# Patient Record
Sex: Female | Born: 1940 | Race: Black or African American | Hispanic: No | Smoking: Current every day smoker
Health system: Southern US, Community
[De-identification: ages and names within clinical notes are randomized; demographics above are authoritative.]

## PROBLEM LIST (undated history)

## (undated) ENCOUNTER — Emergency Department (HOSPITAL_COMMUNITY): Admission: EM | Disposition: A | Discharge status: Home Health | Payer: Medicare Other

## (undated) DIAGNOSIS — G4700 Insomnia, unspecified: Secondary | ICD-10-CM

## (undated) DIAGNOSIS — K219 Gastro-esophageal reflux disease without esophagitis: Secondary | ICD-10-CM

## (undated) DIAGNOSIS — R7302 Impaired glucose tolerance (oral): Secondary | ICD-10-CM

## (undated) DIAGNOSIS — E041 Nontoxic single thyroid nodule: Secondary | ICD-10-CM

## (undated) DIAGNOSIS — M19049 Primary osteoarthritis, unspecified hand: Secondary | ICD-10-CM

## (undated) DIAGNOSIS — F411 Generalized anxiety disorder: Secondary | ICD-10-CM

## (undated) DIAGNOSIS — R079 Chest pain, unspecified: Secondary | ICD-10-CM

## (undated) DIAGNOSIS — J4489 Other specified chronic obstructive pulmonary disease: Secondary | ICD-10-CM

## (undated) DIAGNOSIS — I2510 Atherosclerotic heart disease of native coronary artery without angina pectoris: Secondary | ICD-10-CM

## (undated) DIAGNOSIS — M549 Dorsalgia, unspecified: Secondary | ICD-10-CM

## (undated) DIAGNOSIS — J209 Acute bronchitis, unspecified: Secondary | ICD-10-CM

## (undated) DIAGNOSIS — E739 Lactose intolerance, unspecified: Secondary | ICD-10-CM

## (undated) DIAGNOSIS — M79604 Pain in right leg: Secondary | ICD-10-CM

## (undated) DIAGNOSIS — R55 Syncope and collapse: Secondary | ICD-10-CM

## (undated) DIAGNOSIS — M545 Low back pain, unspecified: Secondary | ICD-10-CM

## (undated) DIAGNOSIS — I739 Peripheral vascular disease, unspecified: Secondary | ICD-10-CM

## (undated) DIAGNOSIS — R5381 Other malaise: Secondary | ICD-10-CM

## (undated) DIAGNOSIS — R42 Dizziness and giddiness: Secondary | ICD-10-CM

## (undated) DIAGNOSIS — E876 Hypokalemia: Secondary | ICD-10-CM

## (undated) DIAGNOSIS — K5900 Constipation, unspecified: Secondary | ICD-10-CM

## (undated) DIAGNOSIS — I6529 Occlusion and stenosis of unspecified carotid artery: Secondary | ICD-10-CM

## (undated) DIAGNOSIS — J449 Chronic obstructive pulmonary disease, unspecified: Secondary | ICD-10-CM

## (undated) DIAGNOSIS — M810 Age-related osteoporosis without current pathological fracture: Secondary | ICD-10-CM

## (undated) DIAGNOSIS — IMO0001 Reserved for inherently not codable concepts without codable children: Secondary | ICD-10-CM

## (undated) DIAGNOSIS — M79605 Pain in left leg: Principal | ICD-10-CM

## (undated) DIAGNOSIS — R5383 Other fatigue: Secondary | ICD-10-CM

## (undated) DIAGNOSIS — E785 Hyperlipidemia, unspecified: Secondary | ICD-10-CM

## (undated) DIAGNOSIS — J309 Allergic rhinitis, unspecified: Secondary | ICD-10-CM

## (undated) DIAGNOSIS — M79609 Pain in unspecified limb: Secondary | ICD-10-CM

## (undated) DIAGNOSIS — I10 Essential (primary) hypertension: Secondary | ICD-10-CM

## (undated) HISTORY — DX: Low back pain: M54.5

## (undated) HISTORY — DX: Hypokalemia: E87.6

## (undated) HISTORY — DX: Generalized anxiety disorder: F41.1

## (undated) HISTORY — DX: Chest pain, unspecified: R07.9

## (undated) HISTORY — DX: Allergic rhinitis, unspecified: J30.9

## (undated) HISTORY — DX: Nontoxic single thyroid nodule: E04.1

## (undated) HISTORY — DX: Essential (primary) hypertension: I10

## (undated) HISTORY — DX: Gastro-esophageal reflux disease without esophagitis: K21.9

## (undated) HISTORY — DX: Low back pain, unspecified: M54.50

## (undated) HISTORY — DX: Impaired glucose tolerance (oral): R73.02

## (undated) HISTORY — DX: Peripheral vascular disease, unspecified: I73.9

## (undated) HISTORY — DX: Age-related osteoporosis without current pathological fracture: M81.0

## (undated) HISTORY — DX: Dorsalgia, unspecified: M54.9

## (undated) HISTORY — DX: Other malaise: R53.81

## (undated) HISTORY — DX: Syncope and collapse: R55

## (undated) HISTORY — DX: Other fatigue: R53.83

## (undated) HISTORY — DX: Constipation, unspecified: K59.00

## (undated) HISTORY — PX: TONSILLECTOMY: SUR1361

## (undated) HISTORY — DX: Lactose intolerance, unspecified: E73.9

## (undated) HISTORY — PX: OTHER SURGICAL HISTORY: SHX169

## (undated) HISTORY — DX: Pain in left leg: M79.605

## (undated) HISTORY — DX: Primary osteoarthritis, unspecified hand: M19.049

## (undated) HISTORY — DX: Dizziness and giddiness: R42

## (undated) HISTORY — DX: Pain in unspecified limb: M79.609

## (undated) HISTORY — DX: Atherosclerotic heart disease of native coronary artery without angina pectoris: I25.10

## (undated) HISTORY — DX: Other specified chronic obstructive pulmonary disease: J44.89

## (undated) HISTORY — DX: Chronic obstructive pulmonary disease, unspecified: J44.9

## (undated) HISTORY — PX: CAROTID ENDARTERECTOMY: SUR193

## (undated) HISTORY — DX: Hyperlipidemia, unspecified: E78.5

## (undated) HISTORY — DX: Occlusion and stenosis of unspecified carotid artery: I65.29

## (undated) HISTORY — PX: OOPHORECTOMY: SHX86

## (undated) HISTORY — DX: Pain in right leg: M79.604

## (undated) HISTORY — DX: Acute bronchitis, unspecified: J20.9

## (undated) HISTORY — PX: APPENDECTOMY: SHX54

## (undated) HISTORY — PX: ABDOMINAL HYSTERECTOMY: SHX81

## (undated) HISTORY — PX: CARDIAC CATHETERIZATION: SHX172

## (undated) HISTORY — DX: Insomnia, unspecified: G47.00

---

## 1999-07-16 ENCOUNTER — Encounter: Payer: Self-pay | Admitting: Emergency Medicine

## 1999-07-16 ENCOUNTER — Inpatient Hospital Stay (HOSPITAL_COMMUNITY): Admission: EM | Admit: 1999-07-16 | Discharge: 1999-07-26 | Payer: Self-pay | Admitting: Emergency Medicine

## 1999-07-18 ENCOUNTER — Encounter: Payer: Self-pay | Admitting: Internal Medicine

## 2003-02-04 ENCOUNTER — Inpatient Hospital Stay (HOSPITAL_COMMUNITY): Admission: EM | Admit: 2003-02-04 | Discharge: 2003-02-06 | Payer: Self-pay | Admitting: Emergency Medicine

## 2003-06-23 ENCOUNTER — Emergency Department (HOSPITAL_COMMUNITY): Admission: EM | Admit: 2003-06-23 | Discharge: 2003-06-23 | Payer: Self-pay | Admitting: Emergency Medicine

## 2004-03-14 ENCOUNTER — Ambulatory Visit: Payer: Self-pay | Admitting: Internal Medicine

## 2005-10-05 ENCOUNTER — Ambulatory Visit: Payer: Self-pay | Admitting: Internal Medicine

## 2005-10-10 ENCOUNTER — Ambulatory Visit: Payer: Self-pay | Admitting: Internal Medicine

## 2005-11-06 ENCOUNTER — Ambulatory Visit: Payer: Self-pay | Admitting: Internal Medicine

## 2006-11-07 ENCOUNTER — Encounter: Payer: Self-pay | Admitting: *Deleted

## 2006-11-14 ENCOUNTER — Telehealth: Payer: Self-pay | Admitting: Internal Medicine

## 2007-02-12 ENCOUNTER — Ambulatory Visit: Payer: Self-pay | Admitting: Internal Medicine

## 2007-02-13 LAB — CONVERTED CEMR LAB
ALT: 14 units/L (ref 0–35)
AST: 21 units/L (ref 0–37)
Albumin: 4.1 g/dL (ref 3.5–5.2)
Alkaline Phosphatase: 58 units/L (ref 39–117)
BUN: 13 mg/dL (ref 6–23)
Calcium: 9.5 mg/dL (ref 8.4–10.5)
Chloride: 103 meq/L (ref 96–112)
Cholesterol: 253 mg/dL (ref 0–200)
Eosinophils Absolute: 0.2 10*3/uL (ref 0.0–0.6)
GFR calc Af Amer: 81 mL/min
GFR calc non Af Amer: 67 mL/min
HDL: 104.4 mg/dL (ref >39.0)
Lymphocytes Relative: 22.8 % (ref 12.0–46.0)
MCV: 91 fL (ref 78.0–100.0)
Monocytes Relative: 7.4 % (ref 3.0–11.0)
Neutro Abs: 4.8 10*3/uL (ref 1.4–7.7)
Platelets: 180 10*3/uL (ref 150–400)
RBC: 4.48 M/uL (ref 3.87–5.11)
Triglycerides: 97 mg/dL (ref 0–149)
VLDL: 19 mg/dL (ref 0–40)
WBC: 7.1 10*3/uL (ref 4.5–10.5)

## 2007-02-14 LAB — CONVERTED CEMR LAB: Vit D, 1,25-Dihydroxy: 21 — ABNORMAL LOW (ref 30–89)

## 2007-02-20 ENCOUNTER — Ambulatory Visit: Payer: Self-pay

## 2007-02-20 ENCOUNTER — Encounter: Payer: Self-pay | Admitting: Cardiology

## 2007-02-20 ENCOUNTER — Encounter: Payer: Self-pay | Admitting: Internal Medicine

## 2007-02-20 LAB — CONVERTED CEMR LAB
BUN: 14 mg/dL (ref 6–23)
Calcium: 10 mg/dL (ref 8.4–10.5)
Chloride: 94 meq/L — ABNORMAL LOW (ref 96–112)
Creatinine, Ser: 0.9 mg/dL (ref 0.4–1.2)
Potassium: 3.6 meq/L (ref 3.5–5.1)

## 2007-02-24 ENCOUNTER — Telehealth: Payer: Self-pay | Admitting: Internal Medicine

## 2007-02-26 ENCOUNTER — Encounter: Payer: Self-pay | Admitting: Internal Medicine

## 2007-02-26 ENCOUNTER — Ambulatory Visit: Payer: Self-pay | Admitting: Vascular Surgery

## 2007-03-02 HISTORY — PX: OTHER SURGICAL HISTORY: SHX169

## 2007-03-04 ENCOUNTER — Encounter: Payer: Self-pay | Admitting: Vascular Surgery

## 2007-03-04 ENCOUNTER — Ambulatory Visit: Payer: Self-pay | Admitting: Vascular Surgery

## 2007-03-04 ENCOUNTER — Inpatient Hospital Stay (HOSPITAL_COMMUNITY): Admission: RE | Admit: 2007-03-04 | Discharge: 2007-03-05 | Payer: Self-pay | Admitting: Vascular Surgery

## 2007-04-04 ENCOUNTER — Encounter: Payer: Self-pay | Admitting: Internal Medicine

## 2007-04-04 ENCOUNTER — Ambulatory Visit: Payer: Self-pay | Admitting: Vascular Surgery

## 2007-04-10 ENCOUNTER — Encounter: Payer: Self-pay | Admitting: Internal Medicine

## 2007-04-10 ENCOUNTER — Ambulatory Visit: Payer: Self-pay | Admitting: Internal Medicine

## 2007-05-08 ENCOUNTER — Ambulatory Visit: Payer: Self-pay | Admitting: Internal Medicine

## 2007-06-09 ENCOUNTER — Telehealth (INDEPENDENT_AMBULATORY_CARE_PROVIDER_SITE_OTHER): Payer: Self-pay | Admitting: *Deleted

## 2007-08-04 ENCOUNTER — Ambulatory Visit: Payer: Self-pay | Admitting: Internal Medicine

## 2007-08-04 LAB — CONVERTED CEMR LAB
AST: 22 units/L (ref 0–37)
Albumin: 4 g/dL (ref 3.5–5.2)
Cholesterol: 202 mg/dL (ref 0–200)
Direct LDL: 66.2 mg/dL
HDL: 111.4 mg/dL (ref >39.0)
Total CHOL/HDL Ratio: 1.8
Triglycerides: 56 mg/dL (ref 0–149)

## 2007-09-24 ENCOUNTER — Telehealth (INDEPENDENT_AMBULATORY_CARE_PROVIDER_SITE_OTHER): Payer: Self-pay | Admitting: *Deleted

## 2007-09-26 ENCOUNTER — Inpatient Hospital Stay (HOSPITAL_COMMUNITY): Admission: EM | Admit: 2007-09-26 | Discharge: 2007-09-27 | Payer: Self-pay | Admitting: Emergency Medicine

## 2007-09-26 ENCOUNTER — Ambulatory Visit: Payer: Self-pay | Admitting: Internal Medicine

## 2007-10-10 ENCOUNTER — Ambulatory Visit: Payer: Self-pay | Admitting: Internal Medicine

## 2007-10-10 LAB — CONVERTED CEMR LAB
ALT: 17 units/L (ref 0–35)
AST: 23 units/L (ref 0–37)
Bilirubin, Direct: 0.1 mg/dL (ref 0.0–0.3)
Cholesterol: 198 mg/dL (ref 0–200)
HDL: 75.7 mg/dL (ref >39.0)
Total Bilirubin: 0.6 mg/dL (ref 0.3–1.2)

## 2007-10-17 ENCOUNTER — Ambulatory Visit: Payer: Self-pay | Admitting: Vascular Surgery

## 2007-11-04 ENCOUNTER — Ambulatory Visit: Payer: Self-pay | Admitting: Internal Medicine

## 2007-11-27 ENCOUNTER — Telehealth (INDEPENDENT_AMBULATORY_CARE_PROVIDER_SITE_OTHER): Payer: Self-pay | Admitting: *Deleted

## 2007-12-05 ENCOUNTER — Ambulatory Visit: Payer: Self-pay | Admitting: Internal Medicine

## 2007-12-05 LAB — CONVERTED CEMR LAB
AST: 25 units/L (ref 0–37)
Alkaline Phosphatase: 46 units/L (ref 39–117)
Chloride: 105 meq/L (ref 96–112)
Cholesterol: 170 mg/dL (ref 0–200)
GFR calc non Af Amer: 48 mL/min
LDL Cholesterol: 78 mg/dL (ref 0–99)
Potassium: 3.9 meq/L (ref 3.5–5.1)
Total Bilirubin: 0.4 mg/dL (ref 0.3–1.2)
Total CHOL/HDL Ratio: 2.1

## 2008-02-17 ENCOUNTER — Telehealth: Payer: Self-pay | Admitting: Internal Medicine

## 2008-03-04 ENCOUNTER — Telehealth (INDEPENDENT_AMBULATORY_CARE_PROVIDER_SITE_OTHER): Payer: Self-pay | Admitting: *Deleted

## 2008-03-04 ENCOUNTER — Ambulatory Visit: Payer: Self-pay | Admitting: Internal Medicine

## 2008-04-22 ENCOUNTER — Ambulatory Visit: Payer: Self-pay | Admitting: Internal Medicine

## 2008-12-10 ENCOUNTER — Ambulatory Visit: Payer: Self-pay | Admitting: Vascular Surgery

## 2009-02-01 ENCOUNTER — Ambulatory Visit: Payer: Self-pay | Admitting: Internal Medicine

## 2009-02-01 LAB — CONVERTED CEMR LAB: HDL goal, serum: 40 mg/dL

## 2009-02-03 LAB — CONVERTED CEMR LAB
Albumin: 3.8 g/dL (ref 3.5–5.2)
Alkaline Phosphatase: 62 units/L (ref 39–117)
Basophils Relative: 0.8 % (ref 0.0–3.0)
Bilirubin Urine: NEGATIVE
CO2: 30 meq/L (ref 19–32)
Chloride: 108 meq/L (ref 96–112)
Direct LDL: 77.3 mg/dL
Eosinophils Absolute: 0.2 10*3/uL (ref 0.0–0.7)
HDL: 118.6 mg/dL (ref >39.00)
Hemoglobin, Urine: NEGATIVE
Hemoglobin: 14 g/dL (ref 12.0–15.0)
Iron: 96 ug/dL (ref 42–145)
Lymphocytes Relative: 23.9 % (ref 12.0–46.0)
MCHC: 33.1 g/dL (ref 30.0–36.0)
MCV: 94.5 fL (ref 78.0–100.0)
Monocytes Absolute: 0.5 10*3/uL (ref 0.1–1.0)
Neutro Abs: 3.6 10*3/uL (ref 1.4–7.7)
RBC: 4.46 M/uL (ref 3.87–5.11)
Saturation Ratios: 29.1 % (ref 20.0–50.0)
Sodium: 143 meq/L (ref 135–145)
Total CHOL/HDL Ratio: 2
Total Protein, Urine: NEGATIVE mg/dL
Total Protein: 7 g/dL (ref 6.0–8.3)
Urine Glucose: NEGATIVE mg/dL
Urobilinogen, UA: 0.2 (ref 0.0–1.0)
VLDL: 24 mg/dL (ref 0.0–40.0)
Vitamin B-12: 251 pg/mL (ref 211–911)

## 2009-02-18 ENCOUNTER — Ambulatory Visit: Payer: Self-pay

## 2009-02-18 ENCOUNTER — Encounter: Payer: Self-pay | Admitting: Internal Medicine

## 2009-03-02 ENCOUNTER — Encounter: Admission: RE | Admit: 2009-03-02 | Discharge: 2009-03-02 | Payer: Self-pay | Admitting: Internal Medicine

## 2009-03-02 ENCOUNTER — Encounter: Payer: Self-pay | Admitting: Internal Medicine

## 2009-03-03 ENCOUNTER — Telehealth (INDEPENDENT_AMBULATORY_CARE_PROVIDER_SITE_OTHER): Payer: Self-pay | Admitting: *Deleted

## 2009-03-03 ENCOUNTER — Ambulatory Visit: Payer: Self-pay | Admitting: Internal Medicine

## 2009-03-17 ENCOUNTER — Telehealth (INDEPENDENT_AMBULATORY_CARE_PROVIDER_SITE_OTHER): Payer: Self-pay | Admitting: *Deleted

## 2009-03-17 ENCOUNTER — Encounter: Payer: Self-pay | Admitting: Internal Medicine

## 2009-03-26 ENCOUNTER — Emergency Department (HOSPITAL_COMMUNITY): Admission: EM | Admit: 2009-03-26 | Discharge: 2009-03-26 | Payer: Self-pay | Admitting: Emergency Medicine

## 2009-03-28 ENCOUNTER — Telehealth: Payer: Self-pay | Admitting: Internal Medicine

## 2009-03-29 HISTORY — PX: OTHER SURGICAL HISTORY: SHX169

## 2009-05-10 ENCOUNTER — Encounter: Payer: Self-pay | Admitting: Internal Medicine

## 2009-12-05 ENCOUNTER — Ambulatory Visit: Payer: Self-pay | Admitting: Internal Medicine

## 2010-01-12 ENCOUNTER — Telehealth (INDEPENDENT_AMBULATORY_CARE_PROVIDER_SITE_OTHER): Payer: Self-pay | Admitting: *Deleted

## 2010-02-06 ENCOUNTER — Ambulatory Visit: Admit: 2010-02-06 | Payer: Self-pay | Admitting: Internal Medicine

## 2010-02-19 ENCOUNTER — Encounter: Payer: Self-pay | Admitting: Internal Medicine

## 2010-03-01 ENCOUNTER — Encounter: Payer: Self-pay | Admitting: Internal Medicine

## 2010-03-01 ENCOUNTER — Other Ambulatory Visit: Payer: Self-pay | Admitting: Internal Medicine

## 2010-03-01 ENCOUNTER — Telehealth (INDEPENDENT_AMBULATORY_CARE_PROVIDER_SITE_OTHER): Payer: Self-pay | Admitting: *Deleted

## 2010-03-01 DIAGNOSIS — E041 Nontoxic single thyroid nodule: Secondary | ICD-10-CM

## 2010-03-02 NOTE — Progress Notes (Signed)
Summary: CALL  Phone Note Call from Patient   Summary of Call: Pt left vm, she went to the ER and would like a call back.  Initial call taken by: Lamar Sprinkles, CMA,  March 28, 2009 9:56 AM  Follow-up for Phone Call        pt wanted to let MD know that she was seen in [facility] ER over the weekend for viral infection that caused N&V with diarrhea. Follow-up by: Margaret Pyle, CMA,  March 28, 2009 10:23 AM  Additional Follow-up for Phone Call Additional follow up Details #1::        noted; please call or make OV for any worsening related  symptoms Additional Follow-up by: Corwin Levins MD,  March 28, 2009 12:05 PM

## 2010-03-02 NOTE — Miscellaneous (Signed)
Summary: Orders Update  Clinical Lists Changes  Orders: Added new Referral order of Misc. Referral (Misc. Ref) - Signed 

## 2010-03-02 NOTE — Assessment & Plan Note (Signed)
Summary: FU / REFILLS / LEG PROBLEMS/NWS #   Vital Signs:  Patient profile:   70 year old female Height:      66 inches Weight:      124 pounds BMI:     20.09 O2 Sat:      99 % on Room air Temp:     98 degrees F oral Pulse rate:   73 / minute BP sitting:   198 / 90  (left arm) Cuff size:   regular  Vitals Entered ByZella Ball Ewing (February 01, 2009 8:41 AM)  O2 Flow:  Room air  Preventive Care Screening  Bone Density:    Date:  03/31/2007    Next Due:  03/2009    Results:  abnormal std dev  Last Tetanus Booster:    Date:  02/01/2009    Results:  Td  Last Flu Shot:    Date:  02/01/2009    Results:  Fluvax 3+  CC: followup on medication/RE, Lipid Management   CC:  followup on medication/RE and Lipid Management.  History of Present Illness: saw [facility] orthopedic with severe BP;  gained just a few lbs, has ongoing pain to the first finger right hand only and no other pain but pain overall still quite significant;  had cortisone shot to joint (dr Maylon Peppers);  has not yet set up her dxa as previsously discussed;  wants to do now as she has had more lower back pain, "across the back" , worse for 1 -2 wks;  about 5/10, worse to stand up , bend and twise;  no bowel or bladder changes, fever, wt loss, night sweats and no GI or GU ymptoms, and no LE pain, weakness , or numbness. After discussion today, we find she is taking the clonidine only, on her own, and not taking the hctz and toprol due to "so many pills" and cost.  she' not sure if she is taking the simvatstatin as well, and does not appear to have signifcant coginitive difficulty today or memory issues.   Does need pain med (darvocet no longer on the [place] market) for the chronic recurrent leg pains at rest.  also having leg pain with walking, and not clearly not the knees , but points to the legs below the knees, with some stiffness that also make it hard to get up in the AM.  Pt denies CP, sob, doe, wheezing, orthopnea, pnd,  worsening LE edema, palps, dizziness or syncope   Pt denies new neuro symptoms such as headache, facial or extremity weakness   Lipid Management History:      Positive NCEP/ATP III risk factors include female age 15 years old or older, current tobacco user, hypertension, ASHD (either angina/prior MI/prior CABG), and peripheral vascular disease.  Negative NCEP/ATP III risk factors include HDL cholesterol greater than 60.    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) 3)  Glucose Intolerance  (ICD-271.3) 4)  Hypokalemia  (ICD-276.8) 5)  Peripheral Vascular Disease  (ICD-443.9) 6)  Coronary Artery Disease  (ICD-414.00) 7)  COPD  (ICD-496) 8)  Insomnia-sleep Disorder-unspec  (ICD-780.52) 9)  Hepatotoxicity, Drug-induced, Risk of  (ICD-V58.69) 10)  Carotid Artery Stenosis, Bilateral  (ICD-433.10) 11)  Allergic Rhinitis  (ICD-477.9) 12)  Hyperlipidemia  (ICD-272.4) 13)  Fatigue  (ICD-780.79) 14)  Depression  (ICD-311) 15)  Anxiety  (ICD-300.00) 16)  Osteoporosis  (ICD-733.00) 17)  Gerd  (ICD-530.81) 18)  Constipation  (ICD-564.00)  19)  Claudication  (ICD-443.9) 20)  Dizziness  (ICD-780.4) 21)  Chest Pain  (ICD-786.50) 22)  Hypertension  (ICD-401.9)  Medications Prior to Update: 1)  Toprol Xl 50 Mg Xr24h-Tab (Metoprolol Succinate) .Marland Kitchen.. 1 By Mouth Once Daily 2)  Hydrochlorothiazide 25 Mg Tabs (Hydrochlorothiazide) .... Take 1 Tablet By Mouth Once A Day 3)  Meloxicam 15 Mg Tabs (Meloxicam) .Marland Kitchen.. 1 By Mouth Once Daily As Needed 4)  Ecotrin Low Strength 81 Mg  Tbec (Aspirin) .Marland Kitchen.. 1po Qd 5)  Omeprazole 20 Mg  Cpdr (Omeprazole) .... 2po Qd 6)  Simvastatin 80 Mg Tabs (Simvastatin) .Marland Kitchen.. 1 By Mouth Once Daily 7)  Vitamin D 1000 Unit  Tabs (Cholecalciferol) .Marland Kitchen.. 1po Qd 8)  Alprazolam 0.25 Mg  Tabs (Alprazolam) .Marland Kitchen.. 1 By Mouth Two Times A Day Prn 9)  Alendronate Sodium 70 Mg  Tabs (Alendronate Sodium) .Marland Kitchen.. 1 By Mouth  Q Wk 10)  Zolpidem Tartrate 10 Mg  Tabs (Zolpidem Tartrate) .Marland Kitchen.. 1 By Mouth At Bedtime As Needed 11)  Citalopram Hydrobromide 10 Mg  Tabs (Citalopram Hydrobromide) .Marland Kitchen.. 1 By Mouth Once Daily 12)  Clonidine Hcl 0.2 Mg Tabs (Clonidine Hcl) .Marland Kitchen.. 1 By Mouth Two Times A Day 13)  Cephalexin 500 Mg Caps (Cephalexin) .Marland Kitchen.. 1 By Mouth Three Times A Day 14)  Tessalon Perles 100 Mg Caps (Benzonatate) .Marland Kitchen.. 1 - 2 By Mouth Three Times A Day As Needed 15)  Prednisone 10 Mg Tabs (Prednisone) .... 3po Qd For 3days, Then 2po Qd For 3days, Then 1po Qd For 3days, Then Stop  Current Medications (verified): 1)  Atenolol-Chlorthalidone 50-25 Mg Tabs (Atenolol-Chlorthalidone) .Marland Kitchen.. 1po Once Daily 2)  Tramadol Hcl 50 Mg Tabs (Tramadol Hcl) .Marland Kitchen.. 1po Q 6 Hrs As Needed Pain 3)  Ecotrin Low Strength 81 Mg  Tbec (Aspirin) .Marland Kitchen.. 1po Qd 4)  Omeprazole 20 Mg  Cpdr (Omeprazole) .... 2 By Mouth Once Daily 5)  Simvastatin 40 Mg Tabs (Simvastatin) .Marland Kitchen.. 1po Once Daily 6)  Vitamin D 1000 Unit  Tabs (Cholecalciferol) .Marland Kitchen.. 1po Qd 7)  Alprazolam 0.25 Mg  Tabs (Alprazolam) .Marland Kitchen.. 1 By Mouth Two Times A Day Prn 8)  Alendronate Sodium 70 Mg  Tabs (Alendronate Sodium) .Marland Kitchen.. 1 By Mouth Q Wk 9)  Zolpidem Tartrate 10 Mg  Tabs (Zolpidem Tartrate) .Marland Kitchen.. 1 By Mouth At Bedtime As Needed 10)  Clonidine Hcl 0.2 Mg Tabs (Clonidine Hcl) .Marland Kitchen.. 1 By Mouth Two Times A Day  Allergies (verified): No Known Drug Allergies  Past History:  Past Medical History: Last updated: 11/04/2007 HYPERTENSION (ICD-401.9) GERD Osteoporosis Anxiety Depression Hyperlipidemia left knee djd non-compliance Allergic rhinitis COPD Coronary artery disease - nonobstructive by cath 9/09 glucose intolerance Peripheral vascular disease  - left carotid 60-80% 9/09  Past Surgical History: Last updated: 11/04/2007 s/p small bowell obstruction Hysterectomy Appendectomy Oophorectomy Tonsillectomy s/p right CEA - 2/09  Family History: Last updated:  03/04/2008 stroke MI alcoholism HTN DM son with colon cancer  Social History: Last updated: 11/04/2007 Current Smoker Alcohol use-no widow 2 children 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   Head:  normocephalic and atraumatic.   Eyes:  vision grossly intact, pupils equal, and pupils round.   Ears:  R ear normal and L ear normal.  Nose:  no external deformity and no nasal discharge.   Mouth:  no gingival abnormalities and pharynx pink and moist.   Neck:  supple and no masses.   Lungs:  normal respiratory effort, R decreased breath sounds, and L decreased breath sounds.   Heart:  normal rate and regular rhythm.   Msk:  no joint tenderness and no joint swelling.   Pulses:  trace to 1+ bilat LE dorsalis pedis Extremities:  no edema, no erythema  Neurologic:  cranial nerves II-XII intact and strength normal in all extremities.     Impression & Recommendations:  Problem # 1:  HYPERTENSION (ICD-401.9)  The following medications were removed from the medication list:    Toprol Xl 50 Mg Xr24h-tab (Metoprolol succinate) .Marland Kitchen... 1 by mouth once daily Her updated medication list for this problem includes:    Atenolol-chlorthalidone 50-25 Mg Tabs (Atenolol-chlorthalidone) .Marland Kitchen... 1po once daily    Clonidine Hcl 0.2 Mg Tabs (Clonidine hcl) .Marland Kitchen... 1 by mouth two times a day persistent elev due in part to noncompliance with meds; meds adjusted to hopefully meds she will continue;  urged compliacne, to chekc renal artery u/s, and f/u 4 wks  Orders: Radiology Referral (Radiology)  Problem # 2:  FATIGUE (ICD-780.79)  /exam benign, to check labs below; follow with expectant management    Orders: TLB-BMP (Basic Metabolic Panel-BMET) (80048-METABOL) TLB-CBC Platelet - w/Differential (85025-CBCD) TLB-Hepatic/Liver Function Pnl (80076-HEPATIC) TLB-TSH (Thyroid Stimulating Hormone) (84443-TSH) TLB-Sedimentation Rate (ESR) (85652-ESR) TLB-IBC Pnl (Iron/FE;Transferrin) (83550-IBC) TLB-B12 + Folate Pnl (82746_82607-B12/FOL) TLB-Udip ONLY (81003-UDIP)  Problem # 3:  LEG PAIN, BILATERAL (ICD-729.5)  not primarily joint related it seems; will check LE dopplers, but also consider EMG/NCS  Orders: Radiology Referral (Radiology)  Problem # 4:  LOW BACK PAIN, CHRONIC (ICD-724.2)  Her updated medication list for this problem includes:    Tramadol Hcl 50 Mg Tabs (Tramadol hcl) .Marland Kitchen... 1po q 6 hrs as needed pain    Ecotrin Low Strength 81 Mg Tbec (Aspirin) .Marland Kitchen... 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 1po once daily 2)  Tramadol Hcl 50 Mg Tabs (Tramadol hcl) .Marland Kitchen.. 1po q 6 hrs as needed pain 3)  Ecotrin Low Strength 81 Mg Tbec (Aspirin) .Marland Kitchen.. 1po qd 4)  Omeprazole 20 Mg Cpdr (Omeprazole) .... 2 by mouth once daily 5)  Simvastatin 40 Mg Tabs (Simvastatin) .Marland Kitchen.. 1po once daily 6)  Vitamin D 1000 Unit Tabs (Cholecalciferol) .Marland Kitchen.. 1po qd 7)  Alprazolam 0.25 Mg Tabs (Alprazolam) .Marland Kitchen.. 1 by mouth two times a day prn 8)  Alendronate Sodium 70 Mg Tabs (Alendronate sodium) .Marland Kitchen.. 1 by mouth q wk 9)  Zolpidem Tartrate 10 Mg Tabs (Zolpidem tartrate) .Marland Kitchen.. 1 by mouth at  bedtime as needed 10)  Clonidine Hcl 0.2 Mg Tabs (Clonidine hcl) .Marland Kitchen.. 1 by mouth two times a day  Other Orders: Flu Vaccine 70yrs + (16109) Administration Flu vaccine - MCR (G0008) TD Toxoids IM 7 YR + (60454) Admin 1st Vaccine (09811) Admin of Any Addtl Vaccine (  16606)  Lipid Assessment/Plan:      Based on NCEP/ATP III, the patient's risk factor category is "history of coronary disease, peripheral vascular disease, cerebrovascular disease, or aortic aneurysm along with either diabetes, current smoker, or LDL > 130 plus HDL < 40 plus triglycerides > 200".  The patient's lipid goals are as follows: Total cholesterol goal is 200; LDL cholesterol goal is 70; HDL cholesterol goal is 40; Triglyceride goal is 150.     Patient Instructions: 1)  you had the flu shot and the tetanus shot 2)  Please go to the Lab in the basement for your blood and/or urine tests today 3)  You will be contacted about the referral(s) to: Thyroid u/s, renal artery u/s, and the leg arteries u/s 4)  Please take all new medications as prescribed  5)  Continue all previous medications as before this visit  (see the list below and make sure you understand and take all mediciations; call if you have questions) 6)  most of your medications were refilled today with one month refills; call if you need the other medications refilled as well 7)  Please schedule a follow-up appointment in 1 month. Prescriptions: CLONIDINE HCL 0.2 MG TABS (CLONIDINE HCL) 1 by mouth two times a day  #60 x 11   Entered and Authorized by:   Corwin Levins MD   Signed by:   Corwin Levins MD on 02/01/2009   Method used:   Print then Give to Patient   RxID:   [id number] ALENDRONATE SODIUM 70 MG  TABS (ALENDRONATE SODIUM) 1 by mouth q wk  #5 x 11   Entered and Authorized by:   Corwin Levins MD   Signed by:   Corwin Levins MD on 02/01/2009   Method used:   Print then Give to Patient   RxID:   [id number] SIMVASTATIN 40 MG TABS (SIMVASTATIN) 1po  once daily  #30 x 11   Entered and Authorized by:   Corwin Levins MD   Signed by:   Corwin Levins MD on 02/01/2009   Method used:   Print then Give to Patient   RxID:   [id number] OMEPRAZOLE 20 MG  CPDR (OMEPRAZOLE) 2 by mouth once daily  #60 x 11   Entered and Authorized by:   Corwin Levins MD   Signed by:   Corwin Levins MD on 02/01/2009   Method used:   Print then Give to Patient   RxID:   [id number] TRAMADOL HCL 50 MG TABS (TRAMADOL HCL) 1po q 6 hrs as needed pain  #120 x 2   Entered and Authorized by:   Corwin Levins MD   Signed by:   Corwin Levins MD on 02/01/2009   Method used:   Print then Give to Patient   RxID:   [id number] ATENOLOL-CHLORTHALIDONE 50-25 MG TABS (ATENOLOL-CHLORTHALIDONE) 1po once daily  #30 x 11   Entered and Authorized by:   Corwin Levins MD   Signed by:   Corwin Levins MD on 02/01/2009   Method used:   Print then Give to Patient   RxID:   [id number]    Flu Vaccine Consent Questions     Do you have a history of severe allergic reactions to this vaccine? no    Any prior history of allergic reactions to egg and/or gelatin? no    Do you have a sensitivity to the preservative Thimersol? no    Do you have a  past history of Guillan-Barre Syndrome? no    Do you currently have an acute febrile illness? no    Have you ever had a severe reaction to latex? no    Vaccine information given and explained to patient? yes    Are you currently pregnant? no    Lot Number:[id number]   Exp Date:07/28/2009   Site Given  Left Deltoid IMdflu  Immunizations Administered:  Tetanus Vaccine:    Vaccine Type: Td    Site: right deltoid    Mfr: Sanofi Pasteur    Dose: 0.5 ml    Route: IM    Given by: Zella Ball Ewing    Exp. Date: 11/23/2010    Lot #: [id number]    VIS given: 12/17/06 version given February 01, 2009.

## 2010-03-02 NOTE — Assessment & Plan Note (Signed)
Summary: FU [name]   Vital Signs:  Patient profile:   70 year old female Height:      64 inches Weight:      117.13 pounds BMI:     20.18 O2 Sat:      98 % on Room air Temp:     98.6 degrees F oral Pulse rate:   74 / minute BP sitting:   130 / 62  (left arm) Cuff size:   regular  Vitals Entered By: Zella Ball Ewing CMA Duncan Dull) (December 05, 2009 9:17 AM)  O2 Flow:  Room air CC: followup/RE   CC:  followup/RE.  History of Present Illness: here with worsening pain to the right knee and calf area , graudually worse over past yr, now to the point in the past few months she has several instanaces of giving away, but no falls, alos with right lower back pain, and left shoulder pain on different days and not clear the back is related to the lower right leg pain;  no bowel or bladder changes, no falls, No fever, wt loss, night sweats, loss of appetite or other constitutional symptoms  Tramadol not working for pain, alhtough does make her sleepy.  needs jury duty letter due to back and leg pain ongoing  BP has been elevated at home  150's;  worse with stress, more stress lately  and panic attacks several lately where she had to stop to catch her breath , only in a stressful situaations related to sons behavior and ETOH use.  Pt denies other CP, worsening sob, doe, wheezing, orthopnea, pnd, worsening LE edema, palps, dizziness or syncope Pt denies new neuro symptoms such as headache, facial or extremity weakness  Pt denies polydipsia, polyuria, or depressive symtpoms or suicidal ideation.   Preventive Screening-Counseling & Management      Drug Use:  no.    Problems Prior to Update: 1)  Back Pain, Right  (ICD-724.5) 2)  Preventive Health Care  (ICD-V70.0) 3)  Preventive Health Care  (ICD-V70.0) 4)  Thyroid Nodule, Right  (ICD-241.0) 5)  Low Back Pain, Chronic  (ICD-724.2) 6)  Leg Pain, Bilateral  (ICD-729.5) 7)  Fatigue  (ICD-780.79) 8)  Asthmatic Bronchitis, Acute  (ICD-466.0) 9)   Degenerative Joint Disease, Fingers  (ICD-715.94) 10)  Glucose Intolerance  (ICD-271.3) 11)  Hypokalemia  (ICD-276.8) 12)  Peripheral Vascular Disease  (ICD-443.9) 13)  Coronary Artery Disease  (ICD-414.00) 14)  COPD  (ICD-496) 15)  Insomnia-sleep Disorder-unspec  (ICD-780.52) 16)  Hepatotoxicity, Drug-induced, Risk of  (ICD-V58.69) 17)  Carotid Artery Stenosis, Bilateral  (ICD-433.10) 18)  Allergic Rhinitis  (ICD-477.9) 19)  Hyperlipidemia  (ICD-272.4) 20)  Fatigue  (ICD-780.79) 21)  Depression  (ICD-311) 22)  Anxiety  (ICD-300.00) 23)  Osteoporosis  (ICD-733.00) 24)  Gerd  (ICD-530.81) 25)  Constipation  (ICD-564.00) 26)  Claudication  (ICD-443.9) 27)  Dizziness  (ICD-780.4) 28)  Chest Pain  (ICD-786.50) 29)  Hypertension  (ICD-401.9)  Medications Prior to Update: 1)  Atenolol-Chlorthalidone 50-25 Mg Tabs (Atenolol-Chlorthalidone) .Marland Kitchen.. 1po Once Daily 2)  Tramadol Hcl 50 Mg Tabs (Tramadol Hcl) .Marland Kitchen.. 1po Q 6 Hrs As Needed Pain 3)  Ecotrin Low Strength 81 Mg  Tbec (Aspirin) .Marland Kitchen.. 1po Qd 4)  Omeprazole 20 Mg  Cpdr (Omeprazole) .... 2 By Mouth Once Daily 5)  Simvastatin 40 Mg Tabs (Simvastatin) .Marland Kitchen.. 1po Once Daily 6)  Vitamin D 1000 Unit  Tabs (Cholecalciferol) .Marland Kitchen.. 1po Qd 7)  Alprazolam 0.25 Mg  Tabs (Alprazolam) .Marland Kitchen.. 1 By Mouth Two  Times A Day Prn 8)  Alendronate Sodium 70 Mg  Tabs (Alendronate Sodium) .Marland Kitchen.. 1 By Mouth Q Wk 9)  Zolpidem Tartrate 10 Mg  Tabs (Zolpidem Tartrate) .Marland Kitchen.. 1 By Mouth At Bedtime As Needed 10)  Clonidine Hcl 0.2 Mg Tabs (Clonidine Hcl) .Marland Kitchen.. 1 By Mouth Two Times A Day  Current Medications (verified): 1)  Atenolol-Chlorthalidone 50-25 Mg Tabs (Atenolol-Chlorthalidone) .Marland Kitchen.. 1po Once Daily 2)  Hydrocodone-Acetaminophen 5-325 Mg Tabs (Hydrocodone-Acetaminophen) .Marland Kitchen.. 1po Q 6 Hrs As Needed Pain 3)  Ecotrin Low Strength 81 Mg  Tbec (Aspirin) .Marland Kitchen.. 1po Qd 4)  Simvastatin 40 Mg Tabs (Simvastatin) .Marland Kitchen.. 1po Once Daily 5)  Vitamin D 1000 Unit  Tabs (Cholecalciferol) .Marland Kitchen..  1po Qd 6)  Alprazolam 0.25 Mg  Tabs (Alprazolam) .Marland Kitchen.. 1 By Mouth Two Times A Day As Needed 7)  Alendronate Sodium 70 Mg  Tabs (Alendronate Sodium) .Marland Kitchen.. 1 By Mouth Q Wk 8)  Zolpidem Tartrate 10 Mg  Tabs (Zolpidem Tartrate) .Marland Kitchen.. 1 By Mouth At Bedtime As Needed 9)  Clonidine Hcl 0.2 Mg Tabs (Clonidine Hcl) .Marland Kitchen.. 1 By Mouth Two Times A Day 10)  Citalopram Hydrobromide 10 Mg Tabs (Citalopram Hydrobromide) .Marland Kitchen.. 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   Head:  normocephalic and atraumatic.   Eyes:  vision grossly intact, pupils equal, and pupils round.   Ears:  R ear normal and L ear normal.   Nose:  no external deformity and no nasal discharge.   Mouth:  no gingival abnormalities and pharynx pink and moist.   Neck:  supple and no masses.   Lungs:  normal respiratory effort, R decreased breath sounds, and L decreased breath sounds.   Heart:  normal rate and  regular rhythm.   Msk:  no joint tenderness and no joint swelling.  but bony chronic right knee changes noted;  spine nontender today and no paravertebral tender Extremities:  no edema, no erythema  Neurologic:  strength normal in all extremities and DTRs symmetrical and normal.   Skin:  no rashes.   Psych:  not depressed appearing and moderately anxious.     Impression & Recommendations:  Problem # 1:  BACK PAIN, RIGHT (ICD-724.5)  Her updated medication list for this problem includes:    Hydrocodone-acetaminophen 5-325 Mg Tabs (Hydrocodone-acetaminophen) .Marland Kitchen... 1po q 6 hrs as needed pain    Ecotrin Low Strength 81 Mg Tbec (Aspirin) .Marland Kitchen... 1po qd and right leg giving away - for ortho referral  Orders: Orthopedic Surgeon Referral (Ortho Surgeon)  Problem # 2:  ANXIETY (ICD-300.00)  Her updated medication list for this problem includes:  Alprazolam 0.25 Mg Tabs (Alprazolam) .Marland Kitchen... 1 by mouth two times a day as needed    Citalopram Hydrobromide 10 Mg Tabs (Citalopram hydrobromide) .Marland Kitchen... 1po once daily ok to start the citalopram 10 mg ; f/u next visit  Problem # 3:  HYPERTENSION (ICD-401.9)  Her updated medication list for this problem includes:    Atenolol-chlorthalidone 50-25 Mg Tabs (Atenolol-chlorthalidone) .Marland Kitchen... 1po once daily    Clonidine Hcl 0.2 Mg Tabs (Clonidine hcl) .Marland Kitchen... 1 by mouth two times a day  BP today: 130/62 Prior BP: 112/52 (03/03/2009)  Prior 10 Yr Risk Heart Disease: N/A (02/01/2009)  Labs Reviewed: K+: 4.3 (02/03/2009) Creat: : 1.1 (02/03/2009)   Chol: 214 (02/03/2009)   HDL: 118.60 (02/03/2009)   LDL: 78 (12/05/2007)   TG: 120.0 (02/03/2009) stable overall by hx and exam, ok to continue meds/tx as is   Complete Medication List: 1)  Atenolol-chlorthalidone 50-25 Mg Tabs (Atenolol-chlorthalidone) .Marland Kitchen.. 1po once daily 2)  Hydrocodone-acetaminophen 5-325 Mg Tabs (Hydrocodone-acetaminophen) .Marland Kitchen.. 1po q 6 hrs as needed pain 3)  Ecotrin Low Strength 81 Mg Tbec  (Aspirin) .Marland Kitchen.. 1po qd 4)  Simvastatin 40 Mg Tabs (Simvastatin) .Marland Kitchen.. 1po once daily 5)  Vitamin D 1000 Unit Tabs (Cholecalciferol) .Marland Kitchen.. 1po qd 6)  Alprazolam 0.25 Mg Tabs (Alprazolam) .Marland Kitchen.. 1 by mouth two times a day as needed 7)  Alendronate Sodium 70 Mg Tabs (Alendronate sodium) .Marland Kitchen.. 1 by mouth q wk 8)  Zolpidem Tartrate 10 Mg Tabs (Zolpidem tartrate) .Marland Kitchen.. 1 by mouth at bedtime as needed 9)  Clonidine Hcl 0.2 Mg Tabs (Clonidine hcl) .Marland Kitchen.. 1 by mouth two times a day 10)  Citalopram Hydrobromide 10 Mg Tabs (Citalopram hydrobromide) .Marland Kitchen.. 1po once daily  Other Orders: Flu Vaccine 38yrs + MEDICARE PATIENTS (J4782) Administration Flu vaccine - MCR (N5621)  Patient Instructions: 1)  you had the flu shot today 2)  You will be contacted about the referral(s) to: orthopedic 3)  start the citalopram 10 mg per day - 1 per day 4)  stop the tramadol 5)  start the hydrocodone as prescribed for pain 6)  Continue all previous medications as before this visit 7)  Please schedule a follow-up appointment in 2 months. 8)  You are given the jury excuse letter today Prescriptions: ALENDRONATE SODIUM 70 MG  TABS (ALENDRONATE SODIUM) 1 by mouth q wk  #12 x 3   Entered and Authorized by:   Corwin Levins MD   Signed by:   Corwin Levins MD on 12/05/2009   Method used:   Electronically to        [facility]  [address] [phone]* (retail)       [address]       [place], [place]  [address]       Ph: [phone] or [phone]       Fax: [phone]   RxID:   [id number] SIMVASTATIN 40 MG TABS (SIMVASTATIN) 1po once daily  #30 x 11   Entered and Authorized by:   Corwin Levins MD   Signed by:   Corwin Levins MD on 12/05/2009   Method used:   Electronically to        [facility]  [address] [phone]* (retail)       [address]       [place], [place]  [address]       Ph: [phone] or [phone]  Fax: [phone]   RxID:    [id number] HYDROCODONE-ACETAMINOPHEN 5-325 MG TABS (HYDROCODONE-ACETAMINOPHEN) 1po q 6 hrs as needed pain  #60 x 2   Entered and Authorized by:   Corwin Levins MD   Signed by:   Corwin Levins MD on 12/05/2009   Method used:   Print then Give to Patient   RxID:   [id number] ZOLPIDEM TARTRATE 10 MG  TABS (ZOLPIDEM TARTRATE) 1 by mouth at bedtime as needed  #30 x 5   Entered and Authorized by:   Corwin Levins MD   Signed by:   Corwin Levins MD on 12/05/2009   Method used:   Print then Give to Patient   RxID:   [id number] ALPRAZOLAM 0.25 MG  TABS (ALPRAZOLAM) 1 by mouth two times a day as needed  #60 x 2   Entered and Authorized by:   Corwin Levins MD   Signed by:   Corwin Levins MD on 12/05/2009   Method used:   Print then Give to Patient   RxID:   [id number] CLONIDINE HCL 0.2 MG TABS (CLONIDINE HCL) 1 by mouth two times a day  #180 x 3   Entered and Authorized by:   Corwin Levins MD   Signed by:   Corwin Levins MD on 12/05/2009   Method used:   [facility]        [facility]  [address] [phone]* (retail)       [address]       [place], [place]  [address]       Ph: [phone] or [phone]       Fax: [phone]   RxID:   [id number] ATENOLOL-CHLORTHALIDONE 50-25 MG TABS (ATENOLOL-CHLORTHALIDONE) 1po once daily  #90 x 3   Entered and Authorized by:   Corwin Levins MD   Signed by:   Corwin Levins MD on 12/05/2009   Method used:   [facility]        [facility]  [address] [phone]* (retail)       [address]       [place], [place]  [address]       Ph: [phone] or [phone]       Fax: [phone]   RxID:   [id number] CITALOPRAM HYDROBROMIDE 10 MG TABS (CITALOPRAM HYDROBROMIDE) 1po once daily  #90 x 3   Entered and Authorized by:   Corwin Levins MD   Signed by:   Corwin Levins MD on 12/05/2009   Method used:   [facility]        [facility]  [address] [phone]* (retail)       [address]       [place], [place]  [address]       Ph: [phone] or [phone]       Fax: [phone]   RxID:   [id number]    Orders Added: 1)  Flu Vaccine 62yrs + MEDICARE PATIENTS [Q2039] 2)  Administration Flu vaccine - MCR [G0008] 3)  Orthopedic Surgeon Referral [Ortho Surgeon] 4)  Est. Patient Level IV [22979]   Flu Vaccine Consent Questions     Do you have a history of severe allergic reactions to this vaccine? no    Any prior history of allergic reactions to egg and/or gelatin? no  Do you have a sensitivity to the preservative Thimersol? no    Do you have a past history of Guillan-Barre Syndrome? no    Do you currently have an acute febrile illness? no    Have you ever had a severe reaction to latex? no    Vaccine information given and explained to patient? yes    Are you currently pregnant? no    Lot Number:[id number]   Exp Date:07/29/2010   Site Given Right Deltoid [id number]

## 2010-03-02 NOTE — Progress Notes (Signed)
Summary: CAROTID   Phone Note From Other Clinic   Summary of Call: Vascular lab called. Carotid u/s apt that was scheduled needs to be changed to an apt aft 05/10/2009. Pt last had u/s 12/10/2009 (in E-chart), insurance will only cover every 6 mths.  Initial call taken by: Lamar Sprinkles, CMA,  March 17, 2009 1:59 PM  Follow-up for Phone Call        ok - to [facility] Follow-up by: Corwin Levins MD,  March 17, 2009 2:26 PM  Additional Follow-up for Phone Call Additional follow up Details #1::        Pt appt has been reschedule to 05/11/09 @ 9am ([facility]), pt is aware. Additional Follow-up by: Dagoberto Reef,  March 18, 2009 11:48 AM

## 2010-03-02 NOTE — Miscellaneous (Signed)
Summary: Orders Update  Clinical Lists Changes  Orders: Added new Test order of Carotid Duplex (Carotid Duplex) - Signed 

## 2010-03-02 NOTE — Progress Notes (Signed)
----   Converted from flag ---- ---- 03/03/2009 10:04 AM, Edman Circle wrote: appt 2/9 @ 3:00  ---- 03/03/2009 9:38 AM, Dagoberto Reef wrote: Thanks  ---- 03/03/2009 9:17 AM, Dagoberto Reef wrote:   ---- 03/03/2009 9:08 AM, Corwin Levins MD wrote: The following orders have been entered for this patient and placed on Admin Hold:  Type:     Referral       Code:   Radiology Description:   Radiology Referral Order Date:   03/03/2009   Authorized By:   Corwin Levins MD Order #:   [id number] Clinical Notes:   Name of Test or Procedure: carotid dopplers  Of What:  Special Instructions ------------------------------

## 2010-03-02 NOTE — Letter (Signed)
Summary: Generic Letter  [facility]  [address] [place], [place] [address]   Phone: [phone]  Fax: [phone]    12/05/2009  [facility] [address] [place], [place]  [address]  Dear Ms. Covello,       This letter is forwarded to you to excuse you  from jury duty due to your severe, ongoing medical  conditions, including ongoing back and leg pain   and tendency to fall.         Sincerely,   Oliver Barre MD

## 2010-03-02 NOTE — Progress Notes (Signed)
  Phone Note Call from Patient Call back at [facility] Phone [phone]   Caller: Patient Summary of Call: Pt called requesting re-schedule of Ortho appt. Pt says she tried to re-schedule and was told that PCP's office would need to schedule, please advise pt. Initial call taken by: Margaret Pyle, CMA,  January 12, 2010 10:51 AM  Follow-up for Phone Call        called pt to inform she missed her appt and [facility] states she can call and make her own appt since pt wwants to be seen sometime in feb  Follow-up by: Shelbie Proctor,  January 13, 2010 10:15 AM

## 2010-03-02 NOTE — Miscellaneous (Signed)
Summary: Orders Update  Clinical Lists Changes  Orders: Added new Test order of Arterial Duplex Lower Extremity (Arterial Duplex Low) - Signed 

## 2010-03-02 NOTE — Assessment & Plan Note (Signed)
Summary: 1 MO ROV /NWS   Vital Signs:  Patient profile:   70 year old female Height:      65 inches Weight:      127 pounds BMI:     21.21 O2 Sat:      99 % on Room air Temp:     98.1 degrees F oral Pulse rate:   59 / minute BP sitting:   112 / 52  (left arm) Cuff size:   regular  Vitals Entered ByZella Ball Ewing (March 03, 2009 8:20 AM)  O2 Flow:  Room air  CC: 1 Mo ROV/RE   CC:  1 Mo ROV/RE.  History of Present Illness: here with family, overall doing ok, Pt denies CP, sob, doe, wheezing, orthopnea, pnd, worsening LE edema, palps, dizziness or syncope   Pt denies new neuro symptoms such as headache, facial or extremity weakness   Still with LE pain with fairly recent neg vascular w/u.  Overall good compliacne with meds, tolerating well.   Here for wellness Diet: Heart Healthy or DM if diabetic Physical Activities: Sedentary Depression/mood screen: Negative Hearing: Intact bilateral Visual Acuity: Grossly normal ADL's: Capable  Fall Risk: None Home Safety: Good End-of-Life Planning: Advance directive - Full code/I agree   Problems Prior to Update: 1)  Preventive Health Care  (ICD-V70.0) 2)  Thyroid Nodule, Right  (ICD-241.0) 3)  Low Back Pain, Chronic  (ICD-724.2) 4)  Leg Pain, Bilateral  (ICD-729.5) 5)  Fatigue  (ICD-780.79) 6)  Asthmatic Bronchitis, Acute  (ICD-466.0) 7)  Degenerative Joint Disease, Fingers  (ICD-715.94) 8)  Glucose Intolerance  (ICD-271.3) 9)  Hypokalemia  (ICD-276.8) 10)  Peripheral Vascular Disease  (ICD-443.9) 11)  Coronary Artery Disease  (ICD-414.00) 12)  COPD  (ICD-496) 13)  Insomnia-sleep Disorder-unspec  (ICD-780.52) 14)  Hepatotoxicity, Drug-induced, Risk of  (ICD-V58.69) 15)  Carotid Artery Stenosis, Bilateral  (ICD-433.10) 16)  Allergic Rhinitis  (ICD-477.9) 17)  Hyperlipidemia  (ICD-272.4) 18)  Fatigue  (ICD-780.79) 19)  Depression  (ICD-311) 20)  Anxiety  (ICD-300.00) 21)  Osteoporosis  (ICD-733.00) 22)  Gerd   (ICD-530.81) 23)  Constipation  (ICD-564.00) 24)  Claudication  (ICD-443.9) 25)  Dizziness  (ICD-780.4) 26)  Chest Pain  (ICD-786.50) 27)  Hypertension  (ICD-401.9)  Medications Prior to Update: 1)  Atenolol-Chlorthalidone 50-25 Mg Tabs (Atenolol-Chlorthalidone) .Marland Kitchen.. 1po Once Daily 2)  Tramadol Hcl 50 Mg Tabs (Tramadol Hcl) .Marland Kitchen.. 1po Q 6 Hrs As Needed Pain 3)  Ecotrin Low Strength 81 Mg  Tbec (Aspirin) .Marland Kitchen.. 1po Qd 4)  Omeprazole 20 Mg  Cpdr (Omeprazole) .... 2 By Mouth Once Daily 5)  Simvastatin 40 Mg Tabs (Simvastatin) .Marland Kitchen.. 1po Once Daily 6)  Vitamin D 1000 Unit  Tabs (Cholecalciferol) .Marland Kitchen.. 1po Qd 7)  Alprazolam 0.25 Mg  Tabs (Alprazolam) .Marland Kitchen.. 1 By Mouth Two Times A Day Prn 8)  Alendronate Sodium 70 Mg  Tabs (Alendronate Sodium) .Marland Kitchen.. 1 By Mouth Q Wk 9)  Zolpidem Tartrate 10 Mg  Tabs (Zolpidem Tartrate) .Marland Kitchen.. 1 By Mouth At Bedtime As Needed 10)  Clonidine Hcl 0.2 Mg Tabs (Clonidine Hcl) .Marland Kitchen.. 1 By Mouth Two Times A Day  Current Medications (verified): 1)  Atenolol-Chlorthalidone 50-25 Mg Tabs (Atenolol-Chlorthalidone) .Marland Kitchen.. 1po Once Daily 2)  Tramadol Hcl 50 Mg Tabs (Tramadol Hcl) .Marland Kitchen.. 1po Q 6 Hrs As Needed Pain 3)  Ecotrin Low Strength 81 Mg  Tbec (Aspirin) .Marland Kitchen.. 1po Qd 4)  Omeprazole 20 Mg  Cpdr (Omeprazole) .... 2 By Mouth Once Daily 5)  Simvastatin 40 Mg  Tabs (Simvastatin) .Marland Kitchen.. 1po Once Daily 6)  Vitamin D 1000 Unit  Tabs (Cholecalciferol) .Marland Kitchen.. 1po Qd 7)  Alprazolam 0.25 Mg  Tabs (Alprazolam) .Marland Kitchen.. 1 By Mouth Two Times A Day Prn 8)  Alendronate Sodium 70 Mg  Tabs (Alendronate Sodium) .Marland Kitchen.. 1 By Mouth Q Wk 9)  Zolpidem Tartrate 10 Mg  Tabs (Zolpidem Tartrate) .Marland Kitchen.. 1 By Mouth At Bedtime As Needed 10)  Clonidine Hcl 0.2 Mg Tabs (Clonidine Hcl) .Marland Kitchen.. 1 By Mouth Two Times A Day  Allergies (verified): No Known Drug Allergies  Past History:  Past Medical History: Last updated: 11/04/2007 HYPERTENSION (ICD-401.9) GERD Osteoporosis Anxiety Depression Hyperlipidemia left knee  djd non-compliance Allergic rhinitis COPD Coronary artery disease - nonobstructive by cath 9/09 glucose intolerance Peripheral vascular disease  - left carotid 60-80% 9/09  Past Surgical History: Last updated: 11/04/2007 s/p small bowell obstruction Hysterectomy Appendectomy Oophorectomy Tonsillectomy s/p right CEA - 2/09  Family History: Last updated: 03/04/2008 stroke MI alcoholism HTN DM son with colon cancer  Social History: Last updated: 11/04/2007 Current Smoker Alcohol use-no widow 2 children 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         all otherwise negative per pt -  Physical Exam  General:  alert and underweight appearing.   Head:  normocephalic and atraumatic.   Eyes:  vision grossly intact, pupils equal, and pupils round.   Ears:  R ear normal and L ear normal.   Nose:  no external deformity and no nasal discharge.   Mouth:  no gingival abnormalities and pharynx pink and moist.   Neck:  supple and no masses.   Lungs:  normal respiratory effort, R decreased breath sounds, and L decreased breath sounds.   Heart:  normal rate and regular rhythm.   Abdomen:  soft, non-tender, and normal bowel sounds.   Msk:  no joint tenderness and no joint swelling.   Extremities:  no edema, no erythema  Neurologic:  alert & oriented X3 and cranial nerves II-XII intact.   Psych:  not anxious appearing and not  depressed appearing.     Impression & Recommendations:  Problem # 1:  PREVENTIVE HEALTH CARE (ICD-V70.0)  Overall doing well, age appropriate education and counseling updated and referral for appropriate preventive services done unless declined, immunizations up to date or declined, diet counseling done if overweight, urged to quit smoking if smokes , most recent labs reviewed and current ordered if appropriate, ecg reviewed or declined (interpretation per ECG scanned in the EMR if done); information regarding Medicare Prevention requirements given if appropriate   Orders: First annual wellness visit with prevention plan  (E4540)  Problem # 2:  HYPERTENSION (ICD-401.9)  Her updated medication list for this problem includes:    Atenolol-chlorthalidone 50-25 Mg Tabs (Atenolol-chlorthalidone) .Marland Kitchen... 1po once daily    Clonidine Hcl 0.2 Mg Tabs (Clonidine hcl) .Marland Kitchen... 1 by mouth two times a day  Orders: Prescription Created Electronically [phone]) stable overall by hx and exam, ok to continue meds/tx as is   Problem # 3:  PERIPHERAL VASCULAR DISEASE (ICD-443.9)  to check f/u carotid study, asympt, Continue all previous medications  as before this visit   Orders: Radiology Referral (Radiology)  Problem # 4:  LEG PAIN, BILATERAL (ICD-729.5) persistent discomfort, ? neuritic - for emg/ncs Orders: Misc. Referral (Misc. Ref)  Problem # 5:  THYROID NODULE, RIGHT (ICD-241.0) d/w pt - after recent d/w Dr Fredia Sorrow , it is felt risk of thyroid cancer extremely low,  will cont to follow, consider f/u thyroid u/s one yr  Complete Medication List: 1)  Atenolol-chlorthalidone 50-25 Mg Tabs (Atenolol-chlorthalidone) .Marland Kitchen.. 1po once daily 2)  Tramadol Hcl 50 Mg Tabs (Tramadol hcl) .Marland Kitchen.. 1po q 6 hrs as needed pain 3)  Ecotrin Low Strength 81 Mg Tbec (Aspirin) .Marland Kitchen.. 1po qd 4)  Omeprazole 20 Mg Cpdr (Omeprazole) .... 2 by mouth once daily 5)  Simvastatin 40 Mg Tabs (Simvastatin) .Marland Kitchen.. 1po once daily 6)   Vitamin D 1000 Unit Tabs (Cholecalciferol) .Marland Kitchen.. 1po qd 7)  Alprazolam 0.25 Mg Tabs (Alprazolam) .Marland Kitchen.. 1 by mouth two times a day prn 8)  Alendronate Sodium 70 Mg Tabs (Alendronate sodium) .Marland Kitchen.. 1 by mouth q wk 9)  Zolpidem Tartrate 10 Mg Tabs (Zolpidem tartrate) .Marland Kitchen.. 1 by mouth at bedtime as needed 10)  Clonidine Hcl 0.2 Mg Tabs (Clonidine hcl) .Marland Kitchen.. 1 by mouth two times a day  Other Orders: Pneumococcal Vaccine (16109) Admin 1st Vaccine (60454)  Patient Instructions: 1)  Continue all previous medications as before this visit  2)  You will be contacted about the referral(s) to: Carotid test, and the nerve test for the legs 3)  you had the pneumonia shot today 4)  Please schedule a follow-up appointment in 6 months.   Immunizations Administered:  Pneumonia Vaccine:    Vaccine Type: Pneumovax    Site: right deltoid    Mfr: Merck    Dose: 0.5 ml    Route: IM    Given by: Robin Ewing    Exp. Date: 03/01/2010    Lot #: [id number]    VIS given: 08/27/95 version given March 03, 2009.

## 2010-03-02 NOTE — Miscellaneous (Signed)
Summary: Orders Update  Clinical Lists Changes  Orders: Added new Test order of Renal Artery Duplex (Renal Artery Duplex) - Signed 

## 2010-03-08 NOTE — Progress Notes (Signed)
----   Converted from flag ---- ---- 03/01/2010 9:14 AM, Corwin Levins MD wrote: Zella Ball to please call pt - to inform her that I went ahead and ordered the followup u/s that is due to re-eval the right thyroid nodule to make sure no change  then convert to phone note  ---- 03/03/2009 8:57 AM, Corwin Levins MD wrote: consider repeat thyroid u/s for right nodule ------------------------------  Called pt. informed of repeat thyroid u/s for right nodule to be scheduled. Patient agreed.

## 2010-03-08 NOTE — Miscellaneous (Signed)
Summary: Orders Update   Clinical Lists Changes  Orders: Added new Referral order of Radiology Referral (Radiology) - Signed 

## 2010-03-17 ENCOUNTER — Other Ambulatory Visit: Payer: Self-pay

## 2010-03-24 ENCOUNTER — Ambulatory Visit
Admission: RE | Admit: 2010-03-24 | Discharge: 2010-03-24 | Disposition: A | Discharge status: Home / Self Care | Payer: No Typology Code available for payment source | Source: Ambulatory Visit | Attending: Internal Medicine | Admitting: Internal Medicine

## 2010-03-24 ENCOUNTER — Encounter: Payer: Self-pay | Admitting: Internal Medicine

## 2010-03-24 DIAGNOSIS — E041 Nontoxic single thyroid nodule: Secondary | ICD-10-CM

## 2010-03-29 ENCOUNTER — Encounter: Payer: Self-pay | Admitting: Internal Medicine

## 2010-04-06 NOTE — Letter (Signed)
Summary: [facility]   Imported By: Sherian Rein 03/30/2010 11:07:34  _____________________________________________________________________  External Attachment:    Type:   Image     Comment:   External Document

## 2010-04-11 NOTE — Letter (Signed)
Summary: [facility]  [facility]   Imported By: Lennie Odor 04/07/2010 11:24:53  _____________________________________________________________________  External Attachment:    Type:   Image     Comment:   External Document

## 2010-04-19 LAB — URINE MICROSCOPIC-ADD ON

## 2010-04-19 LAB — COMPREHENSIVE METABOLIC PANEL
ALT: 20 U/L (ref 0–35)
BUN: 16 mg/dL (ref 6–23)
CO2: 25 mEq/L (ref 19–32)
Calcium: 9.3 mg/dL (ref 8.4–10.5)
GFR calc non Af Amer: >60 mL/min (ref >60)
Glucose, Bld: 166 mg/dL — ABNORMAL HIGH (ref 70–99)
Sodium: 142 mEq/L (ref 135–145)

## 2010-04-19 LAB — POCT I-STAT, CHEM 8
Calcium, Ion: 0.89 mmol/L — ABNORMAL LOW (ref 1.12–1.32)
Chloride: 109 mEq/L (ref 96–112)
Creatinine, Ser: 0.8 mg/dL (ref 0.4–1.2)
Glucose, Bld: 118 mg/dL — ABNORMAL HIGH (ref 70–99)
Potassium: 3.4 mEq/L — ABNORMAL LOW (ref 3.5–5.1)

## 2010-04-19 LAB — DIFFERENTIAL
Basophils Absolute: 0 10*3/uL (ref 0.0–0.1)
Basophils Relative: 0 % (ref 0–1)
Eosinophils Relative: 0 % (ref 0–5)
Lymphocytes Relative: 3 % — ABNORMAL LOW (ref 12–46)
Monocytes Absolute: 0.6 10*3/uL (ref 0.1–1.0)
Neutro Abs: 12.3 10*3/uL — ABNORMAL HIGH (ref 1.7–7.7)

## 2010-04-19 LAB — CBC
HCT: 46.5 % — ABNORMAL HIGH (ref 36.0–46.0)
Hemoglobin: 16 g/dL — ABNORMAL HIGH (ref 12.0–15.0)
MCHC: 34.4 g/dL (ref 30.0–36.0)
MCV: 90.3 fL (ref 78.0–100.0)
RBC: 5.15 MIL/uL — ABNORMAL HIGH (ref 3.87–5.11)

## 2010-04-19 LAB — POCT CARDIAC MARKERS: CKMB, poc: <1 ng/mL — ABNORMAL LOW (ref 1.0–8.0)

## 2010-04-19 LAB — URINALYSIS, ROUTINE W REFLEX MICROSCOPIC
Ketones, ur: >80 mg/dL — AB
Leukocytes, UA: NEGATIVE
Nitrite: NEGATIVE
Protein, ur: 30 mg/dL — AB
Urobilinogen, UA: 0.2 mg/dL (ref 0.0–1.0)

## 2010-04-19 LAB — LIPASE, BLOOD: Lipase: 15 U/L (ref 11–59)

## 2010-04-28 ENCOUNTER — Ambulatory Visit: Payer: No Typology Code available for payment source | Admitting: Internal Medicine

## 2010-05-11 ENCOUNTER — Encounter: Payer: Self-pay | Admitting: Internal Medicine

## 2010-05-12 ENCOUNTER — Encounter: Payer: Self-pay | Admitting: Internal Medicine

## 2010-05-12 ENCOUNTER — Ambulatory Visit (INDEPENDENT_AMBULATORY_CARE_PROVIDER_SITE_OTHER): Payer: Medicare Other | Admitting: Internal Medicine

## 2010-05-12 ENCOUNTER — Other Ambulatory Visit (INDEPENDENT_AMBULATORY_CARE_PROVIDER_SITE_OTHER): Payer: No Typology Code available for payment source

## 2010-05-12 DIAGNOSIS — I10 Essential (primary) hypertension: Secondary | ICD-10-CM

## 2010-05-12 DIAGNOSIS — R5383 Other fatigue: Secondary | ICD-10-CM

## 2010-05-12 DIAGNOSIS — E785 Hyperlipidemia, unspecified: Secondary | ICD-10-CM

## 2010-05-12 DIAGNOSIS — R5381 Other malaise: Secondary | ICD-10-CM

## 2010-05-12 DIAGNOSIS — M79609 Pain in unspecified limb: Secondary | ICD-10-CM

## 2010-05-12 DIAGNOSIS — M79605 Pain in left leg: Secondary | ICD-10-CM

## 2010-05-12 DIAGNOSIS — I2510 Atherosclerotic heart disease of native coronary artery without angina pectoris: Secondary | ICD-10-CM

## 2010-05-12 LAB — BASIC METABOLIC PANEL
BUN: 22 mg/dL (ref 6–23)
CO2: 30 mEq/L (ref 19–32)
Calcium: 9.5 mg/dL (ref 8.4–10.5)
Chloride: 105 mEq/L (ref 96–112)
Creatinine, Ser: 0.9 mg/dL (ref 0.4–1.2)
Glucose, Bld: 128 mg/dL — ABNORMAL HIGH (ref 70–99)

## 2010-05-12 LAB — LIPID PANEL
Cholesterol: 200 mg/dL (ref 0–200)
HDL: 124.8 mg/dL (ref >39.00)
LDL Cholesterol: 64 mg/dL (ref 0–99)
Triglycerides: 54 mg/dL (ref 0.0–149.0)

## 2010-05-12 LAB — CBC WITH DIFFERENTIAL/PLATELET
Basophils Relative: 0.5 % (ref 0.0–3.0)
Eosinophils Absolute: 0.1 10*3/uL (ref 0.0–0.7)
Hemoglobin: 14.1 g/dL (ref 12.0–15.0)
Lymphocytes Relative: 18.9 % (ref 12.0–46.0)
MCHC: 34.4 g/dL (ref 30.0–36.0)
Monocytes Relative: 7.6 % (ref 3.0–12.0)
Neutrophils Relative %: 71.5 % (ref 43.0–77.0)
RBC: 4.4 Mil/uL (ref 3.87–5.11)
WBC: 7.1 10*3/uL (ref 4.5–10.5)

## 2010-05-12 LAB — HEPATIC FUNCTION PANEL
ALT: 14 U/L (ref 0–35)
Albumin: 4 g/dL (ref 3.5–5.2)
Bilirubin, Direct: 0.1 mg/dL (ref 0.0–0.3)
Total Protein: 7.2 g/dL (ref 6.0–8.3)

## 2010-05-12 MED ORDER — CITALOPRAM HYDROBROMIDE 10 MG PO TABS: 10.0000 mg | ORAL_TABLET | Freq: Every day | ORAL | Status: DC

## 2010-05-12 MED ORDER — TRAMADOL HCL 50 MG PO TABS: 50.0000 mg | ORAL_TABLET | Freq: Four times a day (QID) | ORAL | Status: DC | PRN

## 2010-05-12 MED ORDER — ALENDRONATE SODIUM 70 MG PO TABS: 70.0000 mg | ORAL_TABLET | ORAL | Status: DC

## 2010-05-12 NOTE — Patient Instructions (Signed)
Continue all other medications as before Please go to LAB in the Basement for the blood and/or urine tests to be done today Please call the number on the Blue Card (the [employer] System) for results of testing in 2-3 days You will be contacted regarding the referral for: Leg circulation test (dopplers)

## 2010-05-12 NOTE — Assessment & Plan Note (Signed)
Etiology unclear, Exam otherwise benign, to check labs as documented, follow with expectant management  Lab Results  Component Value Date   WBC 13.4* 03/26/2009   HGB 16.7* 03/26/2009   HCT 49.0* 03/26/2009   PLT 174 03/26/2009   CHOL 214* 02/03/2009   TRIG 120.0 02/03/2009   HDL 118.60 02/03/2009   LDLDIRECT 77.3 02/03/2009   ALT 20 03/26/2009   AST 31 03/26/2009   NA 138 03/26/2009   K 3.4* 03/26/2009   CL 109 03/26/2009   CREATININE 0.8 03/26/2009   BUN 11 03/26/2009   CO2 25 03/26/2009   TSH 0.42 02/03/2009   HGBA1C 5.4 02/03/2009

## 2010-05-12 NOTE — Assessment & Plan Note (Addendum)
stable overall by hx and exam, most recent lab reviewed with pt, and pt to continue medical treatment as before;  Pt has not taken meds today yet, state o/w good compliance  BP Readings from Last 3 Encounters:  12/05/09 130/62  03/03/09 112/52  02/01/09 198/90

## 2010-05-12 NOTE — Assessment & Plan Note (Signed)
stable overall by hx and exam, most recent lab reviewed with pt, and pt to continue medical treatment as before  Lab Results  Component Value Date   LDLCALC 78 12/05/2007

## 2010-05-12 NOTE — Assessment & Plan Note (Addendum)
?   Claudication - for LE dopplers arterial, o/w exam benign for acute,  to f/u any worsening symptoms or concerns, Continue all other medications as before, pt urged to quit smoking

## 2010-05-12 NOTE — Assessment & Plan Note (Signed)
stable overall by hx and exam, most recent lab reviewed with pt, and pt to continue medical treatment as before, to check ecg today

## 2010-05-14 ENCOUNTER — Encounter: Payer: Self-pay | Admitting: Internal Medicine

## 2010-05-14 NOTE — Progress Notes (Signed)
Subjective:    Patient ID: [name], female    DOB: 12-23-1940, 70 y.o.   MRN: [id number]  HPI  Here to f/u - overall doing ok, but has been having recurrent bilat LE pain, mild to mod, worse usually to ambulation, without recurring LBP, bowel or bladder change, fever, wt loss,  worsening LE pain/numbness/weakness, gait change or falls.   Pt denies chest pain, increased sob or doe, wheezing, orthopnea, PND, increased LE swelling, palpitations, dizziness or syncope.  Pt denies new neurological symptoms such as new headache, or facial or extremity weakness or numbness   Pt denies polydipsia, polyuria   Pt states overall good compliance with meds, but states did not take BP meds this am. Is trying to follow lower cholesterol, diabetic diet, wt overall stable but little exercise however.   Overall good compliance with treatment, and good medicine tolerability.   Pt denies fever, wt loss, night sweats, loss of appetite, or other constitutional symptoms  Denies worsening depressive symptoms, suicidal ideation, or panic, though has ongoing anxiety, not increased recently.  Still smoking and just not interested in trying to quit.   Does have sense of ongoing fatigue, but denies signficant hypersomnolence.  Past Medical History  Diagnosis Date  . THYROID NODULE, RIGHT 02/01/2009  . GLUCOSE INTOLERANCE 11/04/2007  . HYPERLIPIDEMIA 02/12/2007  . HYPOKALEMIA 11/04/2007  . ANXIETY 02/12/2007  . DEPRESSION 02/12/2007  . HYPERTENSION 11/07/2006  . CORONARY ARTERY DISEASE 11/04/2007  . CAROTID ARTERY STENOSIS, BILATERAL 02/24/2007  . Unspecified Peripheral Vascular Disease 02/12/2007  . ASTHMATIC BRONCHITIS, ACUTE 04/22/2008  . ALLERGIC RHINITIS 02/12/2007  . COPD 11/04/2007  . GERD 02/12/2007  . CONSTIPATION 02/12/2007  . DEGENERATIVE JOINT DISEASE, FINGERS 03/04/2008  . LOW BACK PAIN, CHRONIC 02/01/2009  . BACK PAIN, RIGHT 12/05/2009  . LEG PAIN, BILATERAL 02/01/2009  . OSTEOPOROSIS 02/12/2007  . DIZZINESS 02/12/2007  .  INSOMNIA-SLEEP DISORDER-UNSPEC 08/04/2007  . FATIGUE 02/12/2007  . CHEST PAIN 02/12/2007  . Coronary artery disease 9/09    nonobstructive by cath  . Peripheral vascular disease 9/09    left carotid 60-80%   . Impaired glucose tolerance 05/11/2010  . Leg pain, bilateral 05/12/2010   Past Surgical History  Procedure Date  . S/p bowel obstruction   . Abdominal hysterectomy   . Appendectomy   . Oophorectomy   . Tonsillectomy   . S/p right cea 2/09  . S/p right thyroid nodule biopsy March 2011    negative    reports that she has been smoking.  She does not have any smokeless tobacco history on file. She reports that she does not drink alcohol or use illicit drugs. family history includes Alcohol abuse in her other; Cancer in her son; Diabetes in her other; Heart attack in her other; Hypertension in her other; and Stroke in her other. No Known Allergies Current Outpatient Prescriptions on File Prior to Visit  Medication Sig Dispense Refill  . aspirin 81 MG EC tablet Take 81 mg by mouth daily.        Marland Kitchen atenolol-chlorthalidone (TENORETIC) 50-25 MG per tablet Take 1 tablet by mouth daily.        . cholecalciferol (VITAMIN D) 1000 UNITS tablet Take 1,000 Units by mouth daily.        . cloNIDine (CATAPRES) 0.2 MG tablet Take 0.2 mg by mouth 2 (two) times daily.        . simvastatin (ZOCOR) 40 MG tablet Take 40 mg by mouth daily.  Review of Systems Review of Systems  Constitutional: Negative for diaphoresis and unexpected weight change.  HENT: Negative for drooling and tinnitus.   Eyes: Negative for photophobia and visual disturbance.  Respiratory: Negative for choking and stridor.   Gastrointestinal: Negative for vomiting and blood in stool.  Genitourinary: Negative for hematuria and decreased urine volume.  Musculoskeletal: Negative for gait problem.  Skin: Negative for color change and wound.  Neurological: Negative for tremors and numbness.  Psychiatric/Behavioral: Negative for  decreased concentration. The patient is not hyperactive.       Objective:   Physical Exam There were no vitals taken for this visit. Physical Exam  VS noted Constitutional: Pt appears well-developed and well-nourished.  HENT: Head: Normocephalic.  Right Ear: External ear normal.  Left Ear: External ear normal.  Eyes: Conjunctivae and EOM are normal. Pupils are equal, round, and reactive to light.  Neck: Normal range of motion. Neck supple.  Cardiovascular: Normal rate and regular rhythm.   Pulmonary/Chest: Effort normal and breath sounds decresed bilat, no wheezing or rales  Abd:  Soft, NT, non-distended, + BS Neurological: Pt is alert. No cranial nerve deficit.  Skin: Skin is warm. No erythema. trace to 1+ bilat dorsalis pedis pulse bialt Psychiatric: Pt behavior is normal. Thought content normal. 1+ nervous        Assessment & Plan:

## 2010-06-13 NOTE — Cardiovascular Report (Signed)
[name], [name] NO.:  [id number]   MEDICAL RECORD NO.:  [id number]          PATIENT TYPE:  INP   LOCATION:  3736                         FACILITY:  [facility]   PHYSICIAN:  Verne Carrow, MDDATE OF BIRTH:  11-29-1940   DATE OF PROCEDURE:  09/26/2007  DATE OF DISCHARGE:                            CARDIAC CATHETERIZATION   PROCEDURE PERFORMED:  1. Left heart catheterization.  2. Selective coronary angiography.  3. Left ventricular angiogram.   OPERATOR:  Verne Carrow, MD   INDICATIONS:  Shortness of breath and left arm pain in a patient with  known peripheral vascular disease, hypertension, and hyperlipidemia.   DETAILS OF PROCEDURE:  The patient was brought to the Heart  Catheterization Laboratory after signing informed consent.  Her right  groin was prepped and draped in a sterile fashion.  A 5-French sheath  was inserted into the right femoral artery.  Standard Judkins 5-French  diagnostic catheters were used to inject the left system and the right  coronary artery.  A 5-French pigtail catheter was used to cross the  aortic valve into the left ventricle.  A left ventricular angiogram was  performed.  A pull back was performed across the aortic valve. The  patient tolerated the procedure well and was taken to the recovery area  in stable condition.   FINDINGS OF THE PROCEDURE:  1. Left main coronary artery bifurcated into the circumflex and the      LAD.  There was no disease noted in the left main system.  2. The left anterior descending coronary artery had a long tubular 40%      stenosis in the proximal portion.  The mid LAD had a 30% stenosis.      There was a large diagonal vessel noted, which had no disease.      This artery was severely tortuous.  3. The circumflex had a 20% proximal stenosis and a 30% mid stenosis.      There was a large obtuse marginal that had plaque disease only.      The circumflex system was severely  tortuous.  4. The right coronary artery had a proximal 40% stenosis and a mid 30%      stenosis.  This vessel was also very tortuous.  5. The left ventricle had no wall motion abnormalities and normal      systolic function.  Ejection fraction was estimated at 65-70%.  6. Hemodynamic data: left ventricular pressure 180/6, end-diastolic      pressure 18, and aortic pressure 189/78.   IMPRESSION:  1. Nonobstructive coronary artery disease noted in severely tortuous      arteries.  2. Normal left ventricular function.   RECOMMENDATIONS:  I recommend medical management for this patient's  nonobstructive coronary artery disease.      Verne Carrow, MD  Electronically Signed     CM/MEDQ  D:  09/26/2007  T:  09/27/2007  Job:  [id number]   cc:   Bevelyn Buckles. Bensimhon, MD

## 2010-06-13 NOTE — Discharge Summary (Signed)
[name], [name] NO.:  [id number]   MEDICAL RECORD NO.:  [id number]          PATIENT TYPE:  INP   LOCATION:  3736                         FACILITY:  [facility]   PHYSICIAN:  Barbette Hair. Artist Pais, DO      DATE OF BIRTH:  03/19/1940   DATE OF ADMISSION:  09/26/2007  DATE OF DISCHARGE:  09/27/2007                               DISCHARGE SUMMARY   DISCHARGE DIAGNOSES:  1. Left shoulder pain question anginal equivalent.  2. Status post cardiac catheterization - nonobstructive coronary      artery disease, normal left ventricular function.  3. Hypertension.  4. Hyperlipidemia.  5. History of peripheral vascular disease.  6. History of carotid disease.  7. Tobacco abuse.  8. Hypokalemia.  9. Hyperglycemia.  10.Osteoporosis.  11.Gastroesophageal reflux disease.   DISCHARGE MEDICATIONS:  1. Catapres 0.2 mg p.o. b.i.d.  2. Hydrochlorothiazide 25 mg one-half tab daily.  3. Ecotrin 81 mg once daily.  4. Omeprazole 20 mg 2 capsules daily.  5. Pravastatin 40 mg once daily.  6. Vitamin D 1000 units daily.  7. Fosamax 20 mg once daily.  8. Citalopram 10 mg once daily.  9. Metoprolol/Toprol-XL 25 mg once daily.  10.Alprazolam 0.25 mg twice daily as needed.   FOLLOWUP INSTRUCTIONS:  She will follow up with Dr. Oliver Barre within 1  week.  She is to call his office for an appointment.  The patient is  advised to obtain BMET on September 30, 2007, regarding hypokalemia.   HOSPITAL COURSE:  The patient is a 70 year old African American female  with known peripheral vascular disease status post CEA, who presented  with left arm pain associated with shortness of breath and nausea.  There was concern her symptoms were anginal equivalent.  The patient was  admitted for further evaluation.  She was seen by Cardiology who  recommended cardiac cath.  Cardiac catheterization showed nonobstructive  coronary artery disease, and the patient noted to have 40%-50% stenosis  with LAD and 20%  of circumflex, and 40% in the right coronary artery.   The patient's left shoulder symptoms improved.  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   The patient's blood pressure was slightly elevated on admission.  Metoprolol was added.  The patient's blood pressure was 109 systolic on  day of discharge.  Due to hypokalemia, her HCTZ was decreased to 12.5  mg.  She was given 40 mEq x2 before discharge.  She was advised to  follow up with Dr. Jonny Ruiz for followup BMET on September 30, 2007.   The patient has ongoing tobacco abuse.  We counseled the patient on the  importance of smoking cessation.  The patient can discuss use of Chantix  with her PCP.   The patient has family history of type 2 diabetes.  She had mild  hyperglycemia on admission.  I suggested followup A1c testing as an  outpatient.   CONDITION ON DISCHARGE:  The patient's left shoulder pain had resolved.  She was felt medically stable for discharge.      Barbette Hair. Artist Pais, DO  Electronically Signed  RDY/MEDQ  D:  09/27/2007  T:  09/28/2007  Job:  [id number]   cc:   Corwin Levins, MD

## 2010-06-13 NOTE — Assessment & Plan Note (Signed)
OFFICE VISIT   [name], [name]  DOB:  August 18, 1940                                       04/04/2007  ZOXWR#:[id number]   The patient presents today for followup of her right carotid  endarterectomy and Dacron patch angioplasty for severe asymptomatic  stenosis on 03/04/2007.  She did well in the hospital and discharged on  postoperative day 1.  She has had no wound-healing difficulty and has  had no neurologic deficits.   PHYSICAL EXAM:  Today, her blood pressure is 201/101.  Her pulse is 84,  respirations 18.  Her neck incision is well-healed.  She does not have  any right bruit and does have a soft left carotid bruit.  I discussed  this with the patient.  We will see her again in 6 months for repeat  carotid duplex for evaluation of her left moderate stenosis and her  right endarterectomy.  She will continue her followup with Dr. Jonny Ruiz  regarding her hypertension control.  She will notify us should she have  any neurologic deficits.   Larina Earthly, M.D.  Electronically Signed   TFE/MEDQ  D:  04/04/2007  T:  04/07/2007  Job:  [id number]   cc:   Corwin Levins, MD

## 2010-06-13 NOTE — Procedures (Signed)
CAROTID DUPLEX EXAM   INDICATION:  Followup carotid artery disease.   HISTORY:  Diabetes:  No.  Cardiac:  No.  Hypertension:  Yes.  Smoking:  Yes.  Previous Surgery:  Right CEA with DPA 03/04/2007 by Dr. Arbie Cookey.  CV History:  Asymptomatic.  Amaurosis Fugax No, Paresthesias No, Hemiparesis No                                       RIGHT             LEFT  Brachial systolic pressure:         200               202  Brachial Doppler waveforms:         WNL               WNL  Vertebral direction of flow:        Antegrade         Antegrade  DUPLEX VELOCITIES (cm/sec)  CCA peak systolic                   107               M=115, D=202  ECA peak systolic                   97                181  ICA peak systolic                   128               215  ICA end diastolic                   36                62  PLAQUE MORPHOLOGY:                                    Calcific  PLAQUE AMOUNT:                      None              Moderate  PLAQUE LOCATION:                                      Bifurcation / ICA  / ECA   IMPRESSION:  1. Right internal carotid artery velocities are suggestive of 40%-59%      stenosis (low end of range) at distal end of patch, however, no      plaque visualized.  2. Left internal carotid artery shows evidence of 60%-79% stenosis      (low end of range).  3. Left distal common carotid artery stenosis.  4. Left external carotid artery stenosis.  5. Left stenosis appears to be fairly focalized at bifurcation /      distal common carotid artery extending slightly into the internal      carotid artery and external carotid artery.  6. No significant changes from previous study.   ___________________________________________  Larina Earthly, M.D.   AS/MEDQ  D:  12/10/2008  T:  12/11/2008  Job:  [id number]

## 2010-06-13 NOTE — Consult Note (Signed)
NAMEAIVA, MISKELL NO.:  [id number]   MEDICAL RECORD NO.:  [id number]          PATIENT TYPE:  INP   LOCATION:  3736                         FACILITY:  [facility]   PHYSICIAN:  Bevelyn Buckles. Bensimhon, MDDATE OF BIRTH:  11-06-1940   DATE OF CONSULTATION:  DATE OF DISCHARGE:                                 CONSULTATION   SUMMERY OF HISTORY:  Ms. Helming is a 70 year old African American female  who was transferred to [facility] via EMS and admitted by primary care  physician secondary to shortness of breath and left arm discomfort.  We  were asked to consult.  Apparently, Ms. Tesfaye last night around 9 p.m.  at rest had developed some shortness of breath.  She attributed this to  a panic attack.  Her son then called EMS around 10 p.m.; however, the  patient refused transport.  It is unclear duration of her symptoms and  the patient cannot describe any associated symptoms alleviating or  aggravating factors.  She stated she went to bed and slept well;  however, this morning, she noticed more shortness of breath and  developed left arm discomfort, again the patient is very nondescript.  In her description, she describes some nausea, but denied vomiting and  feeling like a hot flash.  She denies prior occurrences of these  symptoms; however, she frequently gets panic attacks, but cannot really  describable.   ALLERGIES:  No known drug allergies.   MEDICATIONS:  Prior to admission include,  1. Catapres 0.2 b.i.d.  2. Hydrochlorothiazide 25 daily.  3. Darvocet-N 100 q.i.d. p.r.n.  4. Ecotrin 81 daily.  5. Omeprazole 20 mg 2 tablets daily.  6. Vitamin D.  7. Alprazolam 0.25 mg b.i.d. and p.r.n.  8. Fluconazole 150 mg 3 times a day.  9. Ambien 10 mg at bedtime.  10.Citalopram 10 mg daily.  11.Alendronate 70 mg weekly.   Her past medical history is notable for hyperlipidemia.  Last lipid  check in September 2007, showed a total cholesterol of 223,  triglycerides 93, HDL  80, and LDL 118.  She has a history of  cerebrovascular disease and underwent right carotid endarterectomy in  March 04, 2007 by Dr. Arbie Cookey (asymptomatic at that time).  A  preoperative echo on February 20, 2007 showed a EF of 70%, LVOT  obstruction, dynamic with Valsalva, peak velocity was 4.9 mm/sec, mild  RVH, and mild-to-moderate TR.  She had an adenosine Myoview on February 20, 2007 showed a dynamic LV and was felt to be a overall low risk for  ischemia.  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   Surgical history is notable for appendectomy, hysterectomy, T and A, and  bilateral oophorectomy.   SOCIAL HISTORY:  She resides in [place] with her son in their house.  She has two children, two grandchildren, no great grandchildren.  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  She continues to smoke a half a pack  per day at least for 30 years.  She  admits to gym 2-3 times a month,  denies drugs, specific exercise, diet.   FAMILY HISTORY:  Mother died in her 72s with a myocardial infarction and  history of hypertension, her father in his 88s with a CVA.  She has one  sister living with a history of a pacemaker, hypertension, and diabetes.  She has four sisters deceased, four brothers deceased.  All were older  than she was, specifics unknown.   Review of systems is notable for glasses, chronic shortness of breath,  occasional dizziness, postmenopausal depression, anxiety, insomnia,  arthralgias particularly in her legs with constipation.  Other systems  are unremarkable unless noted above.   PHYSICAL EXAMINATION:  GENERAL:  Well-nourished, well-developed,  pleasant, African American female in no apparent distress.  VITAL SIGNS:  Blood pressure 166/84, pulse 76, respirations 16,  temperature 98.6, 100% sat on 2 liters.  HEENT:  Grossly unremarkable.  NECK:  Supple without thyromegaly,  adenopathy, or JVD.  She does have  bilateral carotid bruits.  CHEST:  Symmetrical excursion.  Clear to auscultation.  Decreased breath  sounds.  HEART:  Regular rate and rhythm.  Normal S1 and S2, did not appreciate  any murmurs, rubs, clicks, or gallops.  All pulses were symmetrical and  intact.  SKIN:  Integument is intact.  ABDOMEN:  Soft.  Bowel sounds present without organomegaly, masses, or  tenderness.  EXTREMITIES:  No cyanosis, clubbing, or edema.  MUSCULOSKELETAL:  Grossly unremarkable.  NEUROLOGIC:  Grossly unremarkable.   Chest x-ray shows COPD and emphysema.  No acute findings.   EKG shows normal sinus rhythm, LVH, bilateral atrial enlargement, early  R and repolarization changes reflected in V2 and probably secondary to  lead placement as compared to EKGs in January 2009.  An i-STAT shows a H  and H of 14.3 and 42.0, sodium 140, potassium 3.4, BUN 16, creatinine  1.0, glucose 116, point of care marker negative x1.   IMPRESSION:  1. Left arm discomfort associated with new shortness of breath,      possible angina equivalent.  2. Hypertension.  3. Tobacco use.  4. Cerebrovascular disease with recent carotid endarterectomy.  5. 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  6. History as listed per past medical history.   DISPOSITION:  Dr. Gala Romney reviewed the patient's history, spoke with  and examined the patient and agrees with the above.  He felt her  symptoms were concerning for unstable angina and agrees with pursuing a  cardiac  catheterization today particularly since she has recently had a stress  test that was unremarkable and her risk factors.  The procedure, risks,  indications, and benefits were explained to the patient and the  granddaughter, she agrees to proceed as planned.      Joellyn Rued, PA-C      Bevelyn Buckles. Bensimhon, MD  Electronically Signed    EW/MEDQ  D:  09/26/2007  T:  09/27/2007  Job:   [id number]   cc:   Arleta Creek, M.D.

## 2010-06-13 NOTE — Op Note (Signed)
NAMESHAKENNA, [name] NO.:  [id number]   MEDICAL RECORD NO.:  [id number]          PATIENT TYPE:  INP   LOCATION:  3308                         FACILITY:  [facility]   PHYSICIAN:  Larina Earthly, M.D.    DATE OF BIRTH:  1940-09-30   DATE OF PROCEDURE:  03/04/2007  DATE OF DISCHARGE:                               OPERATIVE REPORT   PREOPERATIVE DIAGNOSIS:  Severe asymptomatic right internal carotid  artery stenosis.   POSTOPERATIVE DIAGNOSIS:  Severe asymptomatic right internal carotid  artery stenosis.   PROCEDURE:  Right carotid endarterectomy and Dacron patch angioplasty.   SURGEON:  Larina Earthly, M.D.   ASSISTANT:  Gae Bon, PA-C.   ANESTHESIA:  General endotracheal.   COMPLICATIONS:  None.   DISPOSITION:  To recovery room neurologically intact.   DESCRIPTION OF PROCEDURE:  The patient was taken to the operating room  and place in the supine position where the area of the right neck was  prepped and draped in usual sterile fashion. Incision was made at  anterior sternocleidomastoid and carried down through the platysma with  cautery.  Sternocleidomastoid reflected posteriorly and the carotid  sheath was opened.  Facial vein was ligated 2-0 silk ties and divided.  The common carotid artery was encircled with an umbilical tape and Rumel  tourniquet.  Dissection extended on to the bifurcation.  The superior  thyroid artery was circled with a 2-0 silk Potts tie.  The external  carotid circled with a blue Vesseloop.  The internal carotid encircled  with an umbilical tape an Rumel tourniquet.  The vagus and hypoglossal  nerves were identified and preserved.  The patient was given 6000  intravenous heparin.  After adequate circulation time, the internal,  external and common carotid arteries were occluded.  Common carotid  artery was encircled with an umbilical tape and Rumel tourniquet.  Superior thyroid artery was encircled 2-0 silk Potts tie.  The  external  carotid was encircled with a blue Vesseloop.  The internal carotid  circled with an umbilical tape and Rumel tourniquet.  The patient was  given 6000 units of intravenous heparin.  After adequate circulation  time, the internal, external and common carotid arteries were occluded.  The common carotid artery was opened with an 11 blade, extended  longitudinally with Potts scissors.  This was continued on to the  internal carotid artery.  A 10 shunt was passed up the internal carotid  artery, out the backbleed and then down the common carotid where it was  secured with Mayo tourniquets.  Endarterectomy was begun on the common  carotid artery and the plaque was divided proximally with Potts  scissors.  The endarterectomy was continued on to the bifurcation.  The  external carotid was endarterectomized with eversion technique and the  internal carotid was endarterectomized in open fashion.  Remaining  atheromatous debris was removed from endarterectomy plane.  A Finesse  Hemashield Dacron patch was brought onto the field and was sewn as a  patch angioplasty with a running 6-0 Prolene suture.  Prior to  completion of the anastomosis, shunt  was removed and the usual flushing  maneuvers were undertaken.  The anastomosis was completed and flow was  restored first to the external, then the internal carotid artery.  The  patient was given 50 mg of protamine to reverse the heparin.  The wounds  were irrigated with saline.  Hemostasis with cautery.  Wounds were  closed with several 3-0  Vicryl sutures to reapproximate sternocleidomastoid over the carotid  sheath.  Next, the platysma was closed a running 3-0 Vicryl sutures and  finally the skin was closed with 4-0 subcuticular Vicryl stitch.  Sterile dressing was applied.  The patient was taken to the recovery  room stable condition.      Larina Earthly, M.D.  Electronically Signed     TFE/MEDQ  D:  03/04/2007  T:  03/04/2007  Job:   [id number]

## 2010-06-13 NOTE — Discharge Summary (Signed)
[name], [name] NO.:  [id number]   MEDICAL RECORD NO.:  [id number]          PATIENT TYPE:  INP   LOCATION:  3308                         FACILITY:  [facility]   PHYSICIAN:  Larina Earthly, M.D.    DATE OF BIRTH:  04-Jun-1940   DATE OF ADMISSION:  03/04/2007  DATE OF DISCHARGE:  03/05/2007                               DISCHARGE SUMMARY   ADMISSION DIAGNOSIS:  Severe symptomatic right internal carotid artery  stenosis.   DISCHARGE DIAGNOSIS:  Severe asymptomatic right internal carotid artery  stenosis.   SECONDARY DIAGNOSES:  1. Hypertension.  2. Elevated cholesterol.   PROCEDURE:  Right carotid endarterectomy with Dacron patch angioplasty  done by Dr. Gretta Began on March 04, 2007.   CONSULTANTS:  None.   BRIEF HISTORY AND PHYSICAL:  This patient is a 70 year old black female  who was found to have carotid bruit on physical exam.  She denies any  prior amaurosis fugax, transient ischemia, or stroke.  She underwent  carotid Duplex for further evaluation and this revealed critical  stenosis on the right and moderate to severe stenosis on the left  carotid system.  Options were discussed with the patient.  It was  recommended that she undergo right carotid endarterectomy and she is now  admitted for surgery.  The patient agreed to this plan and will be  admitted to [facility] for a right carotid  endarterectomy and Dacron patch angioplasty.  The procedure was  discussed at length with the patient and her family present.  Complications of potential for stroke and cranial nerve injury,  bleeding, infection were discussed with the patient.  She understands  and wishes to proceed with surgery with the right carotid endarterectomy  on March 04, 2007.  She understands that within all likelihood, this  will be an overnight admission with discharge to home on March 05, 2007, as long as she has no complications.   HOSPITAL COURSE:  The  patient was admitted to [facility] on  March 04, 2007, to undergo any right carotid endarterectomy with  Dacron patch angioplasty by Dr. Gretta Began.  The patient had signed  consent and agreed to this procedure.  The patient was taken to the OR  and had no complications throughout the procedure and remained stable  and was transferred to PACU stable.  The patient stayed in the PACU for  several hours while she continued to remain stable and then was  transferred floor 3300.  The patient remained stable overnight on 3300.  On postop day #1, the patient continued to remain stable on March 05, 2007.  The patient's vitals shows blood pressure was 129/53, heart rate  67, respiration 24, temperature 98.6, oxygen 108 on room air.   LABORATORY DATA:  White blood count 7.1, hemoglobin 12.1, hematocrit  34.9, platelets of 146.  Sodium 140, potassium 3.1, chloride 104, bicarb  32, BUN 6, creatinine 0.76, glucose 96 and calcium 8.5.   The patient continues to have no complaints and has tolerated the  procedure well.  The patient was  in bed upon entering room, resting and  had already ambulated and tolerated this morning.  The patient had  already eaten with no difficulty swallowing.  The patient did say she  had a little bit of mild soreness on her right neck.  The patient had  voided post discontinuing the Foley.   PHYSICAL EXAMINATION:  GENERAL APPEARANCE:  The patient is alert and  oriented x3, laying in bed and had just finished eating and tolerating  breakfast.  HEART:  Normal rate, regular rhythm.  LUNGS: Clear bilaterally.  ABDOMEN:  Soft, nontender, nondistended with active bowel sounds.  SKIN:  The right neck incision was clean and dry, we removed the  dressings.  There is no hematoma, no signs of infection.  Wound is  healing and looks good.  NEUROLOGIC:  The patient was intact.  No deviation of tongue.  No  decrease in sensation or strength no visual or hearing  changes.   The patient states that she feels up to going home and we agree with  this.  The patient will discharge to home as long as she continues to  remain stable and afebrile on March 05, 2007.   DISCHARGE MEDICATIONS:  1. Hydrochlorothiazide 25 mg daily.  2. Tramadol 50 mg p.r.n.  3. Pravastatin 40 mg daily.  4. Tylox 1-2 tablets every 4-6 hours as needed for pain.   DISCHARGE INSTRUCTIONS:  Discussed with the patient discharge  instructions and she states she understood and agreed on March 05, 2007, indicated for discharge on March 05, 2007.  The patient is  instructed to clean with soap and water.  Increase activity and walk  with assistance.  The patient may shower or bathe on March 06, 2007.  The patient is to do no lifting or driving for 2 weeks.  The patient is  also instructed to contact the office if any redness, drainage, fever  greater than 101, any neurological changes like onset of severe headache  or visual changes.  Patient instructed to follow up with Dr. Arbie Cookey in 2  weeks.  Office will contact to schedule.  The patient is continue home  meds per reconciliation sheet.  In addition, the patient was given a  prescription for Tylox one to two tablets every 4-6 hours to take as  needed for pain.  Again, patient agreed with these instructions and  stated that she felt comfortable discharging to home on March 05, 2007.      Cyndy Freeze, PA      Larina Earthly, M.D.  Electronically Signed    ALW/MEDQ  D:  03/05/2007  T:  03/05/2007  Job:  [id number]

## 2010-06-13 NOTE — Procedures (Signed)
CAROTID DUPLEX EXAM   INDICATION:  Follow-up evaluation of known carotid artery disease.   HISTORY:  Diabetes:  No.  Cardiac:  No.  Hypertension:  Yes.  Smoking:  Yes.  Previous Surgery:  Right carotid endarterectomy with Dacron patch  angioplasty on 03/04/07 by Dr. Arbie Cookey.  CV History:  Patient complains of periodic episodes of left arm numbness  which lasted about 2-3 minutes twice a month.  Amaurosis Fugax No, Paresthesias Yes, Hemiparesis No.                                       RIGHT             LEFT  Brachial systolic pressure:         186               190  Brachial Doppler waveforms:         Triphasic         Triphasic  Vertebral direction of flow:        Antegrade         Antegrade  DUPLEX VELOCITIES (cm/sec)  CCA peak systolic                   138               113  ECA peak systolic                   115               138  ICA peak systolic                   133               225  ICA end diastolic                   33                61  PLAQUE MORPHOLOGY:                  None              Calcified  PLAQUE AMOUNT:                      None              Moderate-to-severe  PLAQUE LOCATION:                    None              Proximal ICA   IMPRESSION:  1. No right internal carotid artery stenosis, status post      endarterectomy.  2. 60-79% left internal carotid artery stenosis.   ___________________________________________  Larina Earthly, M.D.   MC/MEDQ  D:  10/17/2007  T:  10/18/2007  Job:  [id number]

## 2010-06-13 NOTE — Consult Note (Signed)
NEW PATIENT CONSULTATION   Conley, Leah A  DOB:  [birth year]                                       02/26/2007  NFAOZ#:[id number]   CHIEF COMPLAINT:  Severe asymptomatic right internal carotid artery  stenosis.   HISTORY OF PRESENT ILLNESS:  Patient is a 70 year old black female who  was found to have carotid bruit on physical exam.  She denies any prior  amaurosis fugax, transient ischemic attack, or stroke.  She underwent  carotid duplex for further evaluation, and this revealed critical  stenosis on the right and moderate-to-severe stenosis on the left  carotid system.  Options were discussed with patient.  It was  recommended that she undergo right carotid endarterectomy, and she is  now admitted for surgery.   Her past history is significant for hypertension, elevated cholesterol.  She does report chest pain and has had a workup of this to include 2D  echocardiogram, which showed no evidence of significant disease.   SOCIAL HISTORY:  She is widowed with two children.  She does continue to  smoke cigarettes, although she is going to stop.  She does not drink  alcohol on a regular basis.   REVIEW OF SYSTEMS:  Negative for weight loss or weight gain or loss of  appetite.  CARDIAC:  Chest tightness and chest pressure with exertion.  PULMONARY:  No asthma or wheezing.  GI:  No dark stools or history of peptic ulcer disease.  GU:  No urinary frequency or burning.  LOWER EXTREMITIES:  Does note pain in her extremities with walking and  without walking.  She does report dizziness in the past.   ALLERGIES:  No medication allergies.   MEDICATIONS:  1. Tramadol HCL 50 mg 1-2 tablets daily as needed.  2. Aspirin 81 mg daily.  3. Hydrochlorothiazide 25 mg daily.  4. Pravastatin 40 mg daily.   PHYSICAL EXAMINATION:  Patient is a well-developed, well-nourished, thin  black female appearing stated age of 35.  Blood pressure is 148/74.  Pulse 78.  Respirations  18.  In general, she is in no acute distress.  Heart:  Regular rate and rhythm without murmur.  Chest:  Clear to  auscultation bilaterally.  Pulse status:  She has 2+ radial, 2+ femoral,  and 2+ dorsalis pedis pulses bilaterally.  Her carotid arteries are with  bilateral bruits, more so on the right than on the left.  Neurologically, she is grossly intact.   LABORATORY DATA:  Her carotid duplex reveals severe stenosis with  markedly elevated end-diastolic velocities on the right and moderate-to-  severe left carotid stenosis.   IMPRESSION:  Severe asymptomatic right internal carotid artery stenosis.   PLAN:  Patient will be admitted for right carotid endarterectomy and  Dacron patch angioplasty.  The procedure was discussed at length with  the patient and her family present.  Complications with potential for  stroke and cranial nerve injury, bleeding and infection were discussed  with the patient.  She understands and wishes to proceed for surgery  with a right carotid endarterectomy on 03/04/07.  She understands that  within all likelihood, this will be an overnight admission with  discharge to home on 02/04, as long as she has no complications.   Larina Earthly, M.D.  Electronically Signed   TFE/MEDQ  D:  02/26/2007  T:  02/27/2007  Job:  [id number]   cc:   Corwin Levins, MD

## 2010-06-16 NOTE — Discharge Summary (Signed)
[facility]  Patient:    MAVERICK, PATMAN                       MRN: [id number] Adm. Date:  [id number] Disc. Date: [id number] Attending:  Bonnetta Barry CC:         Rosalyn Gess. Norins, M.D. [facility]                           Discharge Summary  FINAL DIAGNOSIS:  Small bowel obstruction secondary to adhesions.  REASON FOR ADMISSION:  Severe abdominal pain, nausea and vomiting.  BRIEF HISTORY:  The patient is a 70 year old black female who presents to the emergency department with onset of severe abdominal pain.  This was associated with nausea and vomiting.  The patient was seen and evaluated in the emergency room.  A CT scan of the abdomen and pelvis showed thickening of the right colon.  There was a small amount of ascitic fluid.  The patient had mild elevation of her white blood cell count.  A diagnosis of colitis was entertained and she was admitted and started on intravenous antibiotics.  HOSPITAL COURSE:  The patient was admitted to the hospital and started on intravenous antibiotics.  The patient however, remained distended and abdominal x-rays were obtained on the second hospital day.  These showed dilated loops of small bowel consistent with small bowel obstruction.  General Surgery was consulted.  On clinical assessment, the patient was noted to have a tender abdomen, mostly in the right lower quadrant with rebound tenderness. Diagnosis of suspected closed loop obstruction was made and the patient was prepared urgently for the operating room.  The patient was taken to the operating room on the evening of July 18, 1999. She underwent exploratory laparotomy with lysis of adhesions for small bowel obstruction.  The postoperative course was relatively straight forward with the exception of uncontrolled hypertension.  The patient had gradual resolution of her ileus.  Nasogastric tube was removed on the third postoperative day.  She was seen in  consultation by the Internal Medicine Service for management of hypertension.  The patient had gradual progress of her ileus and started on a clear liquid diet.  She was advanced to a full liquid diet by the sixth postoperative day.  She then progressed to a regular diet on the seventh postoperative day and was prepared for discharge home on the eighth postoperative day.  Blood pressure was better controlled on oral Vasotec.  DISCHARGE PLANNING:  The patient is discharged home today, July 26, 1999 in good condition, tolerating a regular diet and ambulating independently.  She will be seen back in my office at [facility] in two weeks.  MEDICATIONS ON DISCHARGE: 1.  Vicodin as needed for pain. 2.  Vasotec 10 mg p.o. q. day for blood pressure control.  CONDITION ON DISCHARGE:  Improved. DD:  07/26/99 TD:  07/27/99 Job: [id number]

## 2010-06-16 NOTE — Discharge Summary (Signed)
NAME:  [name], [name]                          ACCOUNT NO.:  [id number]   MEDICAL RECORD NO.:  [id number]                   PATIENT TYPE:  INP   LOCATION:  0357                                 FACILITY:  [facility]   PHYSICIAN:  Gordy Savers, M.D. [facility]      DATE OF BIRTH:  [birth year]   DATE OF ADMISSION:  02/04/2003  DATE OF DISCHARGE:                                 DISCHARGE SUMMARY   FINAL DIAGNOSIS:  Hypertensive urgency.   DISCHARGE MEDICATIONS:  1. Hydrochlorothiazide 25 mg in the morning.  2. Labetalol 400 mg b.i.d.  3. Catapres 0.1 mg at bedtime.   HISTORY OF PRESENT ILLNESS:  The patient is a 70 year old black female with  a history of hypertension who has been off medications for 2-3 months.  She  presented to the emergency department complaining of a headache and left-  sided neck pain, and was noted to be quite hypertensive in the emergency  department setting.  Initial blood pressure 226/103.  She also reported some  transient visual loss in the right eye 5 days prior to admission.  The  patient was subsequently admitted for further evaluation and treatment of  her hypertensive urgency.   LABORATORY DATA AND HOSPITAL COURSE:  Patient was admitted to the hospital  and she was placed on antihypertensive medications.  Laboratory studies  included a CBC that was normal with a white count of 6.3, normal H&H.  Chemistries were also normal.  Cardiac enzymes were obtained and were  negative for injury.  Electrocardiogram revealed normal sinus rhythm,  prominent voltage for LVH, and suggestion of right atrial enlargement.  Studies pending at time of discharge included a 2-D echocardiogram.  Patient's blood pressure responded nicely to medications.  This included  labetalol and clonidine.  At the time of discharge, the patient's blood  pressure was well controlled.   DISPOSITION:  Patient will be discharged on the medical regimen listed  above.  Compliance issues were  addressed as well as cessation of smoking.  She has been asked to follow up with her primary care Synthia Fairbank later on this  week.   CONDITION ON DISCHARGE:  Improved.                                               Gordy Savers, M.D. [facility]    PFK/MEDQ  D:  02/06/2003  T:  02/06/2003  Job:  [id number]

## 2010-06-16 NOTE — Op Note (Signed)
[facility]  Patient:    [name], [name]                       MRN: [id number] Proc. Date: 07/18/99 Adm. Date:  [id number] Attending:  Bonnetta Barry CC:         Rosalyn Gess. Norins, M.D. [facility]                           Operative Report  PREOPERATIVE DIAGNOSIS:       Acute abdomen, small bowel obstruction.  POSTOPERATIVE DIAGNOSIS:      Acute abdomen, small bowel obstruction.  OPERATION/PROCEDURE:          Exploratory laparotomy with lysis of adhesions.  ANESTHESIA:                   General.  SURGEON:                      Velora Heckler, M.D.  ASSISTANTMarcial Pacas E. Earlene Plater, M.D.  ESTIMATED BLOOD LOSS:         Minimal.  PREPARATION:                  Betadine.  COMPLICATIONS:                None.  INDICATIONS:  The patient is a 70 year old black female admitted 48 hours ago with abdominal pain. CT scan showed moderate ascites and thickened small bowel. The patient has remained obstipated. Abdominal films today demonstrate high grade small bowel obstruction with multiple air fluid levels and dilated loops of small bowel. Physical examination is consistent with acute abdomen. The patient is now brought to the operating room for exploration for small bowel obstruction and possible closed loop obstruction.  DESCRIPTION OF PROCEDURE:  The patient was brought to the operating room and placed in the supine position on the operating room table. Following administration of general anesthesia, the patient was prepped and draped in the usual and strict aseptic fashion. After an adequate level of anesthesia had been obtained, a midline abdominal incision was made with the #10 blade. Dissection was carried through subcutaneous tissues. The fascia was incised in the midline. The peritoneal cavity was entered cautiously. A large amount of ascites, approximately 750 cc, was evacuated. There are omental adhesions into the pelvis, probably to  the vaginal cuff. The incision is extended to expose the pelvis. There are multiple dilated loops of small bowel. Again, there is extensive ascites. Omentum is mobilized from its pelvic adhesions and hemostasis was obtained with the electrocautery. The small bowel was then run and there was a point of obstruction in the mid ilium, secondary to the band of omentum which was causing the high grade obstruction. With release of the omentum, the obstruction was released. The terminal ileum was somewhat adherent to the right pelvic side wall. Adhesions were lysed with sharp dissection. Hemostasis was obtained with the electrocautery. The appendix was surgically absent. The uterus is surgically absent. The small bowel was then run from the ileocecal valve up to the ligament of Treitz. There is no evidence of ischemia or infarction. Nasogastric tube was properly positioned in the stomach. The liver is grossly normal. The gallbladder is grossly normal. The colon is grossly normal. At this point, the abdomen is closed,  following irrigation with warm saline. The small bowel is returned to its normal position. The fascia was reapproximated in the midline with running #1 PDS suture. Subcutaneous tissue was irrigated and hemostasis was obtained with the electrocautery. Skin is closed with stainless steel staples. Sterile and occlusive dressings were applied. The patient is awakened from anesthesia and brought to the recovery room in stable condition. The patient tolerated the procedure well. DD:  07/18/99 TD:  07/21/99 Job: [id number]

## 2010-06-16 NOTE — H&P (Signed)
[facility]  Patient:    [name], [name]                       MRN: [id number] Adm. Date:  [id number] Attending:  Duke Salvia                         History and Physical  CHIEF COMPLAINT:  Severe abdominal pain.  HISTORY OF PRESENT ILLNESS:  [name] is a 70 year old widowed black female with no prior GI history and was awakened from sleep about 1 a.m. by severe abdominal pain which she graded at 10/10 in a general distribution.  She did have nausea and vomiting.  Patient reports she had mild chill and felt feverish at home with no documented temperature.  Patient reports that she has had two loose stools since that time, which she had not checked out for color, blood or mucus.  In the ER, she was evaluated and found to have an unremarkable acute abdominal series.  CT scan was suspicious for thickening of the colon on the right.  She had a rectal contrast; a CT scan again suggestive of possible bowel wall thickening.  There was no evidence of diverticulitis or abscess.  Patient did have a leukocytosis.  She was seen in consultation by Dr. Gita Kudo, according to the ER doctor, who felt the patient should have IV antibiotics and admitted for observation.  He did not feel she had a surgical abdomen.  At the time of my examination, the patient is sedated on Demerol and in no significant distress.  PAST SURGICAL HISTORY:  Appendectomy, TAH, BSO, tonsillectomy.  MEDICAL HISTORY:  Usual childhood diseases.  Hypertension.  Hyperlipidemia. Degenerative joint disease.  SOCIAL HISTORY:  Patient is a gravida 3, para 2, with one SAB.  Habits: Patient smoke one-half pack per day and has approximately a 30-pack-year smoking history.  She uses alcohol on a social basis.  MEDICATIONS: 1. Vasotec 10 mg q.d. 2. Aspirin two to three tablets daily. 3. BC Powders one or two daily.  ALLERGIES:  She says she has no known drug allergies.  HEALTH  MAINTENANCE:  Patient reports her last mammogram was about two years ago.  She does not recall when she had a Pap and pelvic exam, although she is status post hysterectomy.  She has had no colorectal cancer screening.  FAMILY HISTORY:  Positive for CAD and MI in her mother.  Positive for diabetes.  Positive for hypertension.  Negative for breast cancer or colon cancer.  SOCIAL HISTORY:  Patient is a widow x 6 years after 27 years of marriage.  She has two sons and two grandchildren.  She has several sisters.  Patient takes care of her grandchildren and does not have otherwise gainful employment.  She does live alone and is independent in her ADLs.  REVIEW OF SYSTEMS:  Negative for any cardiac or pulmonary problems.  She does have knee and shoulder pain.  She had no prior GI complaints.  PHYSICAL EXAMINATION:  VITAL SIGNS:  Temperature is 97.  Blood pressure 180/70, after giving clonidine.  Heart rate was 80.  Respirations were 16.  GENERAL APPEARANCE:  An obese black female who is somewhat somnolent after Demerol and Phenergan, who is in no acute distress.  HEENT:  Normocephalic, atraumatic.  EACs were clear.  Oropharynx with native dentition, missing several teeth, with one carious-appearing molar at the left mandible.  Posterior pharynx  was clear.  NECK:  Supple.  There was no thyromegaly.  NODES:  No adenopathy noted in the cervical, supraclavicular or inguinal regions.  CHEST:  No CVA tenderness.  LUNGS:  Clear to auscultation and percussion.  CARDIOVASCULAR:  Radial pulse 2+.  She had a regular rate and rhythm without murmurs, rubs, or gallops.  She had no JVD.  She had no carotid bruits.  ABDOMEN:  Patient had positive bowel sounds.  Her abdomen was soft.  She had tenderness to deep palpation in the right lower quadrant, with no mass or abnormality felt; patient has had Demerol.  RECTAL:  Exam deferred to EDP, which was evidently unremarkable.  EXTREMITIES:   Without clubbing, cyanosis, edema or deformity.  NEUROLOGIC:  Patient is somewhat somnolent after narcotic pain medication but otherwise all right.  She had no focal deficits.  DATA BASE:  Acute abdominal series was negative.  CT scan by verbal report from the ED physician was notable for thickened bowel wall on the right.  UA was cloudy with 0-5 wbcs and rare bacteria.  Hemoglobin 18.1, hematocrit 54.6.  WBC was 16,600 with 93% segs, 4% lymphs, 2% monos.  Sodium was 139, potassium 3.6, chloride 104, CO2 of 25, BUN of 13, creatinine 0.8.   Glucose was 201.  Liver functions were normal.  ASSESSMENT AND PLAN: 1. Gastrointestinal/infectious disease:  Patient with abdominal pain and    leukocytosis, ? if fever.  Radiology studies not conclusive but suggestive    of possible inflammatory bowel disease.  Informal surgical consult did not    reveal patient with a surgical abdomen.  Patient does not have obstruction.    Plan:  Patient is admitted to a regular bed.  She will be started on Unasyn    3 g IV q.6h. and first dose has been administered in the emergency room.    Will obtain a gastroenterology consult on Monday, July 17, 1999. 2. Hyperglycemia:  Patient with a markedly elevated glucose without having had    much to eat.  Question whether patient has underlying diabetes.   Plan:  We    will check a hemoglobin A1c. 3. Hypertension:  Patient reports that she is on Vasotec alone at home and her    blood pressure has been controlled.  In the emergency room, she was    markedly hypertensive, as high as 220/110.  Plan:  Will continue Vasotec,    will add a diuretic and monitor for a need of additional medications. DD:  07/16/99 TD:  07/17/99 Job: [id number]

## 2010-06-16 NOTE — Consult Note (Signed)
[facility]  Patient:    [name], [name]                       MRN: [id number] Proc. Date: 07/18/99 Adm. Date:  [id number] Attending:  Duke Salvia CC:         Rosalyn Gess. Norins, M.D. [facility]             Velora Heckler, M.D. (office)                          Consultation Report  REASON FOR CONSULTATION:  Abdominal pain, small-bowel obstruction.  REFERRING PHYSICIAN:  Dr. Illene Regulus.  BRIEF HISTORY:  The patient is a 70 year old black female admitted onto the medical service on July 16, 1999 from the emergency department.  The patient presented with abdominal pain which awakened her from sleep at 1 a.m. on July 16, 1999.  The patient felt mild chill.  She had profuse diarrhea following onset of pain.  The patient was seen in the emergency department, underwent CT scan of the abdomen which demonstrated a thickening of the distal small bowel.  There was a moderate amount of free flowing ascites.  The patient was admitted on the medical service and started on intravenous antibiotics.  Over the ensuing 48 hours, the patients white blood cell count fell from 16,600 with 93% segmented neutrophils to 11,000 with 82% segmented neutrophils.  The patient has had a low-grade temperature of 98.8 to 99.9 degrees.  However, the patient had increased abdominal distension, increased abdominal tenderness today, and abdominal x-ray demonstrates multiple air fluid levels with dilated loops of small bowel and contrast remaining in the right colon consistent with high-grade small bowel obstruction.  General surgery is consulted at this time for consideration for operative intervention.  PAST MEDICAL HISTORY:  Status post hysterectomy, status post appendectomy, status post tonsillectomy, history of hypertension, history of hyperlipidemia.  MEDICATIONS:  Vasotec, aspirin, BC powders.  ALLERGIES:  No known drug allergies.  SOCIAL HISTORY:  The patient is  accompanied by multiple family members.  She has a 30-pack-year tobacco history. She drinks approximately a six-pack of beer a week.  FAMILY HISTORY:  History is notable for coronary artery disease with myocardial infarction in the patients mother.  REVIEW OF SYSTEMS:  Systems reviewed documented in the medical record.  PHYSICAL EXAMINATION:  GENERAL:  A 70 year old black female in no acute distress.  VITAL SIGNS:  Temperature 98.8, pulse 95, respirations 18, blood pressure 114/62.  HEENT:  Exam shows her to be normocephalic.  Sclerae are clear.  There is a nasogastric tube placed.  Mucous membranes are moist.  NECK:  Supple without masses.  Thyroid was without nodularity.  LUNGS:  Clear to auscultation bilaterally.  There is no costovertebral angle tenderness.  CARDIAC:  Exam shows a regular rate and rhythm without murmur.  ABDOMEN:  Soft, moderately distended. There are no bowel sounds on auscultation with the nasogastric tube clamped.  There are well-healed surgical wounds in the Pfannenstiel and right lower quadrant.  There are striae on the abdominal wall.  There is tenderness to percussion and palpation essentially throughout the abdomen but maximum in the right lower quadrant. There are no discreet masses but there is a "doughy" feeling in the right lower quadrant.  There is rebound tenderness in the right lower quadrant.  EXTREMITIES:  Nontender without edema.  NEUROLOGICAL EXAM:  The patient is alert and oriented to  person, place and time without focal neurologic deficit.  LABORATORY STUDIES:  Studies dated July 18, 1999; hemoglobin 15.2, hematocrit 43.9%, white count 11.0, platelet count 182,000.  Differential shows 82% neutrophils.  Chemistry profile shows sodium 138, potassium 3.4, chloride 105, bicarb 27, BUN 23, creatinine 0.8, glucose 128.  Abdominal x-ray.  Plain films today in flat and upright positions show dilated loops of small bowel with multiple air  fluid levels.  Contrast remains in the right colon.  There is no gas in the colon.  IMPRESSION:  Acute abdomen, likely closed loop small-bowel obstruction.  PLAN: 1. Agree with empiric antibiotics. 2. Agree with nasogastric tube decompression and intravenous hydration. 3. Recommend exploratory laparotomy this evening for cause of small-bowel    obstruction and possible resection of small bowel of ischemia or infarction    secondary to closed loop obstruction.  I discussed all this with the patient and family.  Risks and benefits of the procedure are discussed.  Alternative diagnoses are reviewed.  The possibility of colostomy is discussed.  The patient and her family understand and agree to proceed to the exploratory laparotomy later this evening.  I will make arrangement with the operating room. DD:  07/18/99 TD:  07/19/99 Job: [id number]

## 2010-10-19 LAB — CBC
HCT: 44.5
Hemoglobin: 15.4 — ABNORMAL HIGH
MCHC: 34.5
Platelets: 216
RDW: 13

## 2010-10-19 LAB — PROTIME-INR
INR: 0.9
Prothrombin Time: 12.2

## 2010-10-19 LAB — URINALYSIS, ROUTINE W REFLEX MICROSCOPIC
Nitrite: NEGATIVE
Protein, ur: NEGATIVE
Urobilinogen, UA: 1

## 2010-10-19 LAB — COMPREHENSIVE METABOLIC PANEL
BUN: 18
Calcium: 10.3
Glucose, Bld: 103 — ABNORMAL HIGH
Sodium: 140
Total Protein: 7.6

## 2010-10-19 LAB — TYPE AND SCREEN: ABO/RH(D): A POS

## 2010-10-20 LAB — BASIC METABOLIC PANEL
BUN: 6
CO2: 32
Chloride: 104
Creatinine, Ser: 0.76
GFR calc Af Amer: >60

## 2010-10-20 LAB — CBC
HCT: 34.9 — ABNORMAL LOW
Platelets: 146 — ABNORMAL LOW
RDW: 13.3
WBC: 7.1

## 2010-11-10 ENCOUNTER — Encounter: Payer: Self-pay | Admitting: Internal Medicine

## 2010-11-10 ENCOUNTER — Ambulatory Visit (INDEPENDENT_AMBULATORY_CARE_PROVIDER_SITE_OTHER): Payer: Medicare Other | Admitting: Internal Medicine

## 2010-11-10 VITALS — BP 140/60 | HR 68 | Temp 98.0°F | Ht 64.0 in | Wt 119.2 lb

## 2010-11-10 DIAGNOSIS — Z23 Encounter for immunization: Secondary | ICD-10-CM

## 2010-11-10 DIAGNOSIS — M1710 Unilateral primary osteoarthritis, unspecified knee: Secondary | ICD-10-CM

## 2010-11-10 DIAGNOSIS — I10 Essential (primary) hypertension: Secondary | ICD-10-CM

## 2010-11-10 DIAGNOSIS — J449 Chronic obstructive pulmonary disease, unspecified: Secondary | ICD-10-CM

## 2010-11-10 DIAGNOSIS — F411 Generalized anxiety disorder: Secondary | ICD-10-CM

## 2010-11-10 MED ORDER — CELECOXIB 100 MG PO CAPS: 100.0000 mg | ORAL_CAPSULE | Freq: Two times a day (BID) | ORAL | Status: DC

## 2010-11-10 NOTE — Patient Instructions (Addendum)
You had the flu shot today Please remember to followup with your GYN for the mammogram Please stop smoking Take all new medications as prescribed - the celebrex for knee pain Continue all other medications as before Please return in 6 months, or sooner if needed

## 2010-11-12 ENCOUNTER — Encounter: Payer: Self-pay | Admitting: Internal Medicine

## 2010-11-12 NOTE — Progress Notes (Signed)
Subjective:    Patient ID: [name], female    DOB: 1940-12-15, 70 y.o.   MRN: [id number]  HPI  Here to f/u; overall doing ok;  Pt denies chest pain, increased sob or doe, wheezing, orthopnea, PND, increased LE swelling, palpitations, dizziness or syncope.  Pt denies new neurological symptoms such as new headache, or facial or extremity weakness or numbness   Pt denies polydipsia, polyuria.  Has been trying to cut back on smoking - now down to 1/3 ppd, but not ready to quit yet.   Pt denies fever, wt loss, night sweats, loss of appetite, or other constitutional symptoms  Overall good compliance with treatment, and good medicine tolerability.  Does have a small mass to the right post calf increased in size over 6 months, and wonders if it might be related to ongoing right knee pain without swelling, but with risk of giveaway and fall as she has had to catch herself from falling several times in the last 3 months, not seemingly assoc with stairs or other specific activity.  Pain approx 5/10 at times.    Denies worsening depressive symptoms, suicidal ideation, or panic, though has ongoing anxiety, not increased recently.  Past Medical History  Diagnosis Date  . THYROID NODULE, RIGHT 02/01/2009  . GLUCOSE INTOLERANCE 11/04/2007  . HYPERLIPIDEMIA 02/12/2007  . HYPOKALEMIA 11/04/2007  . ANXIETY 02/12/2007  . DEPRESSION 02/12/2007  . HYPERTENSION 11/07/2006  . CORONARY ARTERY DISEASE 11/04/2007  . CAROTID ARTERY STENOSIS, BILATERAL 02/24/2007  . Unspecified Peripheral Vascular Disease 02/12/2007  . ASTHMATIC BRONCHITIS, ACUTE 04/22/2008  . ALLERGIC RHINITIS 02/12/2007  . COPD 11/04/2007  . GERD 02/12/2007  . CONSTIPATION 02/12/2007  . DEGENERATIVE JOINT DISEASE, FINGERS 03/04/2008  . LOW BACK PAIN, CHRONIC 02/01/2009  . BACK PAIN, RIGHT 12/05/2009  . LEG PAIN, BILATERAL 02/01/2009  . OSTEOPOROSIS 02/12/2007  . DIZZINESS 02/12/2007  . INSOMNIA-SLEEP DISORDER-UNSPEC 08/04/2007  . FATIGUE 02/12/2007  . CHEST PAIN  02/12/2007  . Coronary artery disease 9/09    nonobstructive by cath  . Peripheral vascular disease 9/09    left carotid 60-80%   . Impaired glucose tolerance 05/11/2010  . Leg pain, bilateral 05/12/2010   Past Surgical History  Procedure Date  . S/p bowel obstruction   . Abdominal hysterectomy   . Appendectomy   . Oophorectomy   . Tonsillectomy   . S/p right cea 2/09  . S/p right thyroid nodule biopsy March 2011    negative    reports that she has been smoking.  She does not have any smokeless tobacco history on file. She reports that she does not drink alcohol or use illicit drugs. family history includes Alcohol abuse in her other; Cancer in her son; Diabetes in her other; Heart attack in her other; Hypertension in her other; and Stroke in her other. Allergies  Allergen Reactions  . Tramadol     constipation   Current Outpatient Prescriptions on File Prior to Visit  Medication Sig Dispense Refill  . alendronate (FOSAMAX) 70 MG tablet Take 1 tablet (70 mg total) by mouth every 7 (seven) days. Take with a full glass of water on an empty stomach.  12 tablet  3  . aspirin 81 MG EC tablet Take 81 mg by mouth daily.        Marland Kitchen atenolol-chlorthalidone (TENORETIC) 50-25 MG per tablet Take 1 tablet by mouth daily.        . cholecalciferol (VITAMIN D) 1000 UNITS tablet Take 1,000 Units by mouth daily.        Marland Kitchen  citalopram (CELEXA) 10 MG tablet Take 1 tablet (10 mg total) by mouth daily.  90 tablet  3  . cloNIDine (CATAPRES) 0.2 MG tablet Take 0.2 mg by mouth 2 (two) times daily.        . simvastatin (ZOCOR) 40 MG tablet Take 40 mg by mouth daily.         Review of Systems Review of Systems  Constitutional: Negative for diaphoresis and unexpected weight change.  HENT: Negative for drooling and tinnitus.   Eyes: Negative for photophobia and visual disturbance.  Respiratory: Negative for choking and stridor.   Gastrointestinal: Negative for vomiting and blood in stool.  Genitourinary:  Negative for hematuria and decreased urine volume.  Musculoskeletal: Negative for gait problem. except for the above Skin: Negative for color change and wound.  Neurological: Negative for tremors and numbness.  Psychiatric/Behavioral: Negative for decreased concentration. The patient is not hyperactive.      Objective:   Physical Exam BP 140/60  Pulse 68  Temp(Src) 98 F (36.7 C) (Oral)  Ht 5\' 4"  (1.626 m)  Wt 119 lb 4 oz (54.091 kg)  BMI 20.47 kg/m2  SpO2 98% Physical Exam  VS noted Constitutional: Pt appears well-developed and well-nourished.  HENT: Head: Normocephalic.  Right Ear: External ear normal.  Left Ear: External ear normal.  Eyes: Conjunctivae and EOM are normal. Pupils are equal, round, and reactive to light.  Neck: Normal range of motion. Neck supple.  Cardiovascular: Normal rate and regular rhythm.   Pulmonary/Chest: Effort normal and breath sounds decreased bilat, no wheeze or rales.  Abd:  Soft, NT, non-distended, + BS Neurological: Pt is alert. No cranial nerve deficit.  Skin: Skin is warm. No erythema.  Psychiatric: Pt behavior is normal. Thought content normal. 1+ nervous Right knee with bony DJD changes, no effusion but with crepitus on ROM Right post calf with 2 cm fatty mass c/w lipoma    Assessment & Plan:

## 2010-11-12 NOTE — Assessment & Plan Note (Signed)
stable overall by hx and exam, most recent data reviewed with pt, and pt to continue medical treatment as before  BP Readings from Last 3 Encounters:  11/10/10 140/60  12/05/09 130/62  03/03/09 112/52

## 2010-11-12 NOTE — Assessment & Plan Note (Signed)
stable overall by hx and exam, most recent data reviewed with pt, and pt to continue medical treatment as before  Lab Results  Component Value Date   WBC 7.1 05/12/2010   HGB 14.1 05/12/2010   HCT 41.0 05/12/2010   PLT 196.0 05/12/2010   GLUCOSE 128* 05/12/2010   CHOL 200 05/12/2010   TRIG 54.0 05/12/2010   HDL 124.80 05/12/2010   LDLDIRECT 77.3 02/03/2009   LDLCALC 64 05/12/2010   ALT 14 05/12/2010   AST 24 05/12/2010   NA 144 05/12/2010   K 3.8 05/12/2010   CL 105 05/12/2010   CREATININE 0.9 05/12/2010   BUN 22 05/12/2010   CO2 30 05/12/2010   TSH 0.30* 05/12/2010   INR 0.9 02/28/2007   HGBA1C 5.4 02/03/2009

## 2010-11-12 NOTE — Assessment & Plan Note (Signed)
Mild to mod, for celebrex asd,  to f/u any worsening symptoms or concerns, I think she should see ortho due to the giveaway tendency indicating ? ligament abnormality but she declines at this time

## 2010-11-12 NOTE — Assessment & Plan Note (Signed)
stable overall by hx and exam, most recent data reviewed with pt, and pt to continue medical treatment as before, pt urged to quit smoking SpO2 Readings from Last 3 Encounters:  11/10/10 98%  12/05/09 98%  03/03/09 99%

## 2011-01-03 ENCOUNTER — Other Ambulatory Visit: Payer: Self-pay | Admitting: *Deleted

## 2011-01-03 MED ORDER — SIMVASTATIN 40 MG PO TABS: 40.0000 mg | ORAL_TABLET | Freq: Every day | ORAL | Status: DC

## 2011-01-03 MED ORDER — CLONIDINE HCL 0.2 MG PO TABS: 0.2000 mg | ORAL_TABLET | Freq: Two times a day (BID) | ORAL | Status: DC

## 2011-01-03 NOTE — Telephone Encounter (Signed)
R'cd fax from [facility] for refill of Simvastatin

## 2011-01-03 NOTE — Telephone Encounter (Signed)
R'cd fax from [facility] for Clonidine refill  Last OV-10/2010  Last filled-12/05/2009

## 2011-02-26 ENCOUNTER — Other Ambulatory Visit: Payer: Self-pay | Admitting: Internal Medicine

## 2011-05-11 ENCOUNTER — Encounter: Payer: Self-pay | Admitting: Internal Medicine

## 2011-05-11 ENCOUNTER — Ambulatory Visit (INDEPENDENT_AMBULATORY_CARE_PROVIDER_SITE_OTHER): Payer: Medicare Other | Admitting: Internal Medicine

## 2011-05-11 ENCOUNTER — Other Ambulatory Visit (INDEPENDENT_AMBULATORY_CARE_PROVIDER_SITE_OTHER): Payer: Medicare Other

## 2011-05-11 VITALS — BP 172/102 | HR 70 | Temp 98.1°F | Ht 64.0 in | Wt 120.4 lb

## 2011-05-11 DIAGNOSIS — R7309 Other abnormal glucose: Secondary | ICD-10-CM

## 2011-05-11 DIAGNOSIS — M25519 Pain in unspecified shoulder: Secondary | ICD-10-CM

## 2011-05-11 DIAGNOSIS — M1710 Unilateral primary osteoarthritis, unspecified knee: Secondary | ICD-10-CM

## 2011-05-11 DIAGNOSIS — F329 Major depressive disorder, single episode, unspecified: Secondary | ICD-10-CM

## 2011-05-11 DIAGNOSIS — E785 Hyperlipidemia, unspecified: Secondary | ICD-10-CM

## 2011-05-11 DIAGNOSIS — R55 Syncope and collapse: Secondary | ICD-10-CM

## 2011-05-11 DIAGNOSIS — R7302 Impaired glucose tolerance (oral): Secondary | ICD-10-CM

## 2011-05-11 DIAGNOSIS — I10 Essential (primary) hypertension: Secondary | ICD-10-CM

## 2011-05-11 DIAGNOSIS — M25562 Pain in left knee: Secondary | ICD-10-CM

## 2011-05-11 DIAGNOSIS — M25569 Pain in unspecified knee: Secondary | ICD-10-CM

## 2011-05-11 DIAGNOSIS — Z0000 Encounter for general adult medical examination without abnormal findings: Secondary | ICD-10-CM

## 2011-05-11 LAB — URINALYSIS, ROUTINE W REFLEX MICROSCOPIC
Leukocytes, UA: NEGATIVE
Nitrite: NEGATIVE
Specific Gravity, Urine: 1.01 (ref 1.000–1.030)
Urine Glucose: NEGATIVE
Urobilinogen, UA: 1 (ref 0.0–1.0)

## 2011-05-11 LAB — BASIC METABOLIC PANEL
BUN: 15 mg/dL (ref 6–23)
Chloride: 102 mEq/L (ref 96–112)
GFR: 86.31 mL/min (ref >60.00)
Potassium: 4 mEq/L (ref 3.5–5.1)

## 2011-05-11 LAB — HEPATIC FUNCTION PANEL
ALT: 14 U/L (ref 0–35)
AST: 23 U/L (ref 0–37)
Bilirubin, Direct: 0.1 mg/dL (ref 0.0–0.3)
Total Bilirubin: 0.4 mg/dL (ref 0.3–1.2)

## 2011-05-11 LAB — CBC WITH DIFFERENTIAL/PLATELET
Basophils Absolute: 0 10*3/uL (ref 0.0–0.1)
Hemoglobin: 14 g/dL (ref 12.0–15.0)
Lymphocytes Relative: 22.5 % (ref 12.0–46.0)
Monocytes Relative: 10.4 % (ref 3.0–12.0)
Neutro Abs: 3.9 10*3/uL (ref 1.4–7.7)
Neutrophils Relative %: 64.3 % (ref 43.0–77.0)
RBC: 4.69 Mil/uL (ref 3.87–5.11)
RDW: 14.9 % — ABNORMAL HIGH (ref 11.5–14.6)
WBC: 6 10*3/uL (ref 4.5–10.5)

## 2011-05-11 LAB — TSH: TSH: 0.29 u[IU]/mL — ABNORMAL LOW (ref 0.35–5.50)

## 2011-05-11 LAB — HEMOGLOBIN A1C: Hgb A1c MFr Bld: 5.7 % (ref 4.6–6.5)

## 2011-05-11 LAB — LIPID PANEL
LDL Cholesterol: 77 mg/dL (ref 0–99)
Total CHOL/HDL Ratio: 2

## 2011-05-11 MED ORDER — DULOXETINE HCL 30 MG PO CPEP: 30.0000 mg | ORAL_CAPSULE | Freq: Every day | ORAL | Status: DC

## 2011-05-11 MED ORDER — CELECOXIB 200 MG PO CAPS: 200.0000 mg | ORAL_CAPSULE | Freq: Two times a day (BID) | ORAL | Status: AC | PRN

## 2011-05-11 NOTE — Assessment & Plan Note (Addendum)
Etiology unclear, for orthostatics today, ECG reviewed as per emr, also for carotid doppler f/u and head MRI, card consult  Note: time for pt hx, exam, review of record wit pt in room, determination of diagnosis, and plan for further eval and tx is > 40 min

## 2011-05-11 NOTE — Patient Instructions (Signed)
Take all new medications as prescribed - the cymbalta at 30 mg per day for pain and low mood OK to stop the citalopram and tramadol as you have Continue all other medications as before, including the celebrex Please have the pharmacy call with any refills you may need. Your EKG was Ok today Please drink a bit more fluids daiily Please go to LAB in the Basement for the blood and/or urine tests to be done today You will be contacted regarding the referral for: carotid ultrasound, head MRI, echocardiogram, cardiology consult, as well as orthopedic You will be contacted by phone if any changes need to be made immediately.  Otherwise, you will receive a letter about your results with an explanation. Please return in 3 months, or sooner if needed

## 2011-05-12 ENCOUNTER — Encounter: Payer: Self-pay | Admitting: Internal Medicine

## 2011-05-12 NOTE — Assessment & Plan Note (Signed)
stable overall by hx and exam, most recent data reviewed with pt, and pt to continue medical treatment as before  Lab Results  Component Value Date   LDLCALC 77 05/11/2011

## 2011-05-12 NOTE — Progress Notes (Signed)
Subjective:    Patient ID: [name], female    DOB: 01/01/1941, 71 y.o.   MRN: [id number]  HPI  Here to f/u;  C/o ongoing and mild worsening left shoulder pain, was afraid to take celebrex before but ok with doing this now,  Pt denies chest pain, increased sob or doe, wheezing, orthopnea, PND, increased LE swelling, palpitations, but did suffer syncope x 1 approx 1 wk ago - fell in the BR between bed and dresser, no injury, no prior symptoms, nor after as well, no incontinence and no syncope since then.   Pt denies new neurological symptoms such as new headache, or facial or extremity weakness or numbness   Pt denies polydipsia, polyuria.  Pt states overall good compliance with meds, trying to follow lower cholesterol diet, wt overall stable but little exercise however, has ongoing knee pain as well worse recently now mod but no effusion, and deos not think that the origin of the fall.  Has had worsening mild depressive symptoms as well, but no suicidal ideation, or panic. Past Medical History  Diagnosis Date  . THYROID NODULE, RIGHT 02/01/2009  . GLUCOSE INTOLERANCE 11/04/2007  . HYPERLIPIDEMIA 02/12/2007  . HYPOKALEMIA 11/04/2007  . ANXIETY 02/12/2007  . DEPRESSION 02/12/2007  . HYPERTENSION 11/07/2006  . CORONARY ARTERY DISEASE 11/04/2007  . CAROTID ARTERY STENOSIS, BILATERAL 02/24/2007  . Unspecified Peripheral Vascular Disease 02/12/2007  . ASTHMATIC BRONCHITIS, ACUTE 04/22/2008  . ALLERGIC RHINITIS 02/12/2007  . COPD 11/04/2007  . GERD 02/12/2007  . CONSTIPATION 02/12/2007  . DEGENERATIVE JOINT DISEASE, FINGERS 03/04/2008  . LOW BACK PAIN, CHRONIC 02/01/2009  . BACK PAIN, RIGHT 12/05/2009  . LEG PAIN, BILATERAL 02/01/2009  . OSTEOPOROSIS 02/12/2007  . DIZZINESS 02/12/2007  . INSOMNIA-SLEEP DISORDER-UNSPEC 08/04/2007  . FATIGUE 02/12/2007  . CHEST PAIN 02/12/2007  . Coronary artery disease 9/09    nonobstructive by cath  . Peripheral vascular disease 9/09    left carotid 60-80%   . Impaired glucose  tolerance 05/11/2010  . Leg pain, bilateral 05/12/2010   Past Surgical History  Procedure Date  . S/p bowel obstruction   . Abdominal hysterectomy   . Appendectomy   . Oophorectomy   . Tonsillectomy   . S/p right cea 2/09  . S/p right thyroid nodule biopsy March 2011    negative    reports that she has been smoking.  She does not have any smokeless tobacco history on file. She reports that she does not drink alcohol or use illicit drugs. family history includes Alcohol abuse in her other; Cancer in her son; Diabetes in her other; Heart attack in her other; Hypertension in her other; and Stroke in her other. Allergies  Allergen Reactions  . Tramadol     constipation   Current Outpatient Prescriptions on File Prior to Visit  Medication Sig Dispense Refill  . alendronate (FOSAMAX) 70 MG tablet Take 1 tablet (70 mg total) by mouth every 7 (seven) days. Take with a full glass of water on an empty stomach.  12 tablet  3  . aspirin 81 MG EC tablet Take 81 mg by mouth daily.        Marland Kitchen atenolol-chlorthalidone (TENORETIC) 50-25 MG per tablet Take 1 tablet by mouth daily.        . cholecalciferol (VITAMIN D) 1000 UNITS tablet Take 1,000 Units by mouth daily.        . cloNIDine (CATAPRES) 0.2 MG tablet Take 1 tablet (0.2 mg total) by mouth 2 (two) times daily.  180 tablet  2  . simvastatin (ZOCOR) 40 MG tablet Take 1 tablet (40 mg total) by mouth daily.  90 tablet  2  . DULoxetine (CYMBALTA) 30 MG capsule Take 1 capsule (30 mg total) by mouth daily.  30 capsule  11   Review of Systems Review of Systems  Constitutional: Negative for diaphoresis and unexpected weight change.  HENT: Negative for drooling and tinnitus.   Eyes: Negative for photophobia and visual disturbance.  Respiratory: Negative for choking and stridor.   Gastrointestinal: Negative for vomiting and blood in stool.  Genitourinary: Negative for hematuria and decreased urine volume.  Neurological: Negative for tremors and  numbness.  Psychiatric/Behavioral: Negative for decreased concentration. The patient is not hyperactive.       Objective:   Physical Exam BP 172/102  Pulse 70  Temp(Src) 98.1 F (36.7 C) (Oral)  Ht 5\' 4"  (1.626 m)  Wt 120 lb 6 oz (54.602 kg)  BMI 20.66 kg/m2  SpO2 97% Physical Exam  VS noted Constitutional: Pt appears well-developed and well-nourished.  HENT: Head: Normocephalic.  Right Ear: External ear normal.  Left Ear: External ear normal.  Eyes: Conjunctivae and EOM are normal. Pupils are equal, round, and reactive to light.  Neck: Normal range of motion. Neck supple.  Cardiovascular: Normal rate and regular rhythm.   Pulmonary/Chest: Effort normal and breath sounds normal.  Abd:  Soft, NT, non-distended, + BS Neurological: Pt is alert. No cranial nerve deficit. motor gross normal Skin: Skin is warm. No erythema.  Psychiatric: Pt behavior is normal. Thought content normal. + depressed affect Left shoulder diffuse tender, with pain to limited abduction to 90 degrees only.  Bilat knees with crepitus, no effusion    Assessment & Plan:

## 2011-05-12 NOTE — Assessment & Plan Note (Signed)
For cymbalta asd, should help with knee and shoulder pain as well

## 2011-05-12 NOTE — Assessment & Plan Note (Signed)
stable overall by hx and exam, most recent data reviewed with pt, and pt to continue medical treatment as before  Lab Results  Component Value Date   HGBA1C 5.7 05/11/2011

## 2011-05-12 NOTE — Assessment & Plan Note (Signed)
stable overall by hx and exam, most recent data reviewed with pt, and pt to continue medical treatment as before  BP Readings from Last 3 Encounters:  05/11/11 172/102  11/10/10 140/60  12/05/09 130/62

## 2011-05-12 NOTE — Assessment & Plan Note (Signed)
With shoudler pain - for celebrex, and refer orthopedic

## 2011-05-16 ENCOUNTER — Other Ambulatory Visit: Payer: Self-pay | Admitting: Cardiology

## 2011-05-16 ENCOUNTER — Telehealth: Payer: Self-pay

## 2011-05-16 DIAGNOSIS — R55 Syncope and collapse: Secondary | ICD-10-CM

## 2011-05-16 MED ORDER — CITALOPRAM HYDROBROMIDE 10 MG PO TABS: 10.0000 mg | ORAL_TABLET | Freq: Every day | ORAL | Status: AC

## 2011-05-16 NOTE — Telephone Encounter (Signed)
Patient informed. 

## 2011-05-16 NOTE — Telephone Encounter (Signed)
The patient needs alternative for Cymbalta the cost for her is $65. Please advise

## 2011-05-16 NOTE — Telephone Encounter (Signed)
Ok to change to celexa 10 qd

## 2011-05-22 ENCOUNTER — Institutional Professional Consult (permissible substitution): Payer: Medicare Other | Admitting: Internal Medicine

## 2011-05-22 ENCOUNTER — Other Ambulatory Visit (HOSPITAL_COMMUNITY): Payer: Medicare Other

## 2011-05-25 ENCOUNTER — Other Ambulatory Visit (HOSPITAL_COMMUNITY): Payer: Medicare Other

## 2011-05-29 ENCOUNTER — Telehealth: Payer: Self-pay | Admitting: *Deleted

## 2011-05-29 ENCOUNTER — Other Ambulatory Visit: Payer: Medicare Other

## 2011-05-29 NOTE — Telephone Encounter (Signed)
Called the patient informed Celexa had been sent in as alternative to cymbalta on 05/16/11 per pt. Request for less expensive medication.

## 2011-05-29 NOTE — Telephone Encounter (Signed)
Left msg on vm requesting to speak with nurse concerning rx that was suppose to been sent to pharmacy... 05/29/11@12 :45pm/LMB

## 2011-06-04 ENCOUNTER — Encounter: Payer: Self-pay | Admitting: Internal Medicine

## 2011-06-04 ENCOUNTER — Ambulatory Visit
Admission: RE | Admit: 2011-06-04 | Discharge: 2011-06-04 | Disposition: A | Discharge status: Home / Self Care | Payer: Medicare Other | Source: Ambulatory Visit | Attending: Internal Medicine | Admitting: Internal Medicine

## 2011-06-04 DIAGNOSIS — R55 Syncope and collapse: Secondary | ICD-10-CM

## 2011-06-14 ENCOUNTER — Other Ambulatory Visit: Payer: Self-pay

## 2011-06-14 ENCOUNTER — Ambulatory Visit (INDEPENDENT_AMBULATORY_CARE_PROVIDER_SITE_OTHER): Payer: Medicare Other | Admitting: Internal Medicine

## 2011-06-14 ENCOUNTER — Ambulatory Visit (HOSPITAL_COMMUNITY): Payer: Medicare Other | Attending: Cardiology

## 2011-06-14 ENCOUNTER — Encounter: Payer: Self-pay | Admitting: Internal Medicine

## 2011-06-14 VITALS — BP 187/95 | HR 85 | Ht 63.0 in | Wt 116.0 lb

## 2011-06-14 DIAGNOSIS — R55 Syncope and collapse: Secondary | ICD-10-CM

## 2011-06-14 DIAGNOSIS — I6529 Occlusion and stenosis of unspecified carotid artery: Secondary | ICD-10-CM

## 2011-06-14 DIAGNOSIS — J4489 Other specified chronic obstructive pulmonary disease: Secondary | ICD-10-CM | POA: Insufficient documentation

## 2011-06-14 DIAGNOSIS — E785 Hyperlipidemia, unspecified: Secondary | ICD-10-CM | POA: Insufficient documentation

## 2011-06-14 DIAGNOSIS — I519 Heart disease, unspecified: Secondary | ICD-10-CM | POA: Insufficient documentation

## 2011-06-14 DIAGNOSIS — I10 Essential (primary) hypertension: Secondary | ICD-10-CM

## 2011-06-14 DIAGNOSIS — J449 Chronic obstructive pulmonary disease, unspecified: Secondary | ICD-10-CM | POA: Insufficient documentation

## 2011-06-14 DIAGNOSIS — I2510 Atherosclerotic heart disease of native coronary artery without angina pectoris: Secondary | ICD-10-CM

## 2011-06-14 DIAGNOSIS — F17200 Nicotine dependence, unspecified, uncomplicated: Secondary | ICD-10-CM | POA: Insufficient documentation

## 2011-06-14 MED ORDER — LISINOPRIL 5 MG PO TABS: 5.0000 mg | ORAL_TABLET | Freq: Every day | ORAL | Status: DC

## 2011-06-14 NOTE — Patient Instructions (Signed)
Your physician has recommended you make the following change in your medication:  1) Start lisinopril 5 mg one tablet by mouth once daily.  Your physician recommends that you return for lab work in: 2-3 weeks - bmp  Your physician has requested that you have a carotid duplex. This test is an ultrasound of the carotid arteries in your neck. It looks at blood flow through these arteries that supply the brain with blood. Allow one hour for this exam. There are no restrictions or special instructions.  Your physician has requested that you have a lexiscan myoview. For further information please visit [email or web address] Please follow instruction sheet, as given.  We will see you back on an as needed basis.

## 2011-06-14 NOTE — Progress Notes (Signed)
ELECTROPHYSIOLOGY CONSULT NOTE  Patient ID: [name] MRN: [id number], DOB/AGE: Mar 28, 1940   Date of Consult: 06/14/2011  Primary Physician: Oliver Barre, MD, MD Reason for Consultation: syncope  History of Present Illness Ms. Ferraz is a pleasant 71 year old woman with a PMH significant for non-obstructive CAD s/p LHC May 2009, normal LV function, HTN, dyslipidemia, PVD s/p right CEA and COPD who has been referred for EP evaluation of syncope. She is somewhat of a poor informant/historian. Ms. Dunnigan reports 2 episodes of syncope in the last 2 months. Her first episode occurred on Easter Sunday while she moving from bed to bathroom She denies any warning symptoms or prodrome. She denies chest pain, shortness of breath or palpitations. She denies dizziness or lightheadedness. She states, "All I remember is waking up on the floor and calling for my son." She thinks her LOC was brief. She denies loss of bowel or bladder function. She denies or significant injury. She odes describe flushing and daiphoresis with this spell She reports a second episode last Saturday which also occurred while she was standing in the kitchen. She again denies any prodrome or warning. Upon further questioning, she reports exertional dyspnea which is not new for her but seems to be worse in the last 6 months, most noticeable when walking her dog. She denies chest pain, palpitations or dizziness with exertion.    She does have hx of orthostatic  lightheadedness upon standing  Prior CEA without followup  Past Medical History  Diagnosis Date  . THYROID NODULE, RIGHT 02/01/2009  . GLUCOSE INTOLERANCE 11/04/2007  . HYPERLIPIDEMIA 02/12/2007  . HYPOKALEMIA 11/04/2007  . ANXIETY 02/12/2007  . DEPRESSION 02/12/2007  . HYPERTENSION 11/07/2006  . CORONARY ARTERY DISEASE 11/04/2007  . CAROTID ARTERY STENOSIS, BILATERAL 02/24/2007  . Unspecified Peripheral Vascular Disease 02/12/2007  . ASTHMATIC BRONCHITIS, ACUTE 04/22/2008  .  ALLERGIC RHINITIS 02/12/2007  . COPD 11/04/2007  . GERD 02/12/2007  . CONSTIPATION 02/12/2007  . DEGENERATIVE JOINT DISEASE, FINGERS 03/04/2008  . LOW BACK PAIN, CHRONIC 02/01/2009  . BACK PAIN, RIGHT 12/05/2009  . LEG PAIN, BILATERAL 02/01/2009  . OSTEOPOROSIS 02/12/2007  . DIZZINESS 02/12/2007  . INSOMNIA-SLEEP DISORDER-UNSPEC 08/04/2007  . FATIGUE 02/12/2007  . CHEST PAIN 02/12/2007  . Coronary artery disease 9/09    nonobstructive by cath  . Peripheral vascular disease 9/09    left carotid 60-80%   . Impaired glucose tolerance 05/11/2010  . Leg pain, bilateral 05/12/2010  . Syncope and collapse     Past Surgical History  Procedure Date  . S/p bowel obstruction   . Abdominal hysterectomy   . Appendectomy   . Oophorectomy   . Tonsillectomy   . S/p right cea 2/09  . S/p right thyroid nodule biopsy March 2011    negative  . Carotid endarterectomy   . Cardiac catheterization      Allergies/Intolerances Allergies  Allergen Reactions  . Tramadol     constipation    Home Medications . alendronate (FOSAMAX) 70 MG tablet Take 1 tablet (70 mg total) by mouth every 7 (seven) days. Take with a full glass of water on an empty stomach.  12 tablet  3  . aspirin 81 MG EC tablet Take 81 mg by mouth daily.        Marland Kitchen atenolol-chlorthalidone (TENORETIC) 50-25 MG per tablet Take 1 tablet by mouth daily.        . cholecalciferol (VITAMIN D) 1000 UNITS tablet Take 1,000 Units by mouth daily.        Marland Kitchen  citalopram (CELEXA) 10 MG tablet Take 1 tablet (10 mg total) by mouth daily.  90 tablet  3  . cloNIDine (CATAPRES) 0.2 MG tablet Take 1 tablet (0.2 mg total) by mouth 2 (two) times daily.  180 tablet  2  . simvastatin (ZOCOR) 40 MG tablet Take 1 tablet (40 mg total) by mouth daily.  90 tablet  2   Family History Family History  Problem Relation Age of Onset  . Cancer Son     colon cancer  . Heart attack Other   . Stroke Other   . Alcohol abuse Other   . Diabetes Other   . Hypertension Other       Social History Social History  . Marital Status: Widowed    Spouse Name: N/A    Number of Children: 2  . Years of Education: N/A   Occupational History  . 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 Smoking status: Current Everyday Smoker  . Smokeless tobacco: Not on file  . Alcohol Use: No  . Drug Use: No  . Sexually Active: Not on file   Review of Systems General:  No chills, fever, night sweats or weight changes.  Cardiovascular:  No chest pain, edema, orthopnea, palpitations, paroxysmal nocturnal dyspnea. Dermatological: No rash, lesions/masses Respiratory: No cough, dyspnea Urologic: No hematuria, dysuria Abdominal:   No nausea, vomiting, diarrhea, bright red blood per rectum, melena, or hematemesis Neurologic:  No visual changes, wkns, changes in mental status. All other systems reviewed and are otherwise negative except as noted above.  Physical Exam Blood pressure 187/95, pulse 85, height 5\' 3"  (1.6 m), weight 116 lb (52.617 kg).  General: Well developed, well appearing 71 year old female, in no acute distress. HEENT: Normocephalic, atraumatic. EOMs intact. Sclera nonicteric. Oropharynx clear.  Neck: Supple without bruits. No JVD. Lungs:  Respirations regular and unlabored, CTA bilaterally. No wheezes rales or rhonchi. Heart: RRR. S1, S2 present. II/VI systolic murmur at apex. No diastolic murmurs, rub, S3 or S4. Abdomen: Soft, non-tender, non-distended. BS present x 4 quadrants. No hepatosplenomegaly.  Extremities: No clubbing, cyanosis or edema. DP/PT/Radials 2+ and equal bilaterally. Skin warm and dry Psych: Normal affect. Neuro: Alert and oriented X 3. Moves all extremities spontaneously.but was impressed at the poor abilitiy to give a hsistoy and know the date s andreality of prior procedures    Labs/Radiology/Studies Mr Brain Wo Contrast  06/04/2011  *RADIOLOGY REPORT*  Clinical Data: Syncope 1 week ago.  MRI HEAD WITHOUT  CONTRAST  Technique:  Multiplanar, multiecho pulse sequences of the brain and surrounding structures were obtained according to standard protocol without intravenous contrast.  Comparison: 02/04/2003.  Findings: There is no evidence for acute infarction, intracranial hemorrhage, mass lesion, hydrocephalus, or extra-axial fluid.  Mild atrophy is present.  There is extensive chronic microvascular ischemic change in the periventricular and subcortical white matter. There is a tiny focus chronic hemorrhage in the right occipital lobe, suggesting hypertensive cerebrovascular disease.  The major intracranial vascular structures appear patent based on flow void signal.  The pituitary and craniocervical junction are unremarkable.  There is no acute sinus or mastoid disease.  IMPRESSION: Mild atrophy with moderate chronic microvascular ischemic change, likely hypertensive in origin.  No acute stroke or bleed.  Original Report Authenticated By: Elsie Stain, M.D.   Echocardiogram  Performed today; report pending  12-lead ECG performed today in office  Normal sinus rhythm with normal intervals  Assessment and Recommendations

## 2011-06-15 ENCOUNTER — Telehealth: Payer: Self-pay

## 2011-06-15 NOTE — Telephone Encounter (Signed)
Patient called, states she wants to reschedule carotid dopplers to a early morning appointment.Patient was told message sent to schedulers and they will call back to reschedule.

## 2011-06-15 NOTE — Assessment & Plan Note (Signed)
Suspect this is neurlaly mediated,  What i dont understand is the acute trigger This may be BP related and will need to control it better   The lack of prodrome makes avoidance a challenge but we have discussed the value of getting gup  Slowly something she has learned on her own

## 2011-06-15 NOTE — Assessment & Plan Note (Signed)
Needs reassessment oaf the previously documented significant stenosis not reevaluated in some years

## 2011-06-15 NOTE — Assessment & Plan Note (Signed)
Will undertake myoveiw given impainrment in exercise intolerance and known vascular disease

## 2011-06-15 NOTE — Assessment & Plan Note (Signed)
Very poorly controlled  Will begin ace-i  i am reluctant to increase her clonidine as it is known to aggravate OI which I suspect is themechanism of her syncope  Last Cr 0.8 and K normal so should tolerate  Will need f/u with  Dr Jonny Ruiz

## 2011-06-18 ENCOUNTER — Telehealth: Payer: Self-pay

## 2011-06-18 NOTE — Telephone Encounter (Signed)
Pt called c/o of leg and knee pain. Pt was previously on Tramadol but this caused constipation and then she was prescribed Celebrex but this was too expensive. Pt is requesting Rx for pain medication, please advise on this Dr Jonny Ruiz patient, thanks!

## 2011-06-19 NOTE — Telephone Encounter (Signed)
Pt did see the orthopedic physician.

## 2011-06-19 NOTE — Telephone Encounter (Signed)
i need to see that note

## 2011-06-19 NOTE — Telephone Encounter (Signed)
Pt was ref orthopedics.  Did she go?

## 2011-06-20 ENCOUNTER — Ambulatory Visit (HOSPITAL_COMMUNITY): Payer: Medicare Other | Attending: Cardiovascular Disease | Admitting: Radiology

## 2011-06-20 VITALS — BP 152/85 | Ht 63.0 in | Wt 116.0 lb

## 2011-06-20 DIAGNOSIS — I2510 Atherosclerotic heart disease of native coronary artery without angina pectoris: Secondary | ICD-10-CM | POA: Insufficient documentation

## 2011-06-20 DIAGNOSIS — R0602 Shortness of breath: Secondary | ICD-10-CM

## 2011-06-20 DIAGNOSIS — R55 Syncope and collapse: Secondary | ICD-10-CM | POA: Insufficient documentation

## 2011-06-20 MED ORDER — TECHNETIUM TC 99M TETROFOSMIN IV KIT
11.0000 | PACK | Freq: Once | INTRAVENOUS | Status: AC | PRN
  Administered 2011-06-20: 11 via INTRAVENOUS

## 2011-06-20 MED ORDER — TECHNETIUM TC 99M TETROFOSMIN IV KIT
33.0000 | PACK | Freq: Once | INTRAVENOUS | Status: AC | PRN
  Administered 2011-06-20: 33 via INTRAVENOUS

## 2011-06-20 MED ORDER — REGADENOSON 0.4 MG/5ML IV SOLN
0.4000 mg | Freq: Once | INTRAVENOUS | Status: AC
  Administered 2011-06-20: 0.4 mg via INTRAVENOUS

## 2011-06-20 NOTE — Telephone Encounter (Signed)
Pt informed of MD's advisement. 

## 2011-06-20 NOTE — Telephone Encounter (Signed)
Please tell ortho dr about the pain

## 2011-06-20 NOTE — Progress Notes (Signed)
[facility] SITE 3 NUCLEAR MED [address] [place] [address] [phone]  Cardiology Nuclear Med Study  [name] is a 71 y.o. female     MRN : [id number]     DOB: Jun 09, 1940  Procedure Date: 06/20/2011  Nuclear Med Background Indication for Stress Test:  Evaluation for Ischemia History:  2013 Echo EF 65-70%, 2009 MPS EF 69% minor inferior wall abnormality, 2009 Catheterization-  N/O CAD EF 65-70% Cardiac Risk Factors: Carotid Disease, Family History - CAD, Hypertension, Lipids, PVD and Smoker  Symptoms:  Diaphoresis, DOE, Light-Headedness, Rapid HR and Syncope   Nuclear Pre-Procedure Caffeine/Decaff Intake:  None NPO After: 7:00pm   Lungs:  clear O2 Sat: 98% on room air. IV 0.9% NS with Angio Cath:  20g  IV Site: R Antecubital  IV Started by:  Stanton Kidney, EMT-P  Chest Size (in):  32 Cup Size: A  Height: 5\' 3"  (1.6 m)  Weight:  116 lb (52.617 kg)  BMI:  Body mass index is 20.55 kg/(m^2). Tech Comments:  Meds were taken as directed.    Nuclear Med Study 1 or 2 day study: 1 day  Stress Test Type:  Lexiscan  Reading MD: Willa Rough, MD  Order Authorizing Provider:  Odessa Fleming, MD  Resting Radionuclide: Technetium 61m Tetrofosmin  Resting Radionuclide Dose: 10.5 mCi   Stress Radionuclide:  Technetium 58m Tetrofosmin  Stress Radionuclide Dose: 32.4 mCi           Stress Protocol Rest HR: 77 Stress HR: 110  Rest BP: 152/85 Stress BP: 213/82  Exercise Time (min): n/a METS: n/a   Predicted Max HR: 151 bpm % Max HR: 50.99 bpm Rate Pressure Product: 46962   Dose of Adenosine (mg):  n/a Dose of Lexiscan: 0.4 mg  Dose of Atropine (mg): n/a Dose of Dobutamine: n/a mcg/kg/min (at max HR)  Stress Test Technologist: Bonnita Levan, RN  Nuclear Technologist:  Domenic Polite, CNMT     Rest Procedure:  Myocardial perfusion imaging was performed at rest 45 minutes following the intravenous administration of Technetium 32m Tetrofosmin. Rest ECG NSR, No acute  changes  Stress Procedure:  The patient received IV Lexiscan 0.4 mg over 15-seconds.  Technetium 57m Tetrofosmin injected at 30-seconds.  There were no significant changes with Lexiscan.  Quantitative spect images were obtained after a 45 minute delay. Stress ECG: No significant change from baseline ECG  QPS Raw Data Images:  Normal; no motion artifact; normal heart/lung ratio. Stress Images:  Normal homogeneous uptake in all areas of the myocardium. Rest Images:  Normal homogeneous uptake in all areas of the myocardium. Subtraction (SDS):  No evidence of ischemia. Transient Ischemic Dilatation (Normal <1.22):  1.03 Lung/Heart Ratio (Normal <0.45):  0.25  Quantitative Gated Spect Images QGS EDV:  86 ml QGS ESV:  32 ml  Impression Exercise Capacity:  Lexiscan with no exercise. BP Response:  Hypertensive blood pressure response. Clinical Symptoms:  No significant symptoms noted. ECG Impression:  No significant ST segment change suggestive of ischemia. Comparison with Prior Nuclear Study: No images to compare  Overall Impression:  Normal stress nuclear study.  No evidence of ischemia.  Normal LV function. LV Ejection Fraction: 62%.  LV Wall Motion:  NL LV Function; NL Wall Motion.    Vesta Mixer, Montez Hageman., MD, [facility] 06/20/2011, 5:04 PM Office - [phone] Pager [phone]

## 2011-06-20 NOTE — Telephone Encounter (Signed)
[facility] for office notes, pt canceled appointment but has rescheduled.

## 2011-06-26 ENCOUNTER — Other Ambulatory Visit: Payer: Medicare Other

## 2011-06-28 ENCOUNTER — Other Ambulatory Visit: Payer: Self-pay | Admitting: Internal Medicine

## 2011-06-29 ENCOUNTER — Other Ambulatory Visit (INDEPENDENT_AMBULATORY_CARE_PROVIDER_SITE_OTHER): Payer: Medicare Other

## 2011-06-29 ENCOUNTER — Encounter (INDEPENDENT_AMBULATORY_CARE_PROVIDER_SITE_OTHER): Payer: Medicare Other

## 2011-06-29 DIAGNOSIS — I6529 Occlusion and stenosis of unspecified carotid artery: Secondary | ICD-10-CM

## 2011-06-29 DIAGNOSIS — I10 Essential (primary) hypertension: Secondary | ICD-10-CM

## 2011-06-29 LAB — BASIC METABOLIC PANEL
CO2: 29 mEq/L (ref 19–32)
Chloride: 104 mEq/L (ref 96–112)
Potassium: 3.7 mEq/L (ref 3.5–5.1)
Sodium: 141 mEq/L (ref 135–145)

## 2011-07-05 ENCOUNTER — Telehealth: Payer: Self-pay

## 2011-07-05 ENCOUNTER — Other Ambulatory Visit: Payer: Self-pay | Admitting: *Deleted

## 2011-07-05 DIAGNOSIS — I6529 Occlusion and stenosis of unspecified carotid artery: Secondary | ICD-10-CM

## 2011-07-05 MED ORDER — ACETAMINOPHEN-CODEINE 300-30 MG PO TABS: 1.0000 | ORAL_TABLET | Freq: Four times a day (QID) | ORAL | Status: AC | PRN

## 2011-07-05 NOTE — Telephone Encounter (Signed)
Faxed hardcopy to the patients pharmacy and informed prescription requested is faxed in.

## 2011-07-05 NOTE — Telephone Encounter (Signed)
Done hardcopy to robin- tylenol #3 

## 2011-07-05 NOTE — Telephone Encounter (Signed)
Pt called requesting medication to treat leg pain. Pt has not gone to Ortho yet because she afford copay. Pt also says that Celebrex was too expensive and Tramadol caused constipation. Please advise.

## 2011-07-13 ENCOUNTER — Telehealth: Payer: Self-pay | Admitting: Internal Medicine

## 2011-07-13 ENCOUNTER — Telehealth: Payer: Self-pay

## 2011-07-13 NOTE — Telephone Encounter (Signed)
Pt should call dr Graciela Husbands

## 2011-07-13 NOTE — Telephone Encounter (Signed)
Called and informed the patient MD's instructions.

## 2011-07-13 NOTE — Telephone Encounter (Signed)
Patient had recent test done at [facility], she has not gotten the results. Please advise

## 2011-07-13 NOTE — Telephone Encounter (Signed)
Please return call to patient at [phone] regarding the results of tests.

## 2011-07-13 NOTE — Telephone Encounter (Signed)
I am confused as the chart states she was informed of her blood test results June 6, and also her carotid tests about that time as well  Can she be more specific about what test?

## 2011-07-13 NOTE — Telephone Encounter (Signed)
Pt notified of results

## 2011-07-13 NOTE — Telephone Encounter (Signed)
Called the patient she informed she is aware of her blood test and carotid results, but she had a test ordered by Dr. Graciela Husbands and has not received the results.

## 2011-07-16 ENCOUNTER — Telehealth: Payer: Self-pay | Admitting: *Deleted

## 2011-07-16 NOTE — Telephone Encounter (Signed)
Caller is requesting order/request for shoulder mobilizer to be faxed back.

## 2011-07-17 NOTE — Telephone Encounter (Signed)
Will await fax.

## 2011-07-18 ENCOUNTER — Telehealth: Payer: Self-pay | Admitting: Internal Medicine

## 2011-07-18 NOTE — Telephone Encounter (Signed)
Forward to Dr. Oliver Barre for review on 07-18-11 ym

## 2011-11-01 ENCOUNTER — Other Ambulatory Visit: Payer: Self-pay | Admitting: Internal Medicine

## 2011-11-01 NOTE — Telephone Encounter (Signed)
Faxed hardcopy to pharmacy. 

## 2011-11-01 NOTE — Telephone Encounter (Signed)
Done hardcopy to robin  

## 2012-03-11 ENCOUNTER — Other Ambulatory Visit: Payer: Self-pay | Admitting: Internal Medicine

## 2012-05-05 ENCOUNTER — Other Ambulatory Visit: Payer: Self-pay | Admitting: Internal Medicine

## 2012-05-05 NOTE — Telephone Encounter (Signed)
Faxed hardcopy to pharmacy. 

## 2012-05-05 NOTE — Telephone Encounter (Signed)
Done hardcopy to robin  Due ROV 

## 2012-05-06 ENCOUNTER — Ambulatory Visit: Payer: Medicare Other | Admitting: Internal Medicine

## 2012-05-14 ENCOUNTER — Ambulatory Visit (INDEPENDENT_AMBULATORY_CARE_PROVIDER_SITE_OTHER): Payer: Medicare Other | Admitting: Internal Medicine

## 2012-05-14 ENCOUNTER — Encounter: Payer: Self-pay | Admitting: Internal Medicine

## 2012-05-14 VITALS — BP 172/100 | HR 91 | Temp 97.8°F | Ht 65.0 in | Wt 112.1 lb

## 2012-05-14 DIAGNOSIS — I10 Essential (primary) hypertension: Secondary | ICD-10-CM

## 2012-05-14 DIAGNOSIS — M6530 Trigger finger, unspecified finger: Secondary | ICD-10-CM

## 2012-05-14 DIAGNOSIS — E785 Hyperlipidemia, unspecified: Secondary | ICD-10-CM

## 2012-05-14 DIAGNOSIS — N3281 Overactive bladder: Secondary | ICD-10-CM

## 2012-05-14 DIAGNOSIS — N318 Other neuromuscular dysfunction of bladder: Secondary | ICD-10-CM

## 2012-05-14 DIAGNOSIS — J449 Chronic obstructive pulmonary disease, unspecified: Secondary | ICD-10-CM

## 2012-05-14 DIAGNOSIS — F17200 Nicotine dependence, unspecified, uncomplicated: Secondary | ICD-10-CM

## 2012-05-14 DIAGNOSIS — Z0000 Encounter for general adult medical examination without abnormal findings: Secondary | ICD-10-CM

## 2012-05-14 DIAGNOSIS — G4700 Insomnia, unspecified: Secondary | ICD-10-CM

## 2012-05-14 MED ORDER — OXYBUTYNIN CHLORIDE ER 10 MG PO TB24: 10.0000 mg | ORAL_TABLET | Freq: Every day | ORAL | Status: DC

## 2012-05-14 MED ORDER — LISINOPRIL 10 MG PO TABS: 10.0000 mg | ORAL_TABLET | Freq: Every day | ORAL | Status: DC

## 2012-05-14 MED ORDER — ZOLPIDEM TARTRATE 5 MG PO TABS: 5.0000 mg | ORAL_TABLET | Freq: Every evening | ORAL | Status: DC | PRN

## 2012-05-14 NOTE — Patient Instructions (Addendum)
Please increase the lisinopril to 10 mg per day Please take all new medication as prescribed - the ambien for sleep, and the generic oxybutinin for the bladder Please continue all other medications as before, and refills have been done if requested. Please have the pharmacy call with any other refills you may need. Please stop smoking You will be contacted regarding the referral for: colonoscopy Please continue your efforts at being more active, low cholesterol diet, and weight control. You are otherwise up to date with prevention measures today. Please go to the LAB in the Basement (turn left off the elevator) for the tests to be done today You will be contacted by phone if any changes need to be made immediately.  Otherwise, you will receive a letter about your results with an explanation, but please check with MyChart first. You will be contacted regarding the referral for: Hand Surgeon for the right trigger fingers Please return in 6 months, or sooner if needed

## 2012-05-14 NOTE — Assessment & Plan Note (Signed)
To increase ACE to 10 mg qd,  to f/u any worsening symptoms or concerns

## 2012-05-14 NOTE — Assessment & Plan Note (Signed)
Ok for [employer] prn

## 2012-05-14 NOTE — Assessment & Plan Note (Addendum)
For tylenol prn pain, hold nsaid due to renal, refer hand surgury  Note:  Total time for pt hx, exam, review of record with pt in the room, determination of diagnoses and plan for further eval and tx is > 40 min, with over 50% spent in coordination and counseling of patient

## 2012-05-14 NOTE — Progress Notes (Signed)
Subjective:    Patient ID: [name], female    DOB: 05-08-1940, 72 y.o.   MRN: [id number]  HPI  Here to f/u; overall doing ok,  Pt denies chest pain, increased sob or doe, wheezing, orthopnea, PND, increased LE swelling, palpitations, dizziness or syncope.  Pt denies polydipsia, polyuria, or low sugar symptoms such as weakness or confusion improved with po intake.  Pt denies new neurological symptoms such as new headache, or facial or extremity weakness or numbness.   Pt states overall good compliance with meds, has been trying to follow lower cholesterol diet, with wt overall stable, still smokes and stays very thin. Tends to work with hands daily, is right handed and c/o palmar pain/swelling/tender assoc with 3rd and 2nd finger catching and now unable to completely make a fist.     Bp at home in the 140/s - stays mild elevated.  Also with 6-12 mo daily urinary urge and frequency without hematuria, pain, asks for OAB type tx, delcines urology.  Denies worsening depressive symptoms, suicidal ideation, or panic; has ongoing anxiety, and more trouble getting to sleep recently, asks for tx. Also due for colonoscopy Past Medical History  Diagnosis Date  . THYROID NODULE, RIGHT 02/01/2009  . GLUCOSE INTOLERANCE 11/04/2007  . HYPERLIPIDEMIA 02/12/2007  . HYPOKALEMIA 11/04/2007  . ANXIETY 02/12/2007  . DEPRESSION 02/12/2007  . HYPERTENSION 11/07/2006  . CORONARY ARTERY DISEASE 11/04/2007  . CAROTID ARTERY STENOSIS, BILATERAL 02/24/2007  . Unspecified Peripheral Vascular Disease 02/12/2007  . ASTHMATIC BRONCHITIS, ACUTE 04/22/2008  . ALLERGIC RHINITIS 02/12/2007  . COPD 11/04/2007  . GERD 02/12/2007  . CONSTIPATION 02/12/2007  . DEGENERATIVE JOINT DISEASE, FINGERS 03/04/2008  . LOW BACK PAIN, CHRONIC 02/01/2009  . BACK PAIN, RIGHT 12/05/2009  . LEG PAIN, BILATERAL 02/01/2009  . OSTEOPOROSIS 02/12/2007  . DIZZINESS 02/12/2007  . INSOMNIA-SLEEP DISORDER-UNSPEC 08/04/2007  . FATIGUE 02/12/2007  . CHEST PAIN 02/12/2007   . Coronary artery disease 9/09    nonobstructive by cath  . Peripheral vascular disease 9/09    left carotid 60-80%   . Impaired glucose tolerance 05/11/2010  . Leg pain, bilateral 05/12/2010  . Syncope and collapse    Past Surgical History  Procedure Laterality Date  . S/p bowel obstruction    . Abdominal hysterectomy    . Appendectomy    . Oophorectomy    . Tonsillectomy    . S/p right cea  2/09  . S/p right thyroid nodule biopsy  March 2011    negative  . Carotid endarterectomy    . Cardiac catheterization      reports that she has been smoking.  She does not have any smokeless tobacco history on file. She reports that she does not drink alcohol or use illicit drugs. family history includes Alcohol abuse in her other; Cancer in her son; Diabetes in her other; Heart attack in her other; Hypertension in her other; and Stroke in her other. Allergies  Allergen Reactions  . Tramadol     constipation   Current Outpatient Prescriptions on File Prior to Visit  Medication Sig Dispense Refill  . acetaminophen-codeine (TYLENOL #3) 300-30 MG per tablet TAKE 1 TABLET BY MOUTH EVERY 6 HOURS AS NEEDED FOR PAIN  30 tablet  0  . alendronate (FOSAMAX) 70 MG tablet TAKE 1 TABLET BY MOUTH EVERY 7 DAYS. TAKE ON AN EMPTY STOMACH WITH FULL GLASS OF WATER  12 tablet  3  . aspirin 81 MG EC tablet Take 81 mg by mouth daily.        Marland Kitchen  cholecalciferol (VITAMIN D) 1000 UNITS tablet Take 1,000 Units by mouth daily.        . simvastatin (ZOCOR) 40 MG tablet TAKE 1 TABLET (40 MG TOTAL) BY MOUTH DAILY.  90 tablet  2  . atenolol-chlorthalidone (TENORETIC) 50-25 MG per tablet Take 1 tablet by mouth daily.        . citalopram (CELEXA) 10 MG tablet Take 1 tablet (10 mg total) by mouth daily.  90 tablet  3  . cloNIDine (CATAPRES) 0.2 MG tablet Take 1 tablet (0.2 mg total) by mouth 2 (two) times daily.  180 tablet  2   No current facility-administered medications on file prior to visit.    Review of Systems   Constitutional: Negative for unexpected weight change, or unusual diaphoresis  HENT: Negative for tinnitus.   Eyes: Negative for photophobia and visual disturbance.  Respiratory: Negative for choking and stridor.   Gastrointestinal: Negative for vomiting and blood in stool.  Genitourinary: Negative for hematuria and decreased urine volume.  Musculoskeletal: Negative for acute joint swelling Skin: Negative for color change and wound.  Neurological: Negative for tremors and numbness other than noted  Psychiatric/Behavioral: Negative for decreased concentration or  hyperactivity.       Objective:   Physical Exam BP 172/100  Pulse 91  Temp(Src) 97.8 F (36.6 C) (Oral)  Ht 5\' 5"  (1.651 m)  Wt 112 lb 2 oz (50.86 kg)  BMI 18.66 kg/m2  SpO2 98% VS noted,  Constitutional: Pt appears well-developed and well-nourished.  HENT: Head: NCAT.  Right Ear: External ear normal.  Left Ear: External ear normal.  Eyes: Conjunctivae and EOM are normal. Pupils are equal, round, and reactive to light.  Neck: Normal range of motion. Neck supple.  Cardiovascular: Normal rate and regular rhythm.   Pulmonary/Chest: Effort normal and breath sounds normal.  Abd:  Soft, NT, non-distended, + BS Neurological: Pt is alert. Not confused, motor5/5 includiing grip Right hand with decreased ROM to flexion of PIP, DIP of 2nd and 3rd fingers with difficulty extending as well, and diffuse nondiscrete sweling/tender to right palm mostly near the MCP's (but no MCP swelling to the volar)  Skin: Skin is warm. No erythema.  Psychiatric: Pt behavior is normal. Thought content normal. Not depressed affect , 2+ nervous     Assessment & Plan:

## 2012-05-14 NOTE — Assessment & Plan Note (Signed)
Now down to 1/3 ppd, urged to quit

## 2012-05-14 NOTE — Assessment & Plan Note (Signed)
stable overall by history and exam, recent data reviewed with pt, and pt to continue medical treatment as before,  to f/u any worsening symptoms or concerns Lab Results  Component Value Date   LDLCALC 77 05/11/2011   For f/u labs today

## 2012-05-14 NOTE — Assessment & Plan Note (Signed)
Presumed per hx, for UA today, for trial oxybutinin 10 ER qd, declines urology for now

## 2012-05-14 NOTE — Assessment & Plan Note (Signed)
stable overall by history and exam, recent data reviewed with pt, and pt to continue medical treatment as before,  to f/u any worsening symptoms or concerns SpO2 Readings from Last 3 Encounters:  05/14/12 98%  05/11/11 97%  11/10/10 98%

## 2012-05-16 ENCOUNTER — Other Ambulatory Visit: Payer: Self-pay | Admitting: Internal Medicine

## 2012-05-16 NOTE — Telephone Encounter (Signed)
Done hardcopy to robin  

## 2012-05-16 NOTE — Telephone Encounter (Signed)
Faxed hardcopy to [facility] [address] [place]

## 2012-05-22 ENCOUNTER — Telehealth: Payer: Self-pay

## 2012-05-22 NOTE — Telephone Encounter (Signed)
Pt calls and states she received a call from a [employer] #, 2 days ago. I advised pt the phone call was to inform her she needs to speak to the billing department before an appt is made regarding her hand.

## 2012-06-09 ENCOUNTER — Encounter (INDEPENDENT_AMBULATORY_CARE_PROVIDER_SITE_OTHER): Payer: Self-pay | Admitting: Ophthalmology

## 2012-06-13 ENCOUNTER — Encounter (INDEPENDENT_AMBULATORY_CARE_PROVIDER_SITE_OTHER): Payer: Medicare Other | Admitting: Ophthalmology

## 2012-06-13 DIAGNOSIS — H43819 Vitreous degeneration, unspecified eye: Secondary | ICD-10-CM

## 2012-06-13 DIAGNOSIS — I10 Essential (primary) hypertension: Secondary | ICD-10-CM

## 2012-06-13 DIAGNOSIS — H35039 Hypertensive retinopathy, unspecified eye: Secondary | ICD-10-CM

## 2012-06-13 DIAGNOSIS — H34239 Retinal artery branch occlusion, unspecified eye: Secondary | ICD-10-CM

## 2012-06-13 DIAGNOSIS — H2510 Age-related nuclear cataract, unspecified eye: Secondary | ICD-10-CM

## 2012-06-16 ENCOUNTER — Ambulatory Visit: Payer: Medicare Other | Admitting: Internal Medicine

## 2012-06-17 ENCOUNTER — Ambulatory Visit (INDEPENDENT_AMBULATORY_CARE_PROVIDER_SITE_OTHER): Payer: Medicare Other | Admitting: Internal Medicine

## 2012-06-17 ENCOUNTER — Encounter: Payer: Self-pay | Admitting: Internal Medicine

## 2012-06-17 VITALS — BP 172/82 | HR 71 | Temp 98.4°F | Ht 66.0 in | Wt 116.0 lb

## 2012-06-17 DIAGNOSIS — R7302 Impaired glucose tolerance (oral): Secondary | ICD-10-CM

## 2012-06-17 DIAGNOSIS — Z0000 Encounter for general adult medical examination without abnormal findings: Secondary | ICD-10-CM

## 2012-06-17 DIAGNOSIS — F329 Major depressive disorder, single episode, unspecified: Secondary | ICD-10-CM

## 2012-06-17 DIAGNOSIS — E785 Hyperlipidemia, unspecified: Secondary | ICD-10-CM

## 2012-06-17 DIAGNOSIS — I10 Essential (primary) hypertension: Secondary | ICD-10-CM

## 2012-06-17 DIAGNOSIS — R7309 Other abnormal glucose: Secondary | ICD-10-CM

## 2012-06-17 MED ORDER — CLONIDINE HCL 0.2 MG PO TABS: 0.2000 mg | ORAL_TABLET | Freq: Two times a day (BID) | ORAL | Status: DC

## 2012-06-17 MED ORDER — ATENOLOL-CHLORTHALIDONE 50-25 MG PO TABS: 1.0000 | ORAL_TABLET | Freq: Every day | ORAL | Status: DC

## 2012-06-17 MED ORDER — CITALOPRAM HYDROBROMIDE 10 MG PO TABS: 10.0000 mg | ORAL_TABLET | Freq: Every day | ORAL | Status: DC

## 2012-06-17 MED ORDER — OXYBUTYNIN CHLORIDE ER 10 MG PO TB24: 10.0000 mg | ORAL_TABLET | Freq: Every day | ORAL | Status: DC

## 2012-06-17 MED ORDER — ZOLPIDEM TARTRATE 5 MG PO TABS: 5.0000 mg | ORAL_TABLET | Freq: Every evening | ORAL | Status: DC | PRN

## 2012-06-17 MED ORDER — ACETAMINOPHEN-CODEINE #3 300-30 MG PO TABS: ORAL_TABLET | ORAL | Status: DC

## 2012-06-17 MED ORDER — SIMVASTATIN 40 MG PO TABS: ORAL_TABLET | ORAL | Status: DC

## 2012-06-17 MED ORDER — ALENDRONATE SODIUM 70 MG PO TABS: ORAL_TABLET | ORAL | Status: DC

## 2012-06-17 MED ORDER — LISINOPRIL 10 MG PO TABS: 10.0000 mg | ORAL_TABLET | Freq: Every day | ORAL | Status: DC

## 2012-06-17 NOTE — Assessment & Plan Note (Signed)
Not charged, but due for colonosocopy - will order

## 2012-06-17 NOTE — Assessment & Plan Note (Signed)
stable overall by history and exam, recent data reviewed with pt, and pt to continue medical treatment as before,  to f/u any worsening symptoms or concerns, for labs today Lab Results  Component Value Date   LDLCALC 77 05/11/2011

## 2012-06-17 NOTE — Assessment & Plan Note (Signed)
To start celexa 10 qd, declines counseling

## 2012-06-17 NOTE — Assessment & Plan Note (Signed)
Asympt, for a1c,  to f/u any worsening symptoms or concerns  

## 2012-06-17 NOTE — Patient Instructions (Addendum)
Please take all new medication as prescribed - the generic celexa 10 mg for depression (once per day) Please continue all other medications as before, and ALL refills have been done today Please take all of your Blood Pressure medications Please continue your efforts at being more active, low cholesterol diet, and weight control. You will be contacted regarding the referral for: colonoscopy You are otherwise up to date with prevention measures today. Please return in 1 month, or sooner if needed

## 2012-06-17 NOTE — Assessment & Plan Note (Signed)
Uncontrolled, to restart  meds 

## 2012-06-17 NOTE — Progress Notes (Signed)
Subjective:    Patient ID: [name], female    DOB: [birth year], 72 y.o.   MRN: [id number]  HPI  Here to f/u; overall doing ok,  Pt denies chest pain, increased sob or doe, wheezing, orthopnea, PND, increased LE swelling, palpitations, dizziness or syncope.  Pt denies polydipsia, polyuria, or low sugar symptoms such as weakness or confusion improved with po intake.  Pt denies new neurological symptoms such as new headache, or facial or extremity weakness or numbness.   Pt states overall good compliance with meds, has been trying to follow lower cholesterol, diabetic diet, with wt overall stable,  but little exercise however .  BP elevated at optho with sbp 200, and due for cataracts surgury soon. Is currently only taking the lisnopril 10 mg, not taking the clonidine or tenoretic, seems confused today about having these on her med list.  Has had mild worsening depressive symptoms, but no suicidal ideation or HI, or panic; has ongoing anxiety. Now willing to have her first colonoscopy though Past Medical History  Diagnosis Date  . THYROID NODULE, RIGHT 02/01/2009  . GLUCOSE INTOLERANCE 11/04/2007  . HYPERLIPIDEMIA 02/12/2007  . HYPOKALEMIA 11/04/2007  . ANXIETY 02/12/2007  . DEPRESSION 02/12/2007  . HYPERTENSION 11/07/2006  . CORONARY ARTERY DISEASE 11/04/2007  . CAROTID ARTERY STENOSIS, BILATERAL 02/24/2007  . Unspecified Peripheral Vascular Disease 02/12/2007  . ASTHMATIC BRONCHITIS, ACUTE 04/22/2008  . ALLERGIC RHINITIS 02/12/2007  . COPD 11/04/2007  . GERD 02/12/2007  . CONSTIPATION 02/12/2007  . DEGENERATIVE JOINT DISEASE, FINGERS 03/04/2008  . LOW BACK PAIN, CHRONIC 02/01/2009  . BACK PAIN, RIGHT 12/05/2009  . LEG PAIN, BILATERAL 02/01/2009  . OSTEOPOROSIS 02/12/2007  . DIZZINESS 02/12/2007  . INSOMNIA-SLEEP DISORDER-UNSPEC 08/04/2007  . FATIGUE 02/12/2007  . CHEST PAIN 02/12/2007  . Coronary artery disease 9/09    nonobstructive by cath  . Peripheral vascular disease 9/09    left carotid 60-80%   .  Impaired glucose tolerance 05/11/2010  . Leg pain, bilateral 05/12/2010  . Syncope and collapse    Past Surgical History  Procedure Laterality Date  . S/p bowel obstruction    . Abdominal hysterectomy    . Appendectomy    . Oophorectomy    . Tonsillectomy    . S/p right cea  2/09  . S/p right thyroid nodule biopsy  March 2011    negative  . Carotid endarterectomy    . Cardiac catheterization      reports that she has been smoking.  She does not have any smokeless tobacco history on file. She reports that she does not drink alcohol or use illicit drugs. family history includes Alcohol abuse in her other; Cancer in her son; Diabetes in her other; Heart attack in her other; Hypertension in her other; and Stroke in her other. Allergies  Allergen Reactions  . Tramadol     constipation   Current Outpatient Prescriptions on File Prior to Visit  Medication Sig Dispense Refill  . acetaminophen-codeine (TYLENOL #3) 300-30 MG per tablet TAKE 1 TABLET BY MOUTH EVERY 6 HOURS AS NEEDED FOR PAIN  60 tablet  1  . alendronate (FOSAMAX) 70 MG tablet TAKE 1 TABLET BY MOUTH EVERY 7 DAYS. TAKE ON AN EMPTY STOMACH WITH FULL GLASS OF WATER  12 tablet  3  . aspirin 81 MG EC tablet Take 81 mg by mouth daily.        Marland Kitchen atenolol-chlorthalidone (TENORETIC) 50-25 MG per tablet Take 1 tablet by mouth daily.        Marland Kitchen  cholecalciferol (VITAMIN D) 1000 UNITS tablet Take 1,000 Units by mouth daily.        . cloNIDine (CATAPRES) 0.2 MG tablet Take 1 tablet (0.2 mg total) by mouth 2 (two) times daily.  180 tablet  2  . lisinopril (PRINIVIL,ZESTRIL) 10 MG tablet Take 1 tablet (10 mg total) by mouth daily.  90 tablet  3  . oxybutynin (DITROPAN-XL) 10 MG 24 hr tablet Take 1 tablet (10 mg total) by mouth daily.  90 tablet  3  . simvastatin (ZOCOR) 40 MG tablet TAKE 1 TABLET (40 MG TOTAL) BY MOUTH DAILY.  90 tablet  2  . zolpidem (AMBIEN) 5 MG tablet Take 1 tablet (5 mg total) by mouth at bedtime as needed for sleep.  30  tablet  5   No current facility-administered medications on file prior to visit.   Review of Systems  Constitutional: Negative for unexpected weight change, or unusual diaphoresis  HENT: Negative for tinnitus.   Eyes: Negative for photophobia and visual disturbance.  Respiratory: Negative for choking and stridor.   Gastrointestinal: Negative for vomiting and blood in stool.  Genitourinary: Negative for hematuria and decreased urine volume.  Musculoskeletal: Negative for acute joint swelling Skin: Negative for color change and wound.  Neurological: Negative for tremors and numbness other than noted  Psychiatric/Behavioral: Negative for decreased concentration or  hyperactivity.       Objective:   Physical Exam BP 172/82  Pulse 71  Temp(Src) 98.4 F (36.9 C) (Oral)  Ht 5\' 6"  (1.676 m)  Wt 116 lb (52.617 kg)  BMI 18.73 kg/m2  SpO2 97% VS noted,  Constitutional: Pt appears well-developed and well-nourished.  HENT: Head: NCAT.  Right Ear: External ear normal.  Left Ear: External ear normal.  Eyes: Conjunctivae and EOM are normal. Pupils are equal, round, and reactive to light.  Neck: Normal range of motion. Neck supple.  Cardiovascular: Normal rate and regular rhythm.   Pulmonary/Chest: Effort normal and breath sounds normal.  Abd:  Soft, NT, non-distended, + BS Neurological: Pt is alert. Not confused  Skin: Skin is warm. No erythema.  Psychiatric: Pt behavior is normal. Thought content normal. 1+ depressed affect , mild nerovus    Assessment & Plan:

## 2012-07-09 ENCOUNTER — Ambulatory Visit (INDEPENDENT_AMBULATORY_CARE_PROVIDER_SITE_OTHER): Payer: Medicare Other | Admitting: Internal Medicine

## 2012-07-09 ENCOUNTER — Encounter: Payer: Self-pay | Admitting: Internal Medicine

## 2012-07-09 ENCOUNTER — Encounter (INDEPENDENT_AMBULATORY_CARE_PROVIDER_SITE_OTHER): Payer: Medicare Other | Admitting: Ophthalmology

## 2012-07-09 ENCOUNTER — Other Ambulatory Visit (INDEPENDENT_AMBULATORY_CARE_PROVIDER_SITE_OTHER): Payer: Medicare Other

## 2012-07-09 VITALS — BP 130/72 | HR 63 | Temp 98.6°F | Ht 64.0 in | Wt 98.4 lb

## 2012-07-09 DIAGNOSIS — H35039 Hypertensive retinopathy, unspecified eye: Secondary | ICD-10-CM

## 2012-07-09 DIAGNOSIS — R7302 Impaired glucose tolerance (oral): Secondary | ICD-10-CM

## 2012-07-09 DIAGNOSIS — I10 Essential (primary) hypertension: Secondary | ICD-10-CM

## 2012-07-09 DIAGNOSIS — R7309 Other abnormal glucose: Secondary | ICD-10-CM

## 2012-07-09 DIAGNOSIS — E785 Hyperlipidemia, unspecified: Secondary | ICD-10-CM

## 2012-07-09 DIAGNOSIS — Z0000 Encounter for general adult medical examination without abnormal findings: Secondary | ICD-10-CM

## 2012-07-09 DIAGNOSIS — H348392 Tributary (branch) retinal vein occlusion, unspecified eye, stable: Secondary | ICD-10-CM

## 2012-07-09 DIAGNOSIS — J449 Chronic obstructive pulmonary disease, unspecified: Secondary | ICD-10-CM

## 2012-07-09 DIAGNOSIS — H43819 Vitreous degeneration, unspecified eye: Secondary | ICD-10-CM

## 2012-07-09 DIAGNOSIS — M6530 Trigger finger, unspecified finger: Secondary | ICD-10-CM

## 2012-07-09 DIAGNOSIS — F17200 Nicotine dependence, unspecified, uncomplicated: Secondary | ICD-10-CM

## 2012-07-09 LAB — URINALYSIS, ROUTINE W REFLEX MICROSCOPIC
Bilirubin Urine: NEGATIVE
Ketones, ur: NEGATIVE
Leukocytes, UA: NEGATIVE
pH: 5.5 (ref 5.0–8.0)

## 2012-07-09 LAB — BASIC METABOLIC PANEL
Chloride: 103 mEq/L (ref 96–112)
GFR: 42.42 mL/min — ABNORMAL LOW (ref >60.00)
Potassium: 3.9 mEq/L (ref 3.5–5.1)
Sodium: 141 mEq/L (ref 135–145)

## 2012-07-09 LAB — CBC WITH DIFFERENTIAL/PLATELET
Eosinophils Absolute: 0.1 10*3/uL (ref 0.0–0.7)
Eosinophils Relative: 1.9 % (ref 0.0–5.0)
HCT: 42.2 % (ref 36.0–46.0)
Lymphs Abs: 1.5 10*3/uL (ref 0.7–4.0)
MCHC: 34 g/dL (ref 30.0–36.0)
MCV: 87.6 fl (ref 78.0–100.0)
Monocytes Absolute: 0.7 10*3/uL (ref 0.1–1.0)
Platelets: 193 10*3/uL (ref 150.0–400.0)
RDW: 15.7 % — ABNORMAL HIGH (ref 11.5–14.6)

## 2012-07-09 LAB — HEPATIC FUNCTION PANEL
ALT: 18 U/L (ref 0–35)
AST: 21 U/L (ref 0–37)
Alkaline Phosphatase: 46 U/L (ref 39–117)
Bilirubin, Direct: 0.1 mg/dL (ref 0.0–0.3)
Total Bilirubin: 0.5 mg/dL (ref 0.3–1.2)

## 2012-07-09 LAB — LIPID PANEL
HDL: 75.9 mg/dL (ref >39.00)
Total CHOL/HDL Ratio: 3
VLDL: 33.8 mg/dL (ref 0.0–40.0)

## 2012-07-09 LAB — TSH: TSH: 0.2 u[IU]/mL — ABNORMAL LOW (ref 0.35–5.50)

## 2012-07-09 MED ORDER — LISINOPRIL 20 MG PO TABS: 20.0000 mg | ORAL_TABLET | Freq: Every day | ORAL | Status: DC

## 2012-07-09 NOTE — Assessment & Plan Note (Addendum)
stable overall by exam, but with recent severe elev BP will ask to incresaed the lisiopril to 20 mg qd, recent data reviewed with pt, and pt to continue other medical treatment as before,  to f/u any worsening symptoms or concerns BP Readings from Last 3 Encounters:  07/09/12 130/72  06/17/12 172/82  05/14/12 172/100   Note:  Total time for pt hx, exam, review of record with pt in the room, determination of diagnoses and plan for further eval and tx is > 40 min, with over 50% spent in coordination and counseling of patient

## 2012-07-09 NOTE — Progress Notes (Addendum)
Subjective:    Patient ID: [name], female    DOB: 05/23/1940, 72 y.o.   MRN: [id number]  HPI Here to f/u with daughter; very nervous today, overall doing ok,  Pt denies chest pain, increased sob or doe, wheezing, orthopnea, PND, increased LE swelling, palpitations, dizziness or syncope.  Pt denies polydipsia, polyuria, or low sugar symptoms such as weakness or confusion improved with po intake.  Pt denies new neurological symptoms such as new headache, or facial or extremity weakness or numbness.   Pt states overall good compliance with meds, has been trying to follow lower cholesterol diet, with wt overall stable,  but little exercise however. Still smoking, more stress recently, very hard to quit.  Is hesitant, but accepts referral for colonoscopy.  Had some scheduling issue recently, now willing ot re-schedule with hand surgeon as well, though improved still has 4th finger right hand diffuse pain, swelling and decreased ROM to PIP.   Pt denies fever, wt loss, night sweats, loss of appetite, or other constitutional symptoms.  Pt denies polydipsia, polyuria,   Past Medical History  Diagnosis Date  . THYROID NODULE, RIGHT 02/01/2009  . GLUCOSE INTOLERANCE 11/04/2007  . HYPERLIPIDEMIA 02/12/2007  . HYPOKALEMIA 11/04/2007  . ANXIETY 02/12/2007  . DEPRESSION 02/12/2007  . HYPERTENSION 11/07/2006  . CORONARY ARTERY DISEASE 11/04/2007  . CAROTID ARTERY STENOSIS, BILATERAL 02/24/2007  . Unspecified Peripheral Vascular Disease 02/12/2007  . ASTHMATIC BRONCHITIS, ACUTE 04/22/2008  . ALLERGIC RHINITIS 02/12/2007  . COPD 11/04/2007  . GERD 02/12/2007  . CONSTIPATION 02/12/2007  . DEGENERATIVE JOINT DISEASE, FINGERS 03/04/2008  . LOW BACK PAIN, CHRONIC 02/01/2009  . BACK PAIN, RIGHT 12/05/2009  . LEG PAIN, BILATERAL 02/01/2009  . OSTEOPOROSIS 02/12/2007  . DIZZINESS 02/12/2007  . INSOMNIA-SLEEP DISORDER-UNSPEC 08/04/2007  . FATIGUE 02/12/2007  . CHEST PAIN 02/12/2007  . Coronary artery disease 9/09    nonobstructive  by cath  . Peripheral vascular disease 9/09    left carotid 60-80%   . Impaired glucose tolerance 05/11/2010  . Leg pain, bilateral 05/12/2010  . Syncope and collapse   . Essential hypertension, benign 06/17/2012   Past Surgical History  Procedure Laterality Date  . S/p bowel obstruction    . Abdominal hysterectomy    . Appendectomy    . Oophorectomy    . Tonsillectomy    . S/p right cea  2/09  . S/p right thyroid nodule biopsy  March 2011    negative  . Carotid endarterectomy    . Cardiac catheterization      reports that she has been smoking.  She does not have any smokeless tobacco history on file. She reports that she does not drink alcohol or use illicit drugs. family history includes Alcohol abuse in her other; Cancer in her son; Diabetes in her other; Heart attack in her other; Hypertension in her other; and Stroke in her other. Allergies  Allergen Reactions  . Tramadol     constipation   Current Outpatient Prescriptions on File Prior to Visit  Medication Sig Dispense Refill  . acetaminophen-codeine (TYLENOL #3) 300-30 MG per tablet TAKE 1 TABLET BY MOUTH EVERY 6 HOURS AS NEEDED FOR PAIN  60 tablet  1  . alendronate (FOSAMAX) 70 MG tablet TAKE 1 TABLET BY MOUTH EVERY 7 DAYS. TAKE ON AN EMPTY STOMACH WITH FULL GLASS OF WATER  12 tablet  3  . aspirin 81 MG EC tablet Take 81 mg by mouth daily.        Marland Kitchen atenolol-chlorthalidone (  TENORETIC) 50-25 MG per tablet Take 1 tablet by mouth daily.  90 tablet  3  . cholecalciferol (VITAMIN D) 1000 UNITS tablet Take 1,000 Units by mouth daily.        . citalopram (CELEXA) 10 MG tablet Take 1 tablet (10 mg total) by mouth daily.  90 tablet  3  . cloNIDine (CATAPRES) 0.2 MG tablet Take 1 tablet (0.2 mg total) by mouth 2 (two) times daily.  180 tablet  3  . oxybutynin (DITROPAN-XL) 10 MG 24 hr tablet Take 1 tablet (10 mg total) by mouth daily.  90 tablet  3  . simvastatin (ZOCOR) 40 MG tablet TAKE 1 TABLET (40 MG TOTAL) BY MOUTH DAILY.  90  tablet  3  . zolpidem (AMBIEN) 5 MG tablet Take 1 tablet (5 mg total) by mouth at bedtime as needed for sleep.  30 tablet  5   No current facility-administered medications on file prior to visit.   Review of Systems  Constitutional: Negative for unexpected weight change, or unusual diaphoresis  HENT: Negative for tinnitus.   Eyes: Negative for photophobia and visual disturbance.  Respiratory: Negative for choking and stridor.   Gastrointestinal: Negative for vomiting and blood in stool.  Genitourinary: Negative for hematuria and decreased urine volume.  Musculoskeletal: Negative for acute joint swelling Skin: Negative for color change and wound.  Neurological: Negative for tremors and numbness other than noted  Psychiatric/Behavioral: Negative for decreased concentration or  hyperactivity.       Objective:   Physical Exam BP 130/72  Pulse 63  Temp(Src) 98.6 F (37 C) (Oral)  Ht 5\' 4"  (1.626 m)  Wt 98 lb 6 oz (44.623 kg)  BMI 16.88 kg/m2  SpO2 95% VS noted,  Constitutional: Pt appears thin.  HENT: Head: NCAT.  Right Ear: External ear normal.  Left Ear: External ear normal.  Eyes: Conjunctivae and EOM are normal. Pupils are equal, round, and reactive to light.  Neck: Normal range of motion. Neck supple.  Cardiovascular: Normal rate and regular rhythm.   Pulmonary/Chest: Effort normal and breath sounds decreased bilat , no rales or wheezing  Abd:  Soft, NT, non-distended, + BS Neurological: Pt is alert. Not confused  Skin: Skin is warm. No erythema. No rash, No LE edema Psychiatric: Pt behavior is normal. Thought content normal. 2+ nervous    Assessment & Plan:  Quality Measures addressed:  Mammogram:  pt declines and will self-refer

## 2012-07-09 NOTE — Assessment & Plan Note (Signed)
stable overall by history and exam, recent data reviewed with pt, and pt to continue medical treatment as before,  to f/u any worsening symptoms or concerns Lab Results  Component Value Date   LDLCALC 77 05/11/2011

## 2012-07-09 NOTE — Patient Instructions (Addendum)
OK to increase the lisinopril to 20 mg per day (this was sent to the pharmacy) Please continue all other medications as before Please have the pharmacy call with any other refills you may need. You will be contacted regarding the referral for: colonoscopy, and Dr Amanda Pea for the finger Please go to the LAB in the Basement (turn left off the elevator) for the tests to be done today You will be contacted by phone if any changes need to be made immediately.  Otherwise, you will receive a letter about your results with an explanation  Please remember to sign up for My Chart if you have not done so, as this will be important to you in the future with finding out test results, communicating by private email, and scheduling acute appointments online when needed.  Please stop smoking  Please return in 6 months, or sooner if needed

## 2012-07-09 NOTE — Assessment & Plan Note (Signed)
Ok for hand surgury referral 

## 2012-07-09 NOTE — Assessment & Plan Note (Signed)
stable overall by history and exam, recent data reviewed with pt, and pt to continue medical treatment as before,  to f/u any worsening symptoms or concerns Lab Results  Component Value Date   HGBA1C 5.7 05/11/2011

## 2012-07-09 NOTE — Assessment & Plan Note (Signed)
stable overall by history and exam, recent data reviewed with pt, and pt to continue medical treatment as before,  to f/u any worsening symptoms or concerns SpO2 Readings from Last 3 Encounters:  07/09/12 95%  06/17/12 97%  05/14/12 98%

## 2012-07-09 NOTE — Assessment & Plan Note (Signed)
Urged to quit 

## 2012-07-09 NOTE — Assessment & Plan Note (Signed)
Not charged but due for colonoscpy

## 2012-07-17 ENCOUNTER — Ambulatory Visit: Payer: Medicare Other | Admitting: Internal Medicine

## 2012-07-24 ENCOUNTER — Encounter (HOSPITAL_COMMUNITY): Payer: Self-pay | Admitting: Emergency Medicine

## 2012-07-24 ENCOUNTER — Inpatient Hospital Stay (HOSPITAL_COMMUNITY)
Admission: EM | Admit: 2012-07-24 | Discharge: 2012-07-27 | DRG: 387 | Disposition: A | Discharge status: Home / Self Care | Payer: Medicare Other | Attending: Family Medicine | Admitting: Family Medicine

## 2012-07-24 ENCOUNTER — Emergency Department (HOSPITAL_COMMUNITY): Payer: Medicare Other

## 2012-07-24 DIAGNOSIS — F329 Major depressive disorder, single episode, unspecified: Secondary | ICD-10-CM

## 2012-07-24 DIAGNOSIS — K219 Gastro-esophageal reflux disease without esophagitis: Secondary | ICD-10-CM

## 2012-07-24 DIAGNOSIS — Z0000 Encounter for general adult medical examination without abnormal findings: Secondary | ICD-10-CM

## 2012-07-24 DIAGNOSIS — R109 Unspecified abdominal pain: Secondary | ICD-10-CM

## 2012-07-24 DIAGNOSIS — M6530 Trigger finger, unspecified finger: Secondary | ICD-10-CM

## 2012-07-24 DIAGNOSIS — F17200 Nicotine dependence, unspecified, uncomplicated: Secondary | ICD-10-CM

## 2012-07-24 DIAGNOSIS — F411 Generalized anxiety disorder: Secondary | ICD-10-CM

## 2012-07-24 DIAGNOSIS — M1710 Unilateral primary osteoarthritis, unspecified knee: Secondary | ICD-10-CM

## 2012-07-24 DIAGNOSIS — I10 Essential (primary) hypertension: Secondary | ICD-10-CM

## 2012-07-24 DIAGNOSIS — K5900 Constipation, unspecified: Secondary | ICD-10-CM

## 2012-07-24 DIAGNOSIS — E041 Nontoxic single thyroid nodule: Secondary | ICD-10-CM

## 2012-07-24 DIAGNOSIS — R7302 Impaired glucose tolerance (oral): Secondary | ICD-10-CM

## 2012-07-24 DIAGNOSIS — N289 Disorder of kidney and ureter, unspecified: Secondary | ICD-10-CM | POA: Diagnosis present

## 2012-07-24 DIAGNOSIS — M19049 Primary osteoarthritis, unspecified hand: Secondary | ICD-10-CM

## 2012-07-24 DIAGNOSIS — I658 Occlusion and stenosis of other precerebral arteries: Secondary | ICD-10-CM | POA: Diagnosis present

## 2012-07-24 DIAGNOSIS — M179 Osteoarthritis of knee, unspecified: Secondary | ICD-10-CM

## 2012-07-24 DIAGNOSIS — M810 Age-related osteoporosis without current pathological fracture: Secondary | ICD-10-CM

## 2012-07-24 DIAGNOSIS — G4700 Insomnia, unspecified: Secondary | ICD-10-CM

## 2012-07-24 DIAGNOSIS — J309 Allergic rhinitis, unspecified: Secondary | ICD-10-CM

## 2012-07-24 DIAGNOSIS — J449 Chronic obstructive pulmonary disease, unspecified: Secondary | ICD-10-CM

## 2012-07-24 DIAGNOSIS — F3289 Other specified depressive episodes: Secondary | ICD-10-CM

## 2012-07-24 DIAGNOSIS — R5381 Other malaise: Secondary | ICD-10-CM

## 2012-07-24 DIAGNOSIS — I6529 Occlusion and stenosis of unspecified carotid artery: Secondary | ICD-10-CM | POA: Diagnosis present

## 2012-07-24 DIAGNOSIS — J4489 Other specified chronic obstructive pulmonary disease: Secondary | ICD-10-CM

## 2012-07-24 DIAGNOSIS — E785 Hyperlipidemia, unspecified: Secondary | ICD-10-CM

## 2012-07-24 DIAGNOSIS — E876 Hypokalemia: Secondary | ICD-10-CM

## 2012-07-24 DIAGNOSIS — N3281 Overactive bladder: Secondary | ICD-10-CM

## 2012-07-24 DIAGNOSIS — M79604 Pain in right leg: Secondary | ICD-10-CM

## 2012-07-24 DIAGNOSIS — D72829 Elevated white blood cell count, unspecified: Secondary | ICD-10-CM

## 2012-07-24 DIAGNOSIS — I2510 Atherosclerotic heart disease of native coronary artery without angina pectoris: Secondary | ICD-10-CM

## 2012-07-24 DIAGNOSIS — K5090 Crohn's disease, unspecified, without complications: Principal | ICD-10-CM | POA: Diagnosis present

## 2012-07-24 DIAGNOSIS — M545 Low back pain, unspecified: Secondary | ICD-10-CM

## 2012-07-24 DIAGNOSIS — K559 Vascular disorder of intestine, unspecified: Secondary | ICD-10-CM

## 2012-07-24 DIAGNOSIS — R197 Diarrhea, unspecified: Secondary | ICD-10-CM

## 2012-07-24 DIAGNOSIS — R55 Syncope and collapse: Secondary | ICD-10-CM

## 2012-07-24 LAB — URINALYSIS, ROUTINE W REFLEX MICROSCOPIC
Bilirubin Urine: NEGATIVE
Glucose, UA: 100 mg/dL — AB
Ketones, ur: NEGATIVE mg/dL
Leukocytes, UA: NEGATIVE
Nitrite: NEGATIVE
Protein, ur: 100 mg/dL — AB
Specific Gravity, Urine: 1.02 (ref 1.005–1.030)
Urobilinogen, UA: 0.2 mg/dL (ref 0.0–1.0)
pH: 5 (ref 5.0–8.0)

## 2012-07-24 LAB — CBC WITH DIFFERENTIAL/PLATELET
Basophils Absolute: 0 10*3/uL (ref 0.0–0.1)
Basophils Relative: 0 % (ref 0–1)
Eosinophils Absolute: 0 10*3/uL (ref 0.0–0.7)
Eosinophils Relative: 0 % (ref 0–5)
HCT: 48.6 % — ABNORMAL HIGH (ref 36.0–46.0)
Hemoglobin: 17 g/dL — ABNORMAL HIGH (ref 12.0–15.0)
Lymphocytes Relative: 7 % — ABNORMAL LOW (ref 12–46)
Lymphs Abs: 1.5 10*3/uL (ref 0.7–4.0)
MCH: 29.7 pg (ref 26.0–34.0)
MCHC: 35 g/dL (ref 30.0–36.0)
MCV: 85 fL (ref 78.0–100.0)
Monocytes Absolute: 1.4 10*3/uL — ABNORMAL HIGH (ref 0.1–1.0)
Monocytes Relative: 6 % (ref 3–12)
Neutro Abs: 18.7 10*3/uL — ABNORMAL HIGH (ref 1.7–7.7)
Neutrophils Relative %: 87 % — ABNORMAL HIGH (ref 43–77)
Platelets: 219 10*3/uL (ref 150–400)
RBC: 5.72 MIL/uL — ABNORMAL HIGH (ref 3.87–5.11)
RDW: 14.7 % (ref 11.5–15.5)
WBC: 21.5 10*3/uL — ABNORMAL HIGH (ref 4.0–10.5)

## 2012-07-24 LAB — COMPREHENSIVE METABOLIC PANEL
ALT: 34 U/L (ref 0–35)
AST: 40 U/L — ABNORMAL HIGH (ref 0–37)
Albumin: 4.4 g/dL (ref 3.5–5.2)
Alkaline Phosphatase: 112 U/L (ref 39–117)
BUN: 33 mg/dL — ABNORMAL HIGH (ref 6–23)
CO2: 24 mEq/L (ref 19–32)
Calcium: 10.5 mg/dL (ref 8.4–10.5)
Chloride: 94 mEq/L — ABNORMAL LOW (ref 96–112)
Creatinine, Ser: 1.63 mg/dL — ABNORMAL HIGH (ref 0.50–1.10)
GFR calc Af Amer: 36 mL/min — ABNORMAL LOW (ref >90)
GFR calc non Af Amer: 31 mL/min — ABNORMAL LOW (ref >90)
Glucose, Bld: 194 mg/dL — ABNORMAL HIGH (ref 70–99)
Potassium: 4 mEq/L (ref 3.5–5.1)
Sodium: 141 mEq/L (ref 135–145)
Total Bilirubin: 0.7 mg/dL (ref 0.3–1.2)
Total Protein: 8.6 g/dL — ABNORMAL HIGH (ref 6.0–8.3)

## 2012-07-24 LAB — URINE MICROSCOPIC-ADD ON

## 2012-07-24 MED ORDER — ONDANSETRON HCL 4 MG PO TABS: 4.0000 mg | ORAL_TABLET | Freq: Four times a day (QID) | ORAL | Status: DC | PRN

## 2012-07-24 MED ORDER — INSULIN ASPART 100 UNIT/ML ~~LOC~~ SOLN
0.0000 [IU] | SUBCUTANEOUS | Status: DC
  Administered 2012-07-25 – 2012-07-26 (×2): 1 [IU] via SUBCUTANEOUS

## 2012-07-24 MED ORDER — OXYBUTYNIN CHLORIDE ER 10 MG PO TB24
10.0000 mg | ORAL_TABLET | Freq: Every day | ORAL | Status: DC
  Administered 2012-07-25 – 2012-07-27 (×3): 10 mg via ORAL
  Filled 2012-07-24 (×3): qty 1

## 2012-07-24 MED ORDER — SIMVASTATIN 40 MG PO TABS
40.0000 mg | ORAL_TABLET | Freq: Every day | ORAL | Status: DC
  Administered 2012-07-25 – 2012-07-26 (×2): 40 mg via ORAL
  Filled 2012-07-24 (×3): qty 1

## 2012-07-24 MED ORDER — DEXTROSE-NACL 5-0.9 % IV SOLN
INTRAVENOUS | Status: DC
  Administered 2012-07-25 (×2): via INTRAVENOUS

## 2012-07-24 MED ORDER — IOHEXOL 300 MG/ML  SOLN
50.0000 mL | Freq: Once | INTRAMUSCULAR | Status: AC | PRN
  Administered 2012-07-24: 50 mL via ORAL

## 2012-07-24 MED ORDER — SODIUM CHLORIDE 0.9 % IJ SOLN
3.0000 mL | Freq: Two times a day (BID) | INTRAMUSCULAR | Status: DC
  Administered 2012-07-25 – 2012-07-27 (×3): 3 mL via INTRAVENOUS

## 2012-07-24 MED ORDER — MORPHINE SULFATE 2 MG/ML IJ SOLN
1.0000 mg | INTRAMUSCULAR | Status: DC | PRN
  Administered 2012-07-26: 1 mg via INTRAVENOUS
  Filled 2012-07-24: qty 1

## 2012-07-24 MED ORDER — MORPHINE SULFATE 4 MG/ML IJ SOLN
6.0000 mg | Freq: Once | INTRAMUSCULAR | Status: AC
  Administered 2012-07-24: 6 mg via INTRAVENOUS
  Filled 2012-07-24: qty 2

## 2012-07-24 MED ORDER — CIPROFLOXACIN IN D5W 400 MG/200ML IV SOLN
400.0000 mg | Freq: Two times a day (BID) | INTRAVENOUS | Status: DC
  Administered 2012-07-25: 400 mg via INTRAVENOUS
  Filled 2012-07-24 (×2): qty 200

## 2012-07-24 MED ORDER — SODIUM CHLORIDE 0.9 % IV BOLUS (SEPSIS)
1000.0000 mL | Freq: Once | INTRAVENOUS | Status: AC
  Administered 2012-07-24: 1000 mL via INTRAVENOUS

## 2012-07-24 MED ORDER — CLONIDINE HCL 0.2 MG PO TABS
0.2000 mg | ORAL_TABLET | Freq: Two times a day (BID) | ORAL | Status: DC
  Administered 2012-07-25 (×2): 0.2 mg via ORAL
  Filled 2012-07-24 (×3): qty 1

## 2012-07-24 MED ORDER — ONDANSETRON HCL 4 MG/2ML IJ SOLN
4.0000 mg | Freq: Once | INTRAMUSCULAR | Status: AC
  Administered 2012-07-24: 4 mg via INTRAVENOUS
  Filled 2012-07-24: qty 2

## 2012-07-24 MED ORDER — ACETAMINOPHEN-CODEINE #3 300-30 MG PO TABS
1.0000 | ORAL_TABLET | ORAL | Status: DC | PRN
  Administered 2012-07-27: 1 via ORAL
  Filled 2012-07-24: qty 1

## 2012-07-24 MED ORDER — ONDANSETRON HCL 4 MG/2ML IJ SOLN: 4.0000 mg | Freq: Four times a day (QID) | INTRAMUSCULAR | Status: DC | PRN

## 2012-07-24 NOTE — ED Notes (Signed)
Per EMs. Pt from home.  Originally called out to home for anxiety attack, ems calmed pt down, then pt told ems she has had rectal bleeding since yesterday.

## 2012-07-24 NOTE — ED Notes (Signed)
Floor to return call for report 

## 2012-07-24 NOTE — ED Notes (Signed)
[id number]<AV> Expected date:<BR> Expected time:<BR> Means of arrival:Ambulance<BR> Comments:<BR> 89yF/rectal bleeding

## 2012-07-24 NOTE — ED Notes (Signed)
[place] [place] # [phone] call if needed

## 2012-07-24 NOTE — ED Provider Notes (Signed)
History    CSN: [id number] Arrival date & time 07/24/12  1839  First MD Initiated Contact with Patient 07/24/12 1841     Chief Complaint  Patient presents with  . Rectal Bleeding   (Consider location/radiation/quality/duration/timing/severity/associated sxs/prior Treatment) HPI patient presents to the emergency department with abdominal pain, and rectal bleeding.  Patient, states, that these symptoms started last night.  She states that today the symptoms got worse.  Patient denies chest pain, shortness of breath, fever, weakness, headache, blurred vision, dizziness, syncope.  Patient, states she didn't take any medications prior to arrival.  She states that nothing seems to make her condition, better.  States, that the bleeding was lighter colored blood other than dark patient, states she's never had GI bleeding.  Patient, states she had a previous, hysterectomy and small bowel obstruction.  Patient, states, abdominal pain, is worse with palpation and movement. Past Medical History  Diagnosis Date  . THYROID NODULE, RIGHT 02/01/2009  . GLUCOSE INTOLERANCE 11/04/2007  . HYPERLIPIDEMIA 02/12/2007  . HYPOKALEMIA 11/04/2007  . ANXIETY 02/12/2007  . DEPRESSION 02/12/2007  . HYPERTENSION 11/07/2006  . CORONARY ARTERY DISEASE 11/04/2007  . CAROTID ARTERY STENOSIS, BILATERAL 02/24/2007  . Unspecified Peripheral Vascular Disease 02/12/2007  . ASTHMATIC BRONCHITIS, ACUTE 04/22/2008  . ALLERGIC RHINITIS 02/12/2007  . COPD 11/04/2007  . GERD 02/12/2007  . CONSTIPATION 02/12/2007  . DEGENERATIVE JOINT DISEASE, FINGERS 03/04/2008  . LOW BACK PAIN, CHRONIC 02/01/2009  . BACK PAIN, RIGHT 12/05/2009  . LEG PAIN, BILATERAL 02/01/2009  . OSTEOPOROSIS 02/12/2007  . DIZZINESS 02/12/2007  . INSOMNIA-SLEEP DISORDER-UNSPEC 08/04/2007  . FATIGUE 02/12/2007  . CHEST PAIN 02/12/2007  . Coronary artery disease 9/09    nonobstructive by cath  . Peripheral vascular disease 9/09    left carotid 60-80%   . Impaired glucose  tolerance 05/11/2010  . Leg pain, bilateral 05/12/2010  . Syncope and collapse   . Essential hypertension, benign 06/17/2012   Past Surgical History  Procedure Laterality Date  . S/p bowel obstruction    . Abdominal hysterectomy    . Appendectomy    . Oophorectomy    . Tonsillectomy    . S/p right cea  2/09  . S/p right thyroid nodule biopsy  March 2011    negative  . Carotid endarterectomy    . Cardiac catheterization     Family History  Problem Relation Age of Onset  . Cancer Son     colon cancer  . Heart attack Other   . Stroke Other   . Alcohol abuse Other   . Diabetes Other   . Hypertension Other    History  Substance Use Topics  . Smoking status: Current Every Day Smoker  . Smokeless tobacco: Not on file  . Alcohol Use: No   OB History   Grav Para Term Preterm Abortions TAB SAB Ect Mult Living                 Review of Systems All other systems negative except as documented in the HPI. All pertinent positives and negatives as reviewed in the HPI. Allergies  Tramadol  Home Medications   Current Outpatient Rx  Name  Route  Sig  Dispense  Refill  . acetaminophen-codeine (TYLENOL #3) 300-30 MG per tablet   Oral   Take 1 tablet by mouth every 4 (four) hours as needed for pain.         Marland Kitchen alendronate (FOSAMAX) 70 MG tablet   Oral   Take 70 mg  by mouth every 7 (seven) days. Take with a full glass of water on an empty stomach.         Marland Kitchen aspirin 81 MG EC tablet   Oral   Take 81 mg by mouth daily.           Marland Kitchen atenolol-chlorthalidone (TENORETIC) 50-25 MG per tablet   Oral   Take 1 tablet by mouth daily.   90 tablet   3   . cholecalciferol (VITAMIN D) 1000 UNITS tablet   Oral   Take 1,000 Units by mouth daily.          . cloNIDine (CATAPRES) 0.2 MG tablet   Oral   Take 1 tablet (0.2 mg total) by mouth 2 (two) times daily.   180 tablet   3   . lisinopril (PRINIVIL,ZESTRIL) 20 MG tablet   Oral   Take 1 tablet (20 mg total) by mouth daily.    90 tablet   3   . simvastatin (ZOCOR) 40 MG tablet      TAKE 1 TABLET (40 MG TOTAL) BY MOUTH DAILY.   90 tablet   3   . oxybutynin (DITROPAN-XL) 10 MG 24 hr tablet   Oral   Take 1 tablet (10 mg total) by mouth daily.   90 tablet   3   . zolpidem (AMBIEN) 5 MG tablet   Oral   Take 1 tablet (5 mg total) by mouth at bedtime as needed for sleep.   30 tablet   5    BP 210/100  Pulse 83  Temp(Src) 98.3 F (36.8 C) (Oral)  Resp 25  SpO2 92% Physical Exam  Nursing note and vitals reviewed. Constitutional: She appears well-developed and well-nourished. She appears distressed.  HENT:  Head: Normocephalic and atraumatic.  Mouth/Throat: Oropharynx is clear and moist.  Eyes: Pupils are equal, round, and reactive to light.  Neck: Normal range of motion. Neck supple.  Cardiovascular: Normal rate, regular rhythm and normal heart sounds.  Exam reveals no gallop and no friction rub.   No murmur heard. Pulmonary/Chest: Effort normal and breath sounds normal.  Abdominal: Soft. Bowel sounds are normal. She exhibits no distension and no mass. There is tenderness. There is guarding. There is no rebound.      ED Course  Procedures (including critical care time) Labs Reviewed  CBC WITH DIFFERENTIAL - Abnormal; Notable for the following:    WBC 21.5 (*)    RBC 5.72 (*)    Hemoglobin 17.0 (*)    HCT 48.6 (*)    Neutrophils Relative % 87 (*)    Neutro Abs 18.7 (*)    Lymphocytes Relative 7 (*)    Monocytes Absolute 1.4 (*)    All other components within normal limits  COMPREHENSIVE METABOLIC PANEL - Abnormal; Notable for the following:    Chloride 94 (*)    Glucose, Bld 194 (*)    BUN 33 (*)    Creatinine, Ser 1.63 (*)    Total Protein 8.6 (*)    AST 40 (*)    GFR calc non Af Amer 31 (*)    GFR calc Af Amer 36 (*)    All other components within normal limits  URINALYSIS, ROUTINE W REFLEX MICROSCOPIC - Abnormal; Notable for the following:    APPearance CLOUDY (*)    Glucose,  UA 100 (*)    Hgb urine dipstick MODERATE (*)    Protein, ur 100 (*)    All other components within normal limits  CG4 I-STAT (LACTIC ACID) - Abnormal; Notable for the following:    Lactic Acid, Venous 5.33 (*)    All other components within normal limits  URINE MICROSCOPIC-ADD ON   Ct Abdomen Pelvis Wo Contrast  07/24/2012   *RADIOLOGY REPORT*  Clinical Data: Lower abdominal pain.  Rectal bleeding.  CT ABDOMEN AND PELVIS WITHOUT CONTRAST  Technique:  Multidetector CT imaging of the abdomen and pelvis was performed following the standard protocol without intravenous contrast.  Comparison: 03/26/2009  Findings: Linear scarring in the lung bases.  No effusions.  Heart is normal size.  Liver, spleen, gallbladder, pancreas, adrenals and kidneys have an unremarkable unenhanced appearance.  Tiny nonobstructing stone in the lower pole of the right kidney.  There appears to be circumferential wall thickening involving the descending colon.  Small amount of fluid in the left paracolic gutter.  Findings compatible with colitis.  Small bowel is decompressed.  No free air or adenopathy.  Aorta and iliac vessels are heavily calcified.  IMPRESSION: Descending colonic wall thickening with small amount adjacent fluid.  Findings compatible with colitis, favor infectious although ischemic cannot be excluded.   Original Report Authenticated By: Charlett Nose, M.D.   1. Ischemic colitis   2. Abdominal pain   3. Bloody diarrhea    Patient will be admitted to the, [facility].  I also spoke with Dr. Ailene Ravel from GI who agreed to come and see the patient in consultation.  Patient has been fairly stable here in the emergency department MDM  MDM Reviewed: nursing note and vitals Interpretation: labs and CT scan Consults: admitting MD and gastrointestinal      Carlyle Dolly, PA-C 07/25/12 0132

## 2012-07-24 NOTE — ED Notes (Signed)
Pt states lower abd pain started today.  Pt states blood is bright red.  Pt states she has been throwing up.

## 2012-07-25 ENCOUNTER — Encounter (HOSPITAL_COMMUNITY): Payer: Self-pay | Admitting: Nurse Practitioner

## 2012-07-25 DIAGNOSIS — I10 Essential (primary) hypertension: Secondary | ICD-10-CM

## 2012-07-25 DIAGNOSIS — E876 Hypokalemia: Secondary | ICD-10-CM

## 2012-07-25 DIAGNOSIS — R7309 Other abnormal glucose: Secondary | ICD-10-CM

## 2012-07-25 LAB — GLUCOSE, CAPILLARY
Glucose-Capillary: 106 mg/dL — ABNORMAL HIGH (ref 70–99)
Glucose-Capillary: 114 mg/dL — ABNORMAL HIGH (ref 70–99)
Glucose-Capillary: 120 mg/dL — ABNORMAL HIGH (ref 70–99)
Glucose-Capillary: 91 mg/dL (ref 70–99)

## 2012-07-25 LAB — BASIC METABOLIC PANEL
BUN: 29 mg/dL — ABNORMAL HIGH (ref 6–23)
CO2: 29 mEq/L (ref 19–32)
Chloride: 104 mEq/L (ref 96–112)
GFR calc non Af Amer: 41 mL/min — ABNORMAL LOW (ref >90)
Glucose, Bld: 114 mg/dL — ABNORMAL HIGH (ref 70–99)
Potassium: 3 mEq/L — ABNORMAL LOW (ref 3.5–5.1)
Sodium: 140 mEq/L (ref 135–145)

## 2012-07-25 MED ORDER — CIPROFLOXACIN IN D5W 400 MG/200ML IV SOLN
400.0000 mg | INTRAVENOUS | Status: DC
  Administered 2012-07-26 – 2012-07-27 (×2): 400 mg via INTRAVENOUS
  Filled 2012-07-25 (×2): qty 200

## 2012-07-25 MED ORDER — HYDRALAZINE HCL 20 MG/ML IJ SOLN
5.0000 mg | Freq: Four times a day (QID) | INTRAMUSCULAR | Status: DC | PRN
  Administered 2012-07-26 – 2012-07-27 (×3): 5 mg via INTRAVENOUS
  Filled 2012-07-25 (×3): qty 1

## 2012-07-25 MED ORDER — POTASSIUM CHLORIDE CRYS ER 20 MEQ PO TBCR
40.0000 meq | EXTENDED_RELEASE_TABLET | Freq: Once | ORAL | Status: AC
Start: 2012-07-25 — End: 2012-07-25
  Administered 2012-07-25: 40 meq via ORAL
  Filled 2012-07-25: qty 2

## 2012-07-25 NOTE — H&P (Signed)
[facility] History and Physical  EMREE LOCICERO ZOX:[id number] DOB: 1940/09/24    PCP:   Oliver Barre, MD   Chief Complaint: bloody diarrhea  HPI: [name] is an 72 y.o. female with hx of hyperlipidemia, coratid stenosis, CAD, PVD, presents to the ER with one day hx of abdominal cramps and bloody diarrhea.  Her last conoloscopy was over 10 years ago.  She denied any fever or chills, no nausea or vomiting, no chest pain or shortness of breath and no lightheadedness.  There is no ill contact or distant travel and no recent use of antibiotics.  She has no similar episode nor having dx of IBD.  Evalatuation in the ER included a Hb of 17 G/DL, a leukocytosis with WBC of 21K, normal bicarb, but lactic acid of 5.3.  Her BG was 194 gm per DL, and her Cr is slgihtly elevated at 1.63.  An abdominal Pelvic CT showed colonic wall inflammation, no abcess, C/w colitis possilbe infectious vs ischemic.  Hospitalist was asked to admit her for bloody diarrhea.  Rewiew of Systems:  Constitutional: Negative for malaise, fever and chills. No significant weight loss or weight gain Eyes: Negative for eye pain, redness and discharge, diplopia, visual changes, or flashes of light. ENMT: Negative for ear pain, hoarseness, nasal congestion, sinus pressure and sore throat. No headaches; tinnitus, drooling, or problem swallowing. Cardiovascular: Negative for chest pain, palpitations, diaphoresis, dyspnea and peripheral edema. ; No orthopnea, PND Respiratory: Negative for cough, hemoptysis, wheezing and stridor. No pleuritic chestpain. Gastrointestinal: Negative for nausea, vomiting, diarrhea, constipation, abdominal pain, melena,  hematemesis, jaundice  Genitourinary: Negative for frequency, dysuria, incontinence,flank pain and hematuria; Musculoskeletal: Negative for back pain and neck pain. Negative for swelling and trauma.;  Skin: . Negative for pruritus, rash, abrasions, bruising and skin lesion.;  ulcerations Neuro: Negative for headache, lightheadedness and neck stiffness. Negative for weakness, altered level of consciousness , altered mental status, extremity weakness, burning feet, involuntary movement, seizure and syncope.  Psych: negative for anxiety, depression, insomnia, tearfulness, panic attacks, hallucinations, paranoia, suicidal or homicidal ideation    Past Medical History  Diagnosis Date  . THYROID NODULE, RIGHT 02/01/2009  . GLUCOSE INTOLERANCE 11/04/2007  . HYPERLIPIDEMIA 02/12/2007  . HYPOKALEMIA 11/04/2007  . ANXIETY 02/12/2007  . DEPRESSION 02/12/2007  . HYPERTENSION 11/07/2006  . CORONARY ARTERY DISEASE 11/04/2007  . CAROTID ARTERY STENOSIS, BILATERAL 02/24/2007  . Unspecified Peripheral Vascular Disease 02/12/2007  . ASTHMATIC BRONCHITIS, ACUTE 04/22/2008  . ALLERGIC RHINITIS 02/12/2007  . COPD 11/04/2007  . GERD 02/12/2007  . CONSTIPATION 02/12/2007  . DEGENERATIVE JOINT DISEASE, FINGERS 03/04/2008  . LOW BACK PAIN, CHRONIC 02/01/2009  . BACK PAIN, RIGHT 12/05/2009  . LEG PAIN, BILATERAL 02/01/2009  . OSTEOPOROSIS 02/12/2007  . DIZZINESS 02/12/2007  . INSOMNIA-SLEEP DISORDER-UNSPEC 08/04/2007  . FATIGUE 02/12/2007  . CHEST PAIN 02/12/2007  . Coronary artery disease 9/09    nonobstructive by cath  . Peripheral vascular disease 9/09    left carotid 60-80%   . Impaired glucose tolerance 05/11/2010  . Leg pain, bilateral 05/12/2010  . Syncope and collapse   . Essential hypertension, benign 06/17/2012    Past Surgical History  Procedure Laterality Date  . S/p bowel obstruction    . Abdominal hysterectomy    . Appendectomy    . Oophorectomy    . Tonsillectomy    . S/p right cea  2/09  . S/p right thyroid nodule biopsy  March 2011    negative  . Carotid endarterectomy    .  Cardiac catheterization      Medications:  HOME MEDS: Prior to Admission medications   Medication Sig Start Date End Date Taking? Authorizing Provider  acetaminophen-codeine (TYLENOL #3) 300-30 MG  per tablet Take 1 tablet by mouth every 4 (four) hours as needed for pain.   Yes Historical Provider, MD  alendronate (FOSAMAX) 70 MG tablet Take 70 mg by mouth every 7 (seven) days. Take with a full glass of water on an empty stomach.   Yes Historical Provider, MD  aspirin 81 MG EC tablet Take 81 mg by mouth daily.     Yes Historical Provider, MD  atenolol-chlorthalidone (TENORETIC) 50-25 MG per tablet Take 1 tablet by mouth daily. 06/17/12  Yes Corwin Levins, MD  cholecalciferol (VITAMIN D) 1000 UNITS tablet Take 1,000 Units by mouth daily.    Yes Historical Provider, MD  cloNIDine (CATAPRES) 0.2 MG tablet Take 1 tablet (0.2 mg total) by mouth 2 (two) times daily. 06/17/12  Yes Corwin Levins, MD  lisinopril (PRINIVIL,ZESTRIL) 20 MG tablet Take 1 tablet (20 mg total) by mouth daily. 07/09/12  Yes Corwin Levins, MD  simvastatin (ZOCOR) 40 MG tablet TAKE 1 TABLET (40 MG TOTAL) BY MOUTH DAILY. 06/17/12  Yes Corwin Levins, MD  oxybutynin (DITROPAN-XL) 10 MG 24 hr tablet Take 1 tablet (10 mg total) by mouth daily. 06/17/12   Corwin Levins, MD  zolpidem (AMBIEN) 5 MG tablet Take 1 tablet (5 mg total) by mouth at bedtime as needed for sleep. 06/17/12   Corwin Levins, MD     Allergies:  Allergies  Allergen Reactions  . Tramadol     constipation    Social History:   reports that she has been smoking.  She does not have any smokeless tobacco history on file. She reports that she does not drink alcohol or use illicit drugs.  Family History: Family History  Problem Relation Age of Onset  . Cancer Son     colon cancer  . Heart attack Other   . Stroke Other   . Alcohol abuse Other   . Diabetes Other   . Hypertension Other      Physical Exam: Filed Vitals:   07/24/12 1844 07/24/12 1845 07/24/12 2356  BP: 210/100 210/100 170/91  Pulse: 75 83 78  Temp:  98.3 F (36.8 C)   TempSrc:  Oral   Resp: 21 25 18   SpO2: 99% 92% 96%   Blood pressure 170/91, pulse 78, temperature 98.3 F (36.8 C),  temperature source Oral, resp. rate 18, SpO2 96.00%.  GEN:  Pleasant patient lying in the stretcher in no acute distress; cooperative with exam. PSYCH:  alert and oriented x4; does not appear anxious or depressed; affect is appropriate. HEENT: Mucous membranes pink and anicteric; PERRLA; EOM intact; no cervical lymphadenopathy nor thyromegaly or carotid bruit; no JVD; There were no stridor. Neck is very supple. Breasts:: Not examined CHEST WALL: No tenderness CHEST: Normal respiration, clear to auscultation bilaterally.  HEART: Regular rate and rhythm.  There are no murmur, rub, or gallops.   BACK: No kyphosis or scoliosis; no CVA tenderness ABDOMEN: soft and non-tender; no masses, no organomegaly, normal abdominal bowel sounds; no pannus; no intertriginous candida. There is no rebound and no distention. Rectal Exam: Not done EXTREMITIES: No bone or joint deformity; age-appropriate arthropathy of the hands and knees; no edema; no ulcerations.  There is no calf tenderness. Genitalia: not examined PULSES: 2+ and symmetric SKIN: Normal hydration no rash or ulceration CNS:  Cranial nerves 2-12 grossly intact no focal lateralizing neurologic deficit.  Speech is fluent; uvula elevated with phonation, facial symmetry and tongue midline. DTR are normal bilaterally, cerebella exam is intact, barbinski is negative and strengths are equaled bilaterally.  No sensory loss.   Labs on Admission:  Basic Metabolic Panel:  Recent Labs Lab 07/24/12 1900  NA 141  K 4.0  CL 94*  CO2 24  GLUCOSE 194*  BUN 33*  CREATININE 1.63*  CALCIUM 10.5   Liver Function Tests:  Recent Labs Lab 07/24/12 1900  AST 40*  ALT 34  ALKPHOS 112  BILITOT 0.7  PROT 8.6*  ALBUMIN 4.4   No results found for this basename: LIPASE, AMYLASE,  in the last 168 hours No results found for this basename: AMMONIA,  in the last 168 hours CBC:  Recent Labs Lab 07/24/12 1900  WBC 21.5*  NEUTROABS 18.7*  HGB 17.0*  HCT  48.6*  MCV 85.0  PLT 219   Cardiac Enzymes: No results found for this basename: CKTOTAL, CKMB, CKMBINDEX, TROPONINI,  in the last 168 hours  CBG: No results found for this basename: GLUCAP,  in the last 168 hours   Radiological Exams on Admission: Ct Abdomen Pelvis Wo Contrast  07/24/2012   *RADIOLOGY REPORT*  Clinical Data: Lower abdominal pain.  Rectal bleeding.  CT ABDOMEN AND PELVIS WITHOUT CONTRAST  Technique:  Multidetector CT imaging of the abdomen and pelvis was performed following the standard protocol without intravenous contrast.  Comparison: 03/26/2009  Findings: Linear scarring in the lung bases.  No effusions.  Heart is normal size.  Liver, spleen, gallbladder, pancreas, adrenals and kidneys have an unremarkable unenhanced appearance.  Tiny nonobstructing stone in the lower pole of the right kidney.  There appears to be circumferential wall thickening involving the descending colon.  Small amount of fluid in the left paracolic gutter.  Findings compatible with colitis.  Small bowel is decompressed.  No free air or adenopathy.  Aorta and iliac vessels are heavily calcified.  IMPRESSION: Descending colonic wall thickening with small amount adjacent fluid.  Findings compatible with colitis, favor infectious although ischemic cannot be excluded.   Original Report Authenticated By: Charlett Nose, M.D.    Assessment/Plan Present on Admission:  . Bloody diarrhea . Abdominal pain . Smoker . OSTEOPOROSIS . LOW BACK PAIN, CHRONIC . HYPERLIPIDEMIA . Impaired glucose tolerance . CAROTID ARTERY STENOSIS, BILATERAL  PLAN:  Will admit her for colitis.  I suspect ischemic vs infectious colitis.  Given her elevated WBC (could be stress demargination vs hemoconcentration given she has elevated BUN, Cr, and Hb), I will start her to Cipro.  Will hold her ASA.  Gi has been consulted and she was made NPO in the likely hood she may need colonsocopy.  She is otherwise stable, full code, and will be  admitted to [facility] service.  Will obtain serial H/H and transfuse only if necessary.  Thank you for allowing me to partake in the care of this nice patient.  Other plans as per orders.  Code Status: FULL Unk Lightning, MD. [facility] Pager [phone] 7pm to 7am.  07/25/2012, 12:40 AM

## 2012-07-25 NOTE — Progress Notes (Addendum)
[facility] PROGRESS NOTE  [name] UXN:[id number] DOB: 11-10-1940 DOA: 07/24/2012 PCP: Oliver Barre, MD  Assessment/Plan: 1. Abdominal Pain/Bloody diarrhea - At this point GI on board. - Gi suspecting acute ischemic colitis.  Given that infectious etiology remains on the differential plan is to continue cipro until stool studies available.  2. Acute renal insufficiency - At this point most likely prerenal - Agree with IVF's at this juncture - Will reassess S creatinine next am  3. HPL - stable on statin  4. HTN - At this point will discontinue clonidine given history of GI bleed and soft blood pressures. - Place PRN hydralazine orders should SBP > 180 or DBP > 110  Addendum: 5. Impaired glucose tolerance Not on any home hypoglycemic agents. Will assess hgba1c Continue SSI  6. Hypokalemia - will replace orally and reassess  Code Status: full Family Communication: No family at bedside Disposition Plan: Pending clinical condition and recommendations from GI.   Consultants:  GI: [place]  Procedures:  none  Antibiotics:  Cipro  HPI/Subjective: Patient has no new complaints. No acute issues reported overnight.  Objective: Filed Vitals:   07/24/12 1844 07/24/12 1845 07/24/12 2356 07/25/12 0531  BP: 210/100 210/100 170/91 92/45  Pulse: 75 83 78 70  Temp:  98.3 F (36.8 C)  98.7 F (37.1 C)  TempSrc:  Oral  Oral  Resp: 21 25 18 18   Height:    5\' 4"  (1.626 m)  Weight:    44.3 kg (97 lb 10.6 oz)  SpO2: 99% 92% 96% 98%    Intake/Output Summary (Last 24 hours) at 07/25/12 1322 Last data filed at 07/25/12 2542  Gross per 24 hour  Intake      0 ml  Output    400 ml  Net   -400 ml   Filed Weights   07/25/12 0531  Weight: 44.3 kg (97 lb 10.6 oz)    Exam:   General:  Pt in NAD, Alert and Awake  Cardiovascular: RRR, no MRG  Respiratory: CTA BL, no wheezes  Abdomen: + bowel sounds, soft, ND  Musculoskeletal: no cyanosis or clubbing.    Data Reviewed: Basic Metabolic Panel:  Recent Labs Lab 07/24/12 1900  NA 141  K 4.0  CL 94*  CO2 24  GLUCOSE 194*  BUN 33*  CREATININE 1.63*  CALCIUM 10.5   Liver Function Tests:  Recent Labs Lab 07/24/12 1900  AST 40*  ALT 34  ALKPHOS 112  BILITOT 0.7  PROT 8.6*  ALBUMIN 4.4   No results found for this basename: LIPASE, AMYLASE,  in the last 168 hours No results found for this basename: AMMONIA,  in the last 168 hours CBC:  Recent Labs Lab 07/24/12 1900  WBC 21.5*  NEUTROABS 18.7*  HGB 17.0*  HCT 48.6*  MCV 85.0  PLT 219   Cardiac Enzymes: No results found for this basename: CKTOTAL, CKMB, CKMBINDEX, TROPONINI,  in the last 168 hours BNP (last 3 results) No results found for this basename: PROBNP,  in the last 8760 hours CBG:  Recent Labs Lab 07/25/12 0039 07/25/12 0354 07/25/12 0721 07/25/12 1216  GLUCAP 106* 117* 131* 120*    No results found for this or any previous visit (from the past 240 hour(s)).   Studies: Ct Abdomen Pelvis Wo Contrast  07/24/2012   *RADIOLOGY REPORT*  Clinical Data: Lower abdominal pain.  Rectal bleeding.  CT ABDOMEN AND PELVIS WITHOUT CONTRAST  Technique:  Multidetector CT imaging of the abdomen and pelvis was  performed following the standard protocol without intravenous contrast.  Comparison: 03/26/2009  Findings: Linear scarring in the lung bases.  No effusions.  Heart is normal size.  Liver, spleen, gallbladder, pancreas, adrenals and kidneys have an unremarkable unenhanced appearance.  Tiny nonobstructing stone in the lower pole of the right kidney.  There appears to be circumferential wall thickening involving the descending colon.  Small amount of fluid in the left paracolic gutter.  Findings compatible with colitis.  Small bowel is decompressed.  No free air or adenopathy.  Aorta and iliac vessels are heavily calcified.  IMPRESSION: Descending colonic wall thickening with small amount adjacent fluid.  Findings  compatible with colitis, favor infectious although ischemic cannot be excluded.   Original Report Authenticated By: Charlett Nose, M.D.    Scheduled Meds: . [START ON 07/26/2012] ciprofloxacin  400 mg Intravenous Q24H  . cloNIDine  0.2 mg Oral BID  . insulin aspart  0-9 Units Subcutaneous Q4H  . oxybutynin  10 mg Oral Daily  . simvastatin  40 mg Oral q1800  . sodium chloride  3 mL Intravenous Q12H   Continuous Infusions: . dextrose 5 % and 0.9% NaCl 50 mL/hr at 07/25/12 0115    Principal Problem:   Bloody diarrhea Active Problems:   HYPERLIPIDEMIA   CAROTID ARTERY STENOSIS, BILATERAL   LOW BACK PAIN, CHRONIC   OSTEOPOROSIS   Impaired glucose tolerance   Smoker   Abdominal pain    Time spent: > 35 minutes    Penny Pia  [facility] Pager [phone] If 7PM-7AM, please contact night-coverage at [email or web address], password [facility] 07/25/2012, 1:22 PM  LOS: 1 day

## 2012-07-25 NOTE — Consult Note (Signed)
[facility] Consultation  Referring Provider:     [facility] Primary Care Physician:  Oliver Barre, MD Primary Gastroenterologist:   None       Reason for Consultation:   colitis         HPI:   [name] is a 72 y.o. female with multiple medical problems admitted around midnight with abdominal cramps and bloody diarrhea. WBC elevated at 21.5. She has acute renal insufficiency. Lactic acid elevated at 5.3. CTscan c/w descending  colitis.   Patient denies any chronic GI problems. She is beginning to feel a little better today. Upon further questioning patient didn't really have diarrhea rather passage of bright red blood. Bleeding started about 3 days ago. Lower abdominal cramps began after onset of bleeding. She hasn't had any bleeding or BMs this am. Patient reports a few pound weight loss recently. Her son had "something wrong with his colon" but not sure if it was colon cancer.    Past Medical History  Diagnosis Date  . THYROID NODULE, RIGHT 02/01/2009  . GLUCOSE INTOLERANCE 11/04/2007  . HYPERLIPIDEMIA 02/12/2007  . HYPOKALEMIA 11/04/2007  . ANXIETY 02/12/2007  . DEPRESSION 02/12/2007  . HYPERTENSION 11/07/2006  . CORONARY ARTERY DISEASE 11/04/2007  . CAROTID ARTERY STENOSIS, BILATERAL 02/24/2007  . Unspecified Peripheral Vascular Disease 02/12/2007  . ASTHMATIC BRONCHITIS, ACUTE 04/22/2008  . ALLERGIC RHINITIS 02/12/2007  . COPD 11/04/2007  . GERD 02/12/2007  . CONSTIPATION 02/12/2007  . DEGENERATIVE JOINT DISEASE, FINGERS 03/04/2008  . LOW BACK PAIN, CHRONIC 02/01/2009  . BACK PAIN, RIGHT 12/05/2009  . LEG PAIN, BILATERAL 02/01/2009  . OSTEOPOROSIS 02/12/2007  . DIZZINESS 02/12/2007  . INSOMNIA-SLEEP DISORDER-UNSPEC 08/04/2007  . FATIGUE 02/12/2007  . CHEST PAIN 02/12/2007  . Coronary artery disease 9/09    nonobstructive by cath  . Peripheral vascular disease 9/09    left carotid 60-80%   . Impaired glucose tolerance 05/11/2010  . Leg pain, bilateral 05/12/2010  . Syncope  and collapse   . Essential hypertension, benign 06/17/2012    Past Surgical History  Procedure Laterality Date  . S/p bowel obstruction    . Abdominal hysterectomy    . Appendectomy    . Oophorectomy    . Tonsillectomy    . S/p right cea  2/09  . S/p right thyroid nodule biopsy  March 2011    negative  . Carotid endarterectomy    . Cardiac catheterization      Family History  Problem Relation Age of Onset  . Cancer Son     Possible colon cancer  . Heart attack Other   . Stroke Other   . Alcohol abuse Other   . Diabetes Other   . Hypertension Other      History  Substance Use Topics  . Smoking status: Current Every Day Smoker  . Smokeless tobacco: Not on file  . Alcohol Use: No    Prior to Admission medications   Medication Sig Start Date End Date Taking? Authorizing Provider  acetaminophen-codeine (TYLENOL #3) 300-30 MG per tablet Take 1 tablet by mouth every 4 (four) hours as needed for pain.   Yes Historical Provider, MD  alendronate (FOSAMAX) 70 MG tablet Take 70 mg by mouth every 7 (seven) days. Take with a full glass of water on an empty stomach.   Yes Historical Provider, MD  aspirin 81 MG EC tablet Take 81 mg by mouth daily.     Yes Historical Provider, MD  atenolol-chlorthalidone (TENORETIC) 50-25 MG per tablet Take  1 tablet by mouth daily. 06/17/12  Yes Corwin Levins, MD  cholecalciferol (VITAMIN D) 1000 UNITS tablet Take 1,000 Units by mouth daily.    Yes Historical Provider, MD  cloNIDine (CATAPRES) 0.2 MG tablet Take 1 tablet (0.2 mg total) by mouth 2 (two) times daily. 06/17/12  Yes Corwin Levins, MD  lisinopril (PRINIVIL,ZESTRIL) 20 MG tablet Take 1 tablet (20 mg total) by mouth daily. 07/09/12  Yes Corwin Levins, MD  simvastatin (ZOCOR) 40 MG tablet TAKE 1 TABLET (40 MG TOTAL) BY MOUTH DAILY. 06/17/12  Yes Corwin Levins, MD  oxybutynin (DITROPAN-XL) 10 MG 24 hr tablet Take 1 tablet (10 mg total) by mouth daily. 06/17/12   Corwin Levins, MD  zolpidem (AMBIEN) 5 MG  tablet Take 1 tablet (5 mg total) by mouth at bedtime as needed for sleep. 06/17/12   Corwin Levins, MD    Current Facility-Administered Medications  Medication Dose Route Frequency Provider Last Rate Last Dose  . acetaminophen-codeine (TYLENOL #3) 300-30 MG per tablet 1 tablet  1 tablet Oral Q4H PRN Houston Siren, MD      . Melene Muller ON 07/26/2012] ciprofloxacin (CIPRO) IVPB 400 mg  400 mg Intravenous Q24H Penny Pia, MD      . cloNIDine (CATAPRES) tablet 0.2 mg  0.2 mg Oral BID Houston Siren, MD   0.2 mg at 07/25/12 1008  . dextrose 5 %-0.9 % sodium chloride infusion   Intravenous Continuous Houston Siren, MD 50 mL/hr at 07/25/12 0115    . insulin aspart (novoLOG) injection 0-9 Units  0-9 Units Subcutaneous Q4H Houston Siren, MD   1 Units at 07/25/12 [phone]  . morphine 2 MG/ML injection 1 mg  1 mg Intravenous Q2H PRN Houston Siren, MD      . ondansetron [facility]) tablet 4 mg  4 mg Oral Q6H PRN Houston Siren, MD       Or  . ondansetron [facility]) injection 4 mg  4 mg Intravenous Q6H PRN Houston Siren, MD      . oxybutynin (DITROPAN-XL) 24 hr tablet 10 mg  10 mg Oral Daily Houston Siren, MD   10 mg at 07/25/12 1008  . simvastatin (ZOCOR) tablet 40 mg  40 mg Oral q1800 Houston Siren, MD      . sodium chloride 0.9 % injection 3 mL  3 mL Intravenous Q12H Houston Siren, MD   3 mL at 07/25/12 0119    Allergies as of 07/24/2012 - Review Complete 07/24/2012  Allergen Reaction Noted  . Tramadol  11/10/2010   Review of Systems:    All systems reviewed and negative except where noted in HPI.    Physical Exam:  Vital signs in last 24 hours: Temp:  [98.3 F (36.8 C)-98.7 F (37.1 C)] 98.7 F (37.1 C) (06/27 0531) Pulse Rate:  [70-83] 70 (06/27 0531) Resp:  [18-25] 18 (06/27 0531) BP: (92-210)/(45-100) 92/45 mmHg (06/27 0531) SpO2:  [92 %-99 %] 98 % (06/27 0531) Weight:  [97 lb 10.6 oz (44.3 kg)] 97 lb 10.6 oz (44.3 kg) (06/27 0531) Last BM Date: 07/24/12 General:   Pleasant black female in NAD Head:  Normocephalic and atraumatic. Eyes:   No  icterus.   Conjunctiva pink. Ears:  Normal auditory acuity. Neck:  Supple; no masses felt Lungs:  Respirations even and unlabored. Lungs clear to auscultation bilaterally.   No wheezes, crackles, or rhonchi.  Heart:  Regular rate and rhythm;  Loud murmur heard best over 2nd right intercostal. . Abdomen:  Soft, nondistended,moderate  LLQ tenderness.  Normal bowel sounds. No appreciable masses or hepatomegaly.  Rectal:  Not performed.  Msk:  Symmetrical without gross deformities.  Extremities:  Without edema. Neurologic:  Alert and  oriented x4;  grossly normal neurologically. Skin:  Intact without significant lesions or rashes. Cervical Nodes:  No significant cervical adenopathy. Psych:  Alert and cooperative. Normal affect.  LAB RESULTS:  Recent Labs  07/24/12 1900  WBC 21.5*  HGB 17.0*  HCT 48.6*  PLT 219   BMET  Recent Labs  07/24/12 1900  NA 141  K 4.0  CL 94*  CO2 24  GLUCOSE 194*  BUN 33*  CREATININE 1.63*  CALCIUM 10.5   LFT  Recent Labs  07/24/12 1900  PROT 8.6*  ALBUMIN 4.4  AST 40*  ALT 34  ALKPHOS 112  BILITOT 0.7    STUDIES: Ct Abdomen Pelvis Wo Contrast  07/24/2012   *RADIOLOGY REPORT*  Clinical Data: Lower abdominal pain.  Rectal bleeding.  CT ABDOMEN AND PELVIS WITHOUT CONTRAST  Technique:  Multidetector CT imaging of the abdomen and pelvis was performed following the standard protocol without intravenous contrast.  Comparison: 03/26/2009  Findings: Linear scarring in the lung bases.  No effusions.  Heart is normal size.  Liver, spleen, gallbladder, pancreas, adrenals and kidneys have an unremarkable unenhanced appearance.  Tiny nonobstructing stone in the lower pole of the right kidney.  There appears to be circumferential wall thickening involving the descending colon.  Small amount of fluid in the left paracolic gutter.  Findings compatible with colitis.  Small bowel is decompressed.  No free air or adenopathy.  Aorta and iliac vessels are heavily  calcified.  IMPRESSION: Descending colonic wall thickening with small amount adjacent fluid.  Findings compatible with colitis, favor infectious although ischemic cannot be excluded.   Original Report Authenticated By: Charlett Nose, M.D.    PREVIOUS ENDOSCOPIES:            reports colonoscopy greater than 10 years ago by [facility].    Impression / Plan:   53. 72 year old with acute colitis, suspect ischemic given hematochezia. Her hgb is normal which is interesting given 3 days of bleeding.  Patient doesn't really describe diarrhea but infectious etiology remains on differential diagnosis list. Continue supportive care for now. If stool studies come back negative would d/c cipro.   2. Acute renal insufficiency, creatinine 1.63 upon admission last night. Her baseline creatinine is normal. Will recheck BMET this am and make sure renal function improved after fluid resuscitation. IV fluids currently at 50cc/hr.   3. Multiple medical problems as listed in PMH. Patient looks amazingly well.  Thanks   LOS: 1 day   Willette Cluster  07/25/2012, 10:56 AM   GI ATTENDING  History, laboratories, x-rays reviewed. Patient personally seen and examined. Agree with H&P as outlined above. Clinical history is most consistent with acute ischemic colitis. A bit better today. Continue with supportive care including hydration, advancement of diet as tolerated. Continue to monitor her physical exam and stools as well as laboratories. We'll follow with you  Wilhemina Bonito. Eda Keys., M.D. [facility] Division of Gastroenterology

## 2012-07-26 DIAGNOSIS — K219 Gastro-esophageal reflux disease without esophagitis: Secondary | ICD-10-CM

## 2012-07-26 DIAGNOSIS — D72829 Elevated white blood cell count, unspecified: Secondary | ICD-10-CM

## 2012-07-26 DIAGNOSIS — F329 Major depressive disorder, single episode, unspecified: Secondary | ICD-10-CM

## 2012-07-26 LAB — GLUCOSE, CAPILLARY
Glucose-Capillary: 71 mg/dL (ref 70–99)
Glucose-Capillary: 86 mg/dL (ref 70–99)
Glucose-Capillary: 106 mg/dL — ABNORMAL HIGH (ref 70–99)
Glucose-Capillary: 144 mg/dL — ABNORMAL HIGH (ref 70–99)
Glucose-Capillary: 95 mg/dL (ref 70–99)

## 2012-07-26 LAB — CBC
Hemoglobin: 12.1 g/dL (ref 12.0–15.0)
MCHC: 32.9 g/dL (ref 30.0–36.0)
Platelets: 121 10*3/uL — ABNORMAL LOW (ref 150–400)
RBC: 4.12 MIL/uL (ref 3.87–5.11)

## 2012-07-26 LAB — BASIC METABOLIC PANEL
Chloride: 104 mEq/L (ref 96–112)
GFR calc Af Amer: 58 mL/min — ABNORMAL LOW (ref >90)
GFR calc non Af Amer: 50 mL/min — ABNORMAL LOW (ref >90)
Potassium: 3.2 mEq/L — ABNORMAL LOW (ref 3.5–5.1)
Sodium: 139 mEq/L (ref 135–145)

## 2012-07-26 MED ORDER — HYDRALAZINE HCL 20 MG/ML IJ SOLN
5.0000 mg | Freq: Once | INTRAMUSCULAR | Status: AC
  Administered 2012-07-26: 5 mg via INTRAVENOUS
  Filled 2012-07-26: qty 1

## 2012-07-26 NOTE — ED Provider Notes (Signed)
Medical screening examination/treatment/procedure(s) were conducted as a shared visit with non-physician practitioner(s) and myself.  I personally evaluated the patient during the encounter  The patient will be admitted for what appears to be ischemic colitis.  No peritonitis on exam.  Hospitalist admission.  GI consult  Lyanne Co, MD 07/26/12 [phone]

## 2012-07-26 NOTE — Progress Notes (Signed)
[facility] PROGRESS NOTE  [name] GNF:[id number] DOB: December 30, 1940 DOA: 07/24/2012 PCP: Oliver Barre, MD  Assessment/Plan: 1. Abdominal Pain/Bloody diarrhea - At this point GI on board. - Gi suspecting acute ischemic colitis.  Considering discontinuing antibiotic. WBC within normal limits. - On full liquid diet.  2. Acute renal insufficiency - At this point most likely prerenal - Improved with IVF administration.  3. HPL - stable on statin  4. HTN - At this point will discontinue clonidine given history of GI bleed and soft blood pressures. - Place PRN hydralazine orders should SBP > 180 or DBP > 110  5. Impaired glucose tolerance Not on any home hypoglycemic agents. hgbA1c within normal limits. Continue SSI  6. Hypokalemia - Resolved after oral replacement.  Code Status: full Family Communication: No family at bedside Disposition Plan: Pending clinical condition and recommendations from GI.   Consultants:  GI: [place]  Procedures:  none  Antibiotics:  Cipro  HPI/Subjective: Patient has no new complaints. No acute issues reported overnight.  Objective: Filed Vitals:   07/24/12 2356 07/25/12 0531 07/25/12 1403 07/26/12 0600  BP: 170/91 92/45 120/63 161/74  Pulse: 78 70 66 64  Temp:  98.7 F (37.1 C) 98.3 F (36.8 C) 98.5 F (36.9 C)  TempSrc:  Oral Oral Oral  Resp: 18 18 18 18   Height:  5\' 4"  (1.626 m)    Weight:  44.3 kg (97 lb 10.6 oz)    SpO2: 96% 98% 97% 98%   No intake or output data in the 24 hours ending 07/26/12 1355 Filed Weights   07/25/12 0531  Weight: 44.3 kg (97 lb 10.6 oz)    Exam:   General:  Pt in NAD, Alert and Awake  Cardiovascular: RRR, no MRG  Respiratory: CTA BL, no wheezes  Abdomen: + bowel sounds, soft, ND  Musculoskeletal: no cyanosis or clubbing.   Data Reviewed: Basic Metabolic Panel:  Recent Labs Lab 07/24/12 1900 07/25/12 1310 07/26/12 0604  NA 141 140 139  K 4.0 3.0* 3.2*  CL 94* 104 104   CO2 24 29 29   GLUCOSE 194* 114* 72  BUN 33* 29* 19  CREATININE 1.63* 1.28* 1.10  CALCIUM 10.5 7.8* 8.0*   Liver Function Tests:  Recent Labs Lab 07/24/12 1900  AST 40*  ALT 34  ALKPHOS 112  BILITOT 0.7  PROT 8.6*  ALBUMIN 4.4   No results found for this basename: LIPASE, AMYLASE,  in the last 168 hours No results found for this basename: AMMONIA,  in the last 168 hours CBC:  Recent Labs Lab 07/24/12 1900 07/26/12 1129  WBC 21.5* 9.0  NEUTROABS 18.7*  --   HGB 17.0* 12.1  HCT 48.6* 36.8  MCV 85.0 89.3  PLT 219 121*   Cardiac Enzymes: No results found for this basename: CKTOTAL, CKMB, CKMBINDEX, TROPONINI,  in the last 168 hours BNP (last 3 results) No results found for this basename: PROBNP,  in the last 8760 hours CBG:  Recent Labs Lab 07/26/12 0520 07/26/12 0549 07/26/12 0608 07/26/12 0743 07/26/12 1129  GLUCAP 72 71 86 106* 144*    No results found for this or any previous visit (from the past 240 hour(s)).   Studies: Ct Abdomen Pelvis Wo Contrast  07/24/2012   *RADIOLOGY REPORT*  Clinical Data: Lower abdominal pain.  Rectal bleeding.  CT ABDOMEN AND PELVIS WITHOUT CONTRAST  Technique:  Multidetector CT imaging of the abdomen and pelvis was performed following the standard protocol without intravenous contrast.  Comparison: 03/26/2009  Findings: Linear scarring in the lung bases.  No effusions.  Heart is normal size.  Liver, spleen, gallbladder, pancreas, adrenals and kidneys have an unremarkable unenhanced appearance.  Tiny nonobstructing stone in the lower pole of the right kidney.  There appears to be circumferential wall thickening involving the descending colon.  Small amount of fluid in the left paracolic gutter.  Findings compatible with colitis.  Small bowel is decompressed.  No free air or adenopathy.  Aorta and iliac vessels are heavily calcified.  IMPRESSION: Descending colonic wall thickening with small amount adjacent fluid.  Findings compatible  with colitis, favor infectious although ischemic cannot be excluded.   Original Report Authenticated By: Charlett Nose, M.D.    Scheduled Meds: . ciprofloxacin  400 mg Intravenous Q24H  . insulin aspart  0-9 Units Subcutaneous Q4H  . oxybutynin  10 mg Oral Daily  . simvastatin  40 mg Oral q1800  . sodium chloride  3 mL Intravenous Q12H   Continuous Infusions: . dextrose 5 % and 0.9% NaCl 50 mL/hr at 07/25/12 1600    Principal Problem:   Bloody diarrhea Active Problems:   HYPERLIPIDEMIA   CAROTID ARTERY STENOSIS, BILATERAL   LOW BACK PAIN, CHRONIC   OSTEOPOROSIS   Impaired glucose tolerance   Smoker   Abdominal pain   Hypokalemia    Time spent: > 35 minutes    Penny Pia  [facility] Pager [phone] If 7PM-7AM, please contact night-coverage at [email or web address], password [facility] 07/26/2012, 1:55 PM  LOS: 2 days

## 2012-07-26 NOTE — Progress Notes (Signed)
Patient ID: [name], female   DOB: Jun 23, 1940, 72 y.o.   MRN: [id number] [facility] Progress Note  Subjective: Feels better, still with lower abdominal pain, but decreased. Hungry, no nausea. Had one BM today loose with pinkish blood  Objective:  Vital signs in last 24 hours: Temp:  [98.3 F (36.8 C)-98.5 F (36.9 C)] 98.5 F (36.9 C) (06/28 0600) Pulse Rate:  [64-66] 64 (06/28 0600) Resp:  [18] 18 (06/28 0600) BP: (120-161)/(63-74) 161/74 mmHg (06/28 0600) SpO2:  [97 %-98 %] 98 % (06/28 0600) Last BM Date: 07/24/12 General:   Alert,  Well-developed,Thin  Older AA female    in NAD Heart:  Regular rate and rhythm; no murmurs Pulm;clear Abdomen:  Soft, tender LMQ/LLQ and nondistended. Normal bowel sounds, without guarding, and without rebound.   Extremities:  Without edema. Neurologic:  Alert and  oriented x4;  grossly normal neurologically. Psych:  Alert and cooperative. Normal mood and affect.  Intake/Output from previous day: 06/27 0701 - 06/28 0700 In: -  Out: 150 [Urine:150] Intake/Output this shift:    Lab Results:  Recent Labs  07/24/12 1900  WBC 21.5*  HGB 17.0*  HCT 48.6*  PLT 219   BMET  Recent Labs  07/24/12 1900 07/25/12 1310 07/26/12 0604  NA 141 140 139  K 4.0 3.0* 3.2*  CL 94* 104 104  CO2 24 29 29   GLUCOSE 194* 114* 72  BUN 33* 29* 19  CREATININE 1.63* 1.28* 1.10  CALCIUM 10.5 7.8* 8.0*   LFT  Recent Labs  07/24/12 1900  PROT 8.6*  ALBUMIN 4.4  AST 40*  ALT 34  ALKPHOS 112  BILITOT 0.7    Assessment / Plan: #1 72 yo female with acute colitis- suspect segmental ischemic  Colitis, r/o infectious No stools collected as yet for cultures- on empiric cipro Advance to full liquid diet Check CBC #2 Acute renal insufficiency- improved #3 CAD #4 HTN  Principal Problem:   Bloody diarrhea Active Problems:   HYPERLIPIDEMIA   CAROTID ARTERY STENOSIS, BILATERAL   LOW BACK PAIN, CHRONIC   OSTEOPOROSIS   Impaired  glucose tolerance   Smoker   Abdominal pain   Hypokalemia     LOS: 2 days   Amy Esterwood  07/26/2012, 10:50 AM   GI ATTENDING  Patient personally seen and examined. Interval history reviewed. Clinically improving. I still favor ischemic colitis. Agree with rechecking CBC. Probably does not need Cipro. Advance diet as tolerated. Hopefully home in a day or 2.  Wilhemina Bonito. Eda Keys., M.D. [facility] Division of Gastroenterology

## 2012-07-27 LAB — GLUCOSE, CAPILLARY
Glucose-Capillary: 92 mg/dL (ref 70–99)
Glucose-Capillary: 102 mg/dL — ABNORMAL HIGH (ref 70–99)

## 2012-07-27 NOTE — Progress Notes (Signed)
Patient ID: [name], female   DOB: 12-Dec-1940, 72 y.o.   MRN: [id number] [facility] Progress Note  Subjective: Feeling better- wants solid food. No bleeding, only mild abdominal cramping  Objective:  Vital signs in last 24 hours: Temp:  [98.3 F (36.8 C)-99.4 F (37.4 C)] 98.3 F (36.8 C) (06/29 0600) Pulse Rate:  [68-92] 88 (06/29 0600) Resp:  [18-20] 20 (06/29 0600) BP: (176-203)/(72-90) 178/76 mmHg (06/29 0600) SpO2:  [97 %-100 %] 99 % (06/29 0600) Last BM Date: 07/26/12 General:   Alert,  Well-developed, thin AA female    in NAD Heart:  Regular rate and rhythm; no murmurs Pulm;clear Abdomen:  Soft, tender LMQ/LLQ and nondistended. Normal bowel sounds, without guarding, and without rebound.   Extremities:  Without edema. Neurologic:  Alert and  oriented x4;  grossly normal neurologically. Psych:  Alert and cooperative. Normal mood and affect.  Intake/Output from previous day: 06/28 0701 - 06/29 0700 In: 2087.5 [I.V.:2087.5] Out: -  Intake/Output this shift:    Lab Results:  Recent Labs  07/24/12 1900 07/26/12 1129  WBC 21.5* 9.0  HGB 17.0* 12.1  HCT 48.6* 36.8  PLT 219 121*   BMET  Recent Labs  07/24/12 1900 07/25/12 1310 07/26/12 0604  NA 141 140 139  K 4.0 3.0* 3.2*  CL 94* 104 104  CO2 24 29 29   GLUCOSE 194* 114* 72  BUN 33* 29* 19  CREATININE 1.63* 1.28* 1.10  CALCIUM 10.5 7.8* 8.0*   LFT  Recent Labs  07/24/12 1900  PROT 8.6*  ALBUMIN 4.4  AST 40*  ALT 34  ALKPHOS 112  BILITOT 0.7    Assessment / Plan: #1 72 yo female with resolving acute colitis  Most consistent with acute segmental colitis. She is stable for discharge today Advance to soft bland Diet Push oral fluids Can D/C antibiotics Will follow up in office in 2 weeks(Dr. Marina Goodell or  Amy Esterwood PA-C) ,and discuss colonoscopy, our office will call her with appt  Principal Problem:   Bloody diarrhea Active Problems:   HYPERLIPIDEMIA   CAROTID ARTERY  STENOSIS, BILATERAL   LOW BACK PAIN, CHRONIC   OSTEOPOROSIS   Impaired glucose tolerance   Smoker   Abdominal pain   Hypokalemia     LOS: 3 days   Amy Esterwood  07/27/2012, 9:22 AM   GI ATTENDING  Patient seen and examined. Interval history and laboratories reviewed. She is significantly improved. Tolerating diet. White blood cell count normal. Okay for discharge. GI followup as outlined above.  Wilhemina Bonito. Eda Keys., M.D. [facility] Division of Gastroenterology

## 2012-07-27 NOTE — Progress Notes (Signed)
Patient discharge home, Patient discharge instruction given understanding verbalized by patient and her ganddaughter. No additional questions at this time. Will continue to monitor.

## 2012-07-27 NOTE — Progress Notes (Signed)
Physician Discharge Summary  [name]:[id number] DOB: January 11, 1941 DOA: 07/24/2012  PCP: Oliver Barre, MD  Admit date: 07/24/2012 Discharge date: 07/27/2012  Time spent: > 35 minutes  Recommendations for Outpatient Follow-up:  1. Be sure to follow up with your GI doctor in 2 weeks 2. Otherwise follow up with your pcp as needed  Discharge Diagnoses:  Principal Problem:   Bloody diarrhea Active Problems:   HYPERLIPIDEMIA   CAROTID ARTERY STENOSIS, BILATERAL   LOW BACK PAIN, CHRONIC   OSTEOPOROSIS   Impaired glucose tolerance   Smoker   Abdominal pain   Hypokalemia   Discharge Condition: stable  Diet recommendation: Low sodium heart healthy  Filed Weights   07/25/12 0531  Weight: 44.3 kg (97 lb 10.6 oz)    History of present illness:  72 y/o female with hx of hpl, carotid stenosis, cad, pvd that presented to the ED complaining of abdominal cramps and bloody diarrhea.  Hospital Course:  1. Abdominal Pain/Bloody diarrhea - At this point GI on board while patient in house. They have made the following assessment and plans: 72 yo female with resolving acute colitis Most consistent with acute segmental colitis.  She is stable for discharge today  Advance to soft bland Diet  Push oral fluids  Can D/C antibiotics  Will follow up in office in 2 weeks(Dr. Marina Goodell or Amy Monica Becton PA-C) ,and discuss colonoscopy, our office will call her with appt  2. Acute renal insufficiency  - Resolved and was likely prerenal causes given history - Improved after IVF administration and improved oral intake.   3. HPL  - stable on statin   4. HTN  - Patient to continue home regimen on discharge.  5. Impaired glucose tolerance  - Patient to follow up with primary care physician for further evaluation and recommendations - Last hgb A1c within normal limits.  6. Hypokalemia  - Resolved after oral replacement   Procedures:  None  Consultations:  GI: [place]  Discharge  Exam: Filed Vitals:   07/26/12 2330 07/27/12 0044 07/27/12 0600 07/27/12 1100  BP: 197/89 184/80 178/76 221/91  Pulse: 92  88 80  Temp:   98.3 F (36.8 C)   TempSrc:   Oral   Resp:   20   Height:      Weight:      SpO2:   99%     General: Pt in NAD, Alert and Awake Cardiovascular: RRR, no MRG Respiratory: CTA BL, no wheezes  Discharge Instructions  Discharge Orders   Future Appointments Provider Department Dept Phone   08/05/2012 7:30 AM Sherrie George, MD [facility] [phone]   Future Orders Complete By Expires     Call MD for:  severe uncontrolled pain  As directed     Call MD for:  temperature >100.4  As directed     Diet - low sodium heart healthy  As directed     Increase activity slowly  As directed         Medication List    STOP taking these medications       aspirin 81 MG EC tablet      TAKE these medications       acetaminophen-codeine 300-30 MG per tablet  Commonly known as:  TYLENOL #3  Take 1 tablet by mouth every 4 (four) hours as needed for pain.     alendronate 70 MG tablet  Commonly known as:  FOSAMAX  Take 70 mg by mouth every  7 (seven) days. Take with a full glass of water on an empty stomach.     atenolol-chlorthalidone 50-25 MG per tablet  Commonly known as:  TENORETIC  Take 1 tablet by mouth daily.     cholecalciferol 1000 UNITS tablet  Commonly known as:  VITAMIN D  Take 1,000 Units by mouth daily.     cloNIDine 0.2 MG tablet  Commonly known as:  CATAPRES  Take 1 tablet (0.2 mg total) by mouth 2 (two) times daily.     lisinopril 20 MG tablet  Commonly known as:  PRINIVIL,ZESTRIL  Take 1 tablet (20 mg total) by mouth daily.     oxybutynin 10 MG 24 hr tablet  Commonly known as:  DITROPAN-XL  Take 1 tablet (10 mg total) by mouth daily.     simvastatin 40 MG tablet  Commonly known as:  ZOCOR  TAKE 1 TABLET (40 MG TOTAL) BY MOUTH DAILY.     zolpidem 5 MG tablet  Commonly known as:  AMBIEN  Take 1  tablet (5 mg total) by mouth at bedtime as needed for sleep.       Allergies  Allergen Reactions  . Tramadol     constipation       Follow-up Information   Follow up with Yancey Flemings, MD. (office will call you with appt)    Contact information:   [address] [place] [address] [phone]        The results of significant diagnostics from this hospitalization (including imaging, microbiology, ancillary and laboratory) are listed below for reference.    Significant Diagnostic Studies: Ct Abdomen Pelvis Wo Contrast  07/24/2012   *RADIOLOGY REPORT*  Clinical Data: Lower abdominal pain.  Rectal bleeding.  CT ABDOMEN AND PELVIS WITHOUT CONTRAST  Technique:  Multidetector CT imaging of the abdomen and pelvis was performed following the standard protocol without intravenous contrast.  Comparison: 03/26/2009  Findings: Linear scarring in the lung bases.  No effusions.  Heart is normal size.  Liver, spleen, gallbladder, pancreas, adrenals and kidneys have an unremarkable unenhanced appearance.  Tiny nonobstructing stone in the lower pole of the right kidney.  There appears to be circumferential wall thickening involving the descending colon.  Small amount of fluid in the left paracolic gutter.  Findings compatible with colitis.  Small bowel is decompressed.  No free air or adenopathy.  Aorta and iliac vessels are heavily calcified.  IMPRESSION: Descending colonic wall thickening with small amount adjacent fluid.  Findings compatible with colitis, favor infectious although ischemic cannot be excluded.   Original Report Authenticated By: Charlett Nose, M.D.    Microbiology: No results found for this or any previous visit (from the past 240 hour(s)).   Labs: Basic Metabolic Panel:  Recent Labs Lab 07/24/12 1900 07/25/12 1310 07/26/12 0604  NA 141 140 139  K 4.0 3.0* 3.2*  CL 94* 104 104  CO2 24 29 29   GLUCOSE 194* 114* 72  BUN 33* 29* 19  CREATININE 1.63* 1.28* 1.10  CALCIUM  10.5 7.8* 8.0*   Liver Function Tests:  Recent Labs Lab 07/24/12 1900  AST 40*  ALT 34  ALKPHOS 112  BILITOT 0.7  PROT 8.6*  ALBUMIN 4.4   No results found for this basename: LIPASE, AMYLASE,  in the last 168 hours No results found for this basename: AMMONIA,  in the last 168 hours CBC:  Recent Labs Lab 07/24/12 1900 07/26/12 1129  WBC 21.5* 9.0  NEUTROABS 18.7*  --   HGB 17.0*  12.1  HCT 48.6* 36.8  MCV 85.0 89.3  PLT 219 121*   Cardiac Enzymes: No results found for this basename: CKTOTAL, CKMB, CKMBINDEX, TROPONINI,  in the last 168 hours BNP: BNP (last 3 results) No results found for this basename: PROBNP,  in the last 8760 hours CBG:  Recent Labs Lab 07/26/12 1947 07/27/12 0020 07/27/12 0404 07/27/12 0744 07/27/12 1149  GLUCAP 87 92 102* 111* 111*       Signed:  Penny Pia  [facility] 07/27/2012, 11:55 AM

## 2012-07-29 NOTE — Discharge Summary (Signed)
Physician Discharge Summary  Jiana A Mehlhoff MRN:[id number] DOB: 07/08/1940 DOA: 07/24/2012  PCP: James John, MD  Admit date: 07/24/2012 Discharge date: 07/27/2012  Time spent: > 35 minutes  Recommendations for Outpatient Follow-up:  1. Be sure to follow up with your GI doctor in 2 weeks 2. Otherwise follow up with your pcp as needed  Discharge Diagnoses:  Principal Problem:   Bloody diarrhea Active Problems:   HYPERLIPIDEMIA   CAROTID ARTERY STENOSIS, BILATERAL   LOW BACK PAIN, CHRONIC   OSTEOPOROSIS   Impaired glucose tolerance   Smoker   Abdominal pain   Hypokalemia   Discharge Condition: stable  Diet recommendation: Low sodium heart healthy  Filed Weights   07/25/12 0531  Weight: 44.3 kg (97 lb 10.6 oz)    History of present illness:  72 y/o female with hx of hpl, carotid stenosis, cad, pvd that presented to the ED complaining of abdominal cramps and bloody diarrhea.  Hospital Course:  1. Abdominal Pain/Bloody diarrhea - At this point GI on board while patient in house. They have made the following assessment and plans: 72 yo female with resolving acute colitis Most consistent with acute segmental colitis.  She is stable for discharge today  Advance to soft bland Diet  Push oral fluids  Can D/C antibiotics  Will follow up in office in 2 weeks(Dr. Perry or Amy Esterwood PA-C) ,and discuss colonoscopy, our office will call her with appt  2. Acute renal insufficiency  - Resolved and was likely prerenal causes given history - Improved after IVF administration and improved oral intake.   3. HPL  - stable on statin   4. HTN  - Patient to continue home regimen on discharge.  5. Impaired glucose tolerance  - Patient to follow up with primary care physician for further evaluation and recommendations - Last hgb A1c within normal limits.  6. Hypokalemia  - Resolved after oral replacement   Procedures:  None  Consultations:  GI: [place]  Discharge  Exam: Filed Vitals:   07/26/12 2330 07/27/12 0044 07/27/12 0600 07/27/12 1100  BP: 197/89 184/80 178/76 221/91  Pulse: 92  88 80  Temp:   98.3 F (36.8 C)   TempSrc:   Oral   Resp:   20   Height:      Weight:      SpO2:   99%     General: Pt in NAD, Alert and Awake Cardiovascular: RRR, no MRG Respiratory: CTA BL, no wheezes  Discharge Instructions  Discharge Orders   Future Appointments Provider Department Dept Phone   08/05/2012 7:30 AM John D Matthews, MD [facility] [phone]   Future Orders Complete By Expires     Call MD for:  severe uncontrolled pain  As directed     Call MD for:  temperature >100.4  As directed     Diet - low sodium heart healthy  As directed     Increase activity slowly  As directed         Medication List    STOP taking these medications       aspirin 81 MG EC tablet      TAKE these medications       acetaminophen-codeine 300-30 MG per tablet  Commonly known as:  TYLENOL #3  Take 1 tablet by mouth every 4 (four) hours as needed for pain.     alendronate 70 MG tablet  Commonly known as:  FOSAMAX  Take 70 mg by mouth every   7 (seven) days. Take with a full glass of water on an empty stomach.     atenolol-chlorthalidone 50-25 MG per tablet  Commonly known as:  TENORETIC  Take 1 tablet by mouth daily.     cholecalciferol 1000 UNITS tablet  Commonly known as:  VITAMIN D  Take 1,000 Units by mouth daily.     cloNIDine 0.2 MG tablet  Commonly known as:  CATAPRES  Take 1 tablet (0.2 mg total) by mouth 2 (two) times daily.     lisinopril 20 MG tablet  Commonly known as:  PRINIVIL,ZESTRIL  Take 1 tablet (20 mg total) by mouth daily.     oxybutynin 10 MG 24 hr tablet  Commonly known as:  DITROPAN-XL  Take 1 tablet (10 mg total) by mouth daily.     simvastatin 40 MG tablet  Commonly known as:  ZOCOR  TAKE 1 TABLET (40 MG TOTAL) BY MOUTH DAILY.     zolpidem 5 MG tablet  Commonly known as:  AMBIEN  Take 1  tablet (5 mg total) by mouth at bedtime as needed for sleep.       Allergies  Allergen Reactions  . Tramadol     constipation       Follow-up Information   Follow up with John Perry, MD. (office will call you with appt)    Contact information:   [address]  [place] [address] [phone]        The results of significant diagnostics from this hospitalization (including imaging, microbiology, ancillary and laboratory) are listed below for reference.    Significant Diagnostic Studies: Ct Abdomen Pelvis Wo Contrast  07/24/2012   *RADIOLOGY REPORT*  Clinical Data: Lower abdominal pain.  Rectal bleeding.  CT ABDOMEN AND PELVIS WITHOUT CONTRAST  Technique:  Multidetector CT imaging of the abdomen and pelvis was performed following the standard protocol without intravenous contrast.  Comparison: 03/26/2009  Findings: Linear scarring in the lung bases.  No effusions.  Heart is normal size.  Liver, spleen, gallbladder, pancreas, adrenals and kidneys have an unremarkable unenhanced appearance.  Tiny nonobstructing stone in the lower pole of the right kidney.  There appears to be circumferential wall thickening involving the descending colon.  Small amount of fluid in the left paracolic gutter.  Findings compatible with colitis.  Small bowel is decompressed.  No free air or adenopathy.  Aorta and iliac vessels are heavily calcified.  IMPRESSION: Descending colonic wall thickening with small amount adjacent fluid.  Findings compatible with colitis, favor infectious although ischemic cannot be excluded.   Original Report Authenticated By: Kevin Dover, M.D.    Microbiology: No results found for this or any previous visit (from the past 240 hour(s)).   Labs: Basic Metabolic Panel:  Recent Labs Lab 07/24/12 1900 07/25/12 1310 07/26/12 0604  NA 141 140 139  K 4.0 3.0* 3.2*  CL 94* 104 104  CO2 24 29 29  GLUCOSE 194* 114* 72  BUN 33* 29* 19  CREATININE 1.63* 1.28* 1.10  CALCIUM  10.5 7.8* 8.0*   Liver Function Tests:  Recent Labs Lab 07/24/12 1900  AST 40*  ALT 34  ALKPHOS 112  BILITOT 0.7  PROT 8.6*  ALBUMIN 4.4   No results found for this basename: LIPASE, AMYLASE,  in the last 168 hours No results found for this basename: AMMONIA,  in the last 168 hours CBC:  Recent Labs Lab 07/24/12 1900 07/26/12 1129  WBC 21.5* 9.0  NEUTROABS 18.7*  --   HGB 17.0*   12.1  HCT 48.6* 36.8  MCV 85.0 89.3  PLT 219 121*   Cardiac Enzymes: No results found for this basename: CKTOTAL, CKMB, CKMBINDEX, TROPONINI,  in the last 168 hours BNP: BNP (last 3 results) No results found for this basename: PROBNP,  in the last 8760 hours CBG:  Recent Labs Lab 07/26/12 1947 07/27/12 0020 07/27/12 0404 07/27/12 0744 07/27/12 1149  GLUCAP 87 92 102* 111* 111*       Signed:  Jeffrey Voth  [facility] 07/27/2012, 11:55 AM     

## 2012-08-05 ENCOUNTER — Encounter (INDEPENDENT_AMBULATORY_CARE_PROVIDER_SITE_OTHER): Payer: Medicare Other | Admitting: Ophthalmology

## 2012-08-05 DIAGNOSIS — H43819 Vitreous degeneration, unspecified eye: Secondary | ICD-10-CM

## 2012-08-05 DIAGNOSIS — H348392 Tributary (branch) retinal vein occlusion, unspecified eye, stable: Secondary | ICD-10-CM

## 2012-08-05 DIAGNOSIS — H35039 Hypertensive retinopathy, unspecified eye: Secondary | ICD-10-CM

## 2012-08-05 DIAGNOSIS — H2510 Age-related nuclear cataract, unspecified eye: Secondary | ICD-10-CM

## 2012-08-05 DIAGNOSIS — I10 Essential (primary) hypertension: Secondary | ICD-10-CM

## 2012-08-11 ENCOUNTER — Encounter (INDEPENDENT_AMBULATORY_CARE_PROVIDER_SITE_OTHER): Payer: Medicare Other | Admitting: Ophthalmology

## 2012-08-13 ENCOUNTER — Ambulatory Visit: Payer: Medicare Other | Admitting: Internal Medicine

## 2012-08-18 ENCOUNTER — Ambulatory Visit (INDEPENDENT_AMBULATORY_CARE_PROVIDER_SITE_OTHER): Payer: Medicare Other | Admitting: Internal Medicine

## 2012-08-18 ENCOUNTER — Encounter: Payer: Self-pay | Admitting: Internal Medicine

## 2012-08-18 VITALS — BP 130/70 | HR 72 | Ht 65.0 in | Wt 108.5 lb

## 2012-08-18 DIAGNOSIS — K559 Vascular disorder of intestine, unspecified: Secondary | ICD-10-CM

## 2012-08-18 DIAGNOSIS — R1032 Left lower quadrant pain: Secondary | ICD-10-CM

## 2012-08-18 DIAGNOSIS — R933 Abnormal findings on diagnostic imaging of other parts of digestive tract: Secondary | ICD-10-CM

## 2012-08-18 DIAGNOSIS — K625 Hemorrhage of anus and rectum: Secondary | ICD-10-CM

## 2012-08-18 MED ORDER — MOVIPREP 100 G PO SOLR: 1.0000 | Freq: Once | ORAL | Status: DC

## 2012-08-18 NOTE — Progress Notes (Signed)
HISTORY OF PRESENT ILLNESS:  [name] is a 72 y.o. female with multiple significant medical problems as listed below as well as remote small bowel obstruction secondary to adhesions requiring laparotomy. She was hospitalized in 07/25/2012 with acute abdominal pain and bloody diarrhea. CT scan of the abdomen and pelvis revealed a segmental colitis involving the left colon. Initial hemoglobin was 17.0 and discharge hemoglobin 12.1. She was treated with empiric antibiotics and supportive measures. Discharge 07/29/2012. Presents for followup at this time. Patient reports that her symptoms have improved. She does have occasional mild diarrhea. No further issues with pain or bleeding. Review of her old chart shows a flexible sigmoidoscopy in 2002 with her PCP. No prior complete colonoscopy. She has had some mild weight loss. GI review of systems is otherwise negative.  REVIEW OF SYSTEMS:  All non-GI ROS negative except for fatigue, arthritis and night sweats  Past Medical History  Diagnosis Date  . THYROID NODULE, RIGHT 02/01/2009  . GLUCOSE INTOLERANCE 11/04/2007  . HYPERLIPIDEMIA 02/12/2007  . HYPOKALEMIA 11/04/2007  . ANXIETY 02/12/2007  . DEPRESSION 02/12/2007  . HYPERTENSION 11/07/2006  . CORONARY ARTERY DISEASE 11/04/2007  . CAROTID ARTERY STENOSIS, BILATERAL 02/24/2007  . Unspecified Peripheral Vascular Disease 02/12/2007  . ASTHMATIC BRONCHITIS, ACUTE 04/22/2008  . ALLERGIC RHINITIS 02/12/2007  . COPD 11/04/2007  . GERD 02/12/2007  . CONSTIPATION 02/12/2007  . DEGENERATIVE JOINT DISEASE, FINGERS 03/04/2008  . LOW BACK PAIN, CHRONIC 02/01/2009  . BACK PAIN, RIGHT 12/05/2009  . LEG PAIN, BILATERAL 02/01/2009  . OSTEOPOROSIS 02/12/2007  . DIZZINESS 02/12/2007  . INSOMNIA-SLEEP DISORDER-UNSPEC 08/04/2007  . FATIGUE 02/12/2007  . CHEST PAIN 02/12/2007  . Coronary artery disease 9/09    nonobstructive by cath  . Peripheral vascular disease 9/09    left carotid 60-80%   . Impaired glucose tolerance  05/11/2010  . Leg pain, bilateral 05/12/2010  . Syncope and collapse   . Essential hypertension, benign 06/17/2012    Past Surgical History  Procedure Laterality Date  . S/p bowel obstruction    . Abdominal hysterectomy    . Appendectomy    . Oophorectomy    . Tonsillectomy    . S/p right cea  2/09  . S/p right thyroid nodule biopsy  March 2011    negative  . Carotid endarterectomy    . Cardiac catheterization      Social History [name]  reports that she has been smoking.  She has never used smokeless tobacco. She reports that she does not drink alcohol or use illicit drugs.  family history includes Alcohol abuse in her other; Cancer in her son; Diabetes in her other; Heart attack in her other; Hypertension in her other; and Stroke in her other.  Allergies  Allergen Reactions  . Tramadol     constipation       PHYSICAL EXAMINATION: Vital signs: BP 130/70  Pulse 72  Ht 5\' 5"  (1.651 m)  Wt 108 lb 8 oz (49.215 kg)  BMI 18.06 kg/m2  Constitutional: generally well-appearing, no acute distress Psychiatric: alert and oriented x3, cooperative Eyes: extraocular movements intact, anicteric, conjunctiva pink Mouth: oral pharynx moist, no lesions Neck: supple no lymphadenopathy Cardiovascular: heart regular rate and rhythm, no murmur Lungs: clear to auscultation bilaterally Abdomen: soft, nontender, nondistended, no obvious ascites, no peritoneal signs, normal bowel sounds, no organomegaly Rectal: Deferred until colonoscopy Extremities: no lower extremity edema bilaterally Skin: no lesions on visible extremities Neuro: No focal deficits.   ASSESSMENT:  #1. Acute segmental colitis  most consistent with ischemic colitis based on clinical and radiographic features. Seems to have made complete clinical recovery #2. Colon cancer screening. Only has had flexible sigmoidoscopy 12 years ago. Appropriate candidate for routine followup. As well, can for sure no residual  abnormalities of left colon #3. Multiple medical problems. Stable  PLAN:  #1. Colonoscopy.The nature of the procedure, as well as the risks, benefits, and alternatives were carefully and thoroughly reviewed with the patient. Ample time for discussion and questions allowed. The patient understood, was satisfied, and agreed to proceed. #2. Movi prep prescribed. The patient instructed on its use #3. Educational discussion on ischemic colitis, its pathophysiology, course, and outcomes #4. Ongoing general medical care with Dr. Jonny Ruiz

## 2012-08-18 NOTE — Patient Instructions (Addendum)

## 2012-08-28 ENCOUNTER — Encounter: Payer: Self-pay | Admitting: Internal Medicine

## 2012-08-28 ENCOUNTER — Ambulatory Visit (AMBULATORY_SURGERY_CENTER): Payer: Medicare Other | Admitting: Internal Medicine

## 2012-08-28 VITALS — BP 190/91 | HR 62 | Temp 97.4°F | Resp 11 | Ht 65.0 in | Wt 108.0 lb

## 2012-08-28 DIAGNOSIS — Z1211 Encounter for screening for malignant neoplasm of colon: Secondary | ICD-10-CM

## 2012-08-28 DIAGNOSIS — D126 Benign neoplasm of colon, unspecified: Secondary | ICD-10-CM

## 2012-08-28 DIAGNOSIS — R933 Abnormal findings on diagnostic imaging of other parts of digestive tract: Secondary | ICD-10-CM

## 2012-08-28 MED ORDER — SODIUM CHLORIDE 0.9 % IV SOLN: 500.0000 mL | INTRAVENOUS | Status: DC

## 2012-08-28 NOTE — Progress Notes (Signed)
Pt is HIPPA.  I went over the discharge instructions, the findings from the exam and answered questions form the pt in the recovery room with just her in the room.  I went over discharge instructions only with her cousin, Carney Harder.  Maw  No problems or complaints noted. Maw

## 2012-08-28 NOTE — Op Note (Signed)
[facility] [address] [place], [address]   COLONOSCOPY PROCEDURE REPORT  PATIENT: [name], [name]  MR#: [id number] BIRTHDATE: 1941-01-06 , 71  yrs. old GENDER: Female ENDOSCOPIST: Roxy Cedar, MD REFERRED BY:.  Self / Office PROCEDURE DATE:  08/28/2012 PROCEDURE:   Colonoscopy with snare polypectomy x 5 First Screening Colonoscopy - Avg.  risk and is 50 yrs.  old or older Yes.  Prior Negative Screening - Now for repeat screening. N/A  History of Adenoma - Now for follow-up colonoscopy & has been > or = to 3 yrs.  N/A  Polyps Removed Today? Yes. ASA CLASS:   Class II INDICATIONS:average risk screening. MEDICATIONS: MAC sedation, administered by CRNA and propofol (Diprivan) 200mg  IV  DESCRIPTION OF PROCEDURE:   After the risks benefits and alternatives of the procedure were thoroughly explained, informed consent was obtained.  A digital rectal exam revealed no abnormalities of the rectum.   The LB [id number]  endoscope was introduced through the anus and advanced to the cecum, which was identified by both the appendix and ileocecal valve. No adverse events experienced.   The quality of the prep was adequate, using MoviPrep  The instrument was then slowly withdrawn as the colon was fully examined.    COLON FINDINGS: Five polyps ranging between 5-46mm in size were found in the ascending colon, transverse colon, sigmoid colon, and rectum.  A polypectomy was performed with a cold snare.  The resection was complete and the polyp tissue was completely retrieved.   Mild diverticulosis was noted in the sigmoid colon. The colon mucosa was otherwise normal.  Retroflexed views revealed no abnormalities. The time to cecum=5 minutes 14 seconds. Withdrawal time=15 minutes 30 seconds.  The scope was withdrawn and the procedure completed. COMPLICATIONS: There were no complications.  ENDOSCOPIC IMPRESSION: 1.   Five polyps ranging between 5-5mm in size were  found and polypectomy was performed with a cold snare 2.   Mild diverticulosis was noted in the sigmoid colon 3.   The colon mucosa was otherwise normal  RECOMMENDATIONS: 1. Repeat Colonoscopy in 3 years.   eSigned:  Roxy Cedar, MD 08/28/2012 9:14 AM   cc: Corwin Levins, MD and The Patient   PATIENT NAME:  [name], [name] MR#: [id number]

## 2012-08-28 NOTE — Progress Notes (Signed)
Called to room to assist during endoscopic procedure.  Patient ID and intended procedure confirmed with present staff. Received instructions for my participation in the procedure from the performing physician.  

## 2012-08-28 NOTE — Patient Instructions (Addendum)
YOU HAD AN ENDOSCOPIC PROCEDURE TODAY AT THE [facility]: Refer to the procedure report that was given to you for any specific questions about what was found during the examination.  If the procedure report does not answer your questions, please call your gastroenterologist to clarify.  If you requested that your care partner not be given the details of your procedure findings, then the procedure report has been included in a sealed envelope for you to review at your convenience later.  YOU SHOULD EXPECT: Some feelings of bloating in the abdomen. Passage of more gas than usual.  Walking can help get rid of the air that was put into your GI tract during the procedure and reduce the bloating. If you had a lower endoscopy (such as a colonoscopy or flexible sigmoidoscopy) you may notice spotting of blood in your stool or on the toilet paper. If you underwent a bowel prep for your procedure, then you may not have a normal bowel movement for a few days.  DIET: Your first meal following the procedure should be a light meal and then it is ok to progress to your normal diet.  A half-sandwich or bowl of soup is an example of a good first meal.  Heavy or fried foods are harder to digest and may make you feel nauseous or bloated.  Likewise meals heavy in dairy and vegetables can cause extra gas to form and this can also increase the bloating.  Drink plenty of fluids but you should avoid alcoholic beverages for 24 hours.  ACTIVITY: Your care partner should take you home directly after the procedure.  You should plan to take it easy, moving slowly for the rest of the day.  You can resume normal activity the day after the procedure however you should NOT DRIVE or use heavy machinery for 24 hours (because of the sedation medicines used during the test).    SYMPTOMS TO REPORT IMMEDIATELY: A gastroenterologist can be reached at any hour.  During normal business hours, 8:30 AM to 5:00 PM Monday through Friday,  call [phone].  After hours and on weekends, please call the GI answering service at [phone] who will take a message and have the physician on call contact you.   Following lower endoscopy (colonoscopy or flexible sigmoidoscopy):  Excessive amounts of blood in the stool  Significant tenderness or worsening of abdominal pains  Swelling of the abdomen that is new, acute  Fever of 100F or higher    FOLLOW UP: If any biopsies were taken you will be contacted by phone or by letter within the next 1-3 weeks.  Call your gastroenterologist if you have not heard about the biopsies in 3 weeks.  Our staff will call the home number listed on your records the next business day following your procedure to check on you and address any questions or concerns that you may have at that time regarding the information given to you following your procedure. This is a courtesy call and so if there is no answer at the home number and we have not heard from you through the emergency physician on call, we will assume that you have returned to your regular daily activities without incident.  SIGNATURES/CONFIDENTIALITY: You and/or your care partner have signed paperwork which will be entered into your electronic medical record.  These signatures attest to the fact that that the information above on your After Visit Summary has been reviewed and is understood.  Full responsibility of the confidentiality   of this discharge information lies with you and/or your care-partner.   Handouts were placed in a sealed envelope on polyps, diverticulosis and high fiber diet. You may resume your current medications today. Please call if any questions or concerns.

## 2012-08-29 ENCOUNTER — Telehealth: Payer: Self-pay

## 2012-08-29 NOTE — Telephone Encounter (Signed)
Attempt follow up call post procedure, no answer, no vms 

## 2012-09-02 ENCOUNTER — Encounter: Payer: Self-pay | Admitting: Internal Medicine

## 2012-09-03 ENCOUNTER — Encounter (INDEPENDENT_AMBULATORY_CARE_PROVIDER_SITE_OTHER): Payer: Medicare Other | Admitting: Ophthalmology

## 2012-09-17 ENCOUNTER — Encounter (INDEPENDENT_AMBULATORY_CARE_PROVIDER_SITE_OTHER): Payer: Medicare Other | Admitting: Ophthalmology

## 2012-09-17 DIAGNOSIS — H35039 Hypertensive retinopathy, unspecified eye: Secondary | ICD-10-CM

## 2012-09-17 DIAGNOSIS — H2510 Age-related nuclear cataract, unspecified eye: Secondary | ICD-10-CM

## 2012-09-17 DIAGNOSIS — I10 Essential (primary) hypertension: Secondary | ICD-10-CM

## 2012-09-17 DIAGNOSIS — H348392 Tributary (branch) retinal vein occlusion, unspecified eye, stable: Secondary | ICD-10-CM

## 2012-09-17 DIAGNOSIS — H43819 Vitreous degeneration, unspecified eye: Secondary | ICD-10-CM

## 2012-10-02 ENCOUNTER — Other Ambulatory Visit: Payer: Self-pay | Admitting: Internal Medicine

## 2012-10-02 NOTE — Telephone Encounter (Signed)
Done hardcopy to robin  

## 2012-10-02 NOTE — Telephone Encounter (Signed)
Faxed hardcopy to [facility] [address] [place]

## 2012-10-15 ENCOUNTER — Encounter (INDEPENDENT_AMBULATORY_CARE_PROVIDER_SITE_OTHER): Payer: Medicare Other | Admitting: Ophthalmology

## 2012-10-21 ENCOUNTER — Encounter (INDEPENDENT_AMBULATORY_CARE_PROVIDER_SITE_OTHER): Payer: Medicare Other | Admitting: Ophthalmology

## 2012-10-21 DIAGNOSIS — H43819 Vitreous degeneration, unspecified eye: Secondary | ICD-10-CM

## 2012-10-21 DIAGNOSIS — H348392 Tributary (branch) retinal vein occlusion, unspecified eye, stable: Secondary | ICD-10-CM

## 2012-10-21 DIAGNOSIS — I10 Essential (primary) hypertension: Secondary | ICD-10-CM

## 2012-10-21 DIAGNOSIS — H35039 Hypertensive retinopathy, unspecified eye: Secondary | ICD-10-CM

## 2012-11-12 ENCOUNTER — Telehealth: Payer: Self-pay | Admitting: *Deleted

## 2012-11-12 ENCOUNTER — Ambulatory Visit: Payer: Medicare Other | Admitting: Internal Medicine

## 2012-11-12 NOTE — Telephone Encounter (Signed)
[facility] Triage Call Report Triage Record Num: [id number] Operator: Claudie Leach Patient Name: [name] Call Date & Time: 11/12/2012 3:19:47AM Patient Phone: [phone] PCP: Oliver Barre Patient Gender: Female PCP Fax : [phone] Patient DOB: 01-02-1941 Practice Name: [facility] Reason for Call: Caller: Larue/Patient; PCP: Oliver Barre (Adults only); CB#: [phone]; Call regarding calling to cancel appointment for 9:15 Am on 11/12/12.; States she will call back to reschedule. Appointment cancelled in [facility] and note sent to the office. Protocol(s) Used: Office Note Recommended Outcome per Protocol: Information Noted and Sent to Office Reason for Outcome: Caller information to office Care Advice: ~ 10/

## 2012-11-18 ENCOUNTER — Encounter (INDEPENDENT_AMBULATORY_CARE_PROVIDER_SITE_OTHER): Payer: Medicare Other | Admitting: Ophthalmology

## 2012-12-05 ENCOUNTER — Inpatient Hospital Stay (HOSPITAL_COMMUNITY)
Admission: EM | Admit: 2012-12-05 | Discharge: 2012-12-07 | DRG: 682 | Disposition: A | Discharge status: Home / Self Care | Payer: Medicare Other | Attending: Family Medicine | Admitting: Family Medicine

## 2012-12-05 ENCOUNTER — Encounter (HOSPITAL_COMMUNITY): Payer: Self-pay | Admitting: Emergency Medicine

## 2012-12-05 ENCOUNTER — Emergency Department (HOSPITAL_COMMUNITY): Payer: Medicare Other

## 2012-12-05 DIAGNOSIS — N3281 Overactive bladder: Secondary | ICD-10-CM

## 2012-12-05 DIAGNOSIS — I10 Essential (primary) hypertension: Secondary | ICD-10-CM

## 2012-12-05 DIAGNOSIS — R7309 Other abnormal glucose: Secondary | ICD-10-CM | POA: Diagnosis present

## 2012-12-05 DIAGNOSIS — N390 Urinary tract infection, site not specified: Secondary | ICD-10-CM

## 2012-12-05 DIAGNOSIS — F3289 Other specified depressive episodes: Secondary | ICD-10-CM

## 2012-12-05 DIAGNOSIS — M179 Osteoarthritis of knee, unspecified: Secondary | ICD-10-CM

## 2012-12-05 DIAGNOSIS — I959 Hypotension, unspecified: Secondary | ICD-10-CM | POA: Diagnosis present

## 2012-12-05 DIAGNOSIS — R109 Unspecified abdominal pain: Secondary | ICD-10-CM

## 2012-12-05 DIAGNOSIS — F329 Major depressive disorder, single episode, unspecified: Secondary | ICD-10-CM

## 2012-12-05 DIAGNOSIS — A088 Other specified intestinal infections: Secondary | ICD-10-CM | POA: Diagnosis present

## 2012-12-05 DIAGNOSIS — M545 Low back pain, unspecified: Secondary | ICD-10-CM

## 2012-12-05 DIAGNOSIS — J309 Allergic rhinitis, unspecified: Secondary | ICD-10-CM

## 2012-12-05 DIAGNOSIS — E872 Acidosis, unspecified: Secondary | ICD-10-CM | POA: Diagnosis present

## 2012-12-05 DIAGNOSIS — E43 Unspecified severe protein-calorie malnutrition: Secondary | ICD-10-CM

## 2012-12-05 DIAGNOSIS — K219 Gastro-esophageal reflux disease without esophagitis: Secondary | ICD-10-CM

## 2012-12-05 DIAGNOSIS — R5381 Other malaise: Secondary | ICD-10-CM

## 2012-12-05 DIAGNOSIS — M19049 Primary osteoarthritis, unspecified hand: Secondary | ICD-10-CM

## 2012-12-05 DIAGNOSIS — R1030 Lower abdominal pain, unspecified: Secondary | ICD-10-CM

## 2012-12-05 DIAGNOSIS — F411 Generalized anxiety disorder: Secondary | ICD-10-CM

## 2012-12-05 DIAGNOSIS — K5900 Constipation, unspecified: Secondary | ICD-10-CM

## 2012-12-05 DIAGNOSIS — I739 Peripheral vascular disease, unspecified: Secondary | ICD-10-CM | POA: Diagnosis present

## 2012-12-05 DIAGNOSIS — E876 Hypokalemia: Secondary | ICD-10-CM

## 2012-12-05 DIAGNOSIS — Z23 Encounter for immunization: Secondary | ICD-10-CM

## 2012-12-05 DIAGNOSIS — Z0000 Encounter for general adult medical examination without abnormal findings: Secondary | ICD-10-CM

## 2012-12-05 DIAGNOSIS — R7302 Impaired glucose tolerance (oral): Secondary | ICD-10-CM

## 2012-12-05 DIAGNOSIS — Z79899 Other long term (current) drug therapy: Secondary | ICD-10-CM

## 2012-12-05 DIAGNOSIS — M810 Age-related osteoporosis without current pathological fracture: Secondary | ICD-10-CM

## 2012-12-05 DIAGNOSIS — R112 Nausea with vomiting, unspecified: Secondary | ICD-10-CM

## 2012-12-05 DIAGNOSIS — R197 Diarrhea, unspecified: Secondary | ICD-10-CM

## 2012-12-05 DIAGNOSIS — I6529 Occlusion and stenosis of unspecified carotid artery: Secondary | ICD-10-CM | POA: Diagnosis present

## 2012-12-05 DIAGNOSIS — J4489 Other specified chronic obstructive pulmonary disease: Secondary | ICD-10-CM

## 2012-12-05 DIAGNOSIS — Z681 Body mass index (BMI) 19 or less, adult: Secondary | ICD-10-CM

## 2012-12-05 DIAGNOSIS — E860 Dehydration: Secondary | ICD-10-CM

## 2012-12-05 DIAGNOSIS — D72829 Elevated white blood cell count, unspecified: Secondary | ICD-10-CM

## 2012-12-05 DIAGNOSIS — J449 Chronic obstructive pulmonary disease, unspecified: Secondary | ICD-10-CM

## 2012-12-05 DIAGNOSIS — E785 Hyperlipidemia, unspecified: Secondary | ICD-10-CM

## 2012-12-05 DIAGNOSIS — N179 Acute kidney failure, unspecified: Principal | ICD-10-CM

## 2012-12-05 DIAGNOSIS — R55 Syncope and collapse: Secondary | ICD-10-CM

## 2012-12-05 DIAGNOSIS — I2510 Atherosclerotic heart disease of native coronary artery without angina pectoris: Secondary | ICD-10-CM

## 2012-12-05 DIAGNOSIS — F17200 Nicotine dependence, unspecified, uncomplicated: Secondary | ICD-10-CM

## 2012-12-05 DIAGNOSIS — I658 Occlusion and stenosis of other precerebral arteries: Secondary | ICD-10-CM | POA: Diagnosis present

## 2012-12-05 DIAGNOSIS — E041 Nontoxic single thyroid nodule: Secondary | ICD-10-CM

## 2012-12-05 DIAGNOSIS — M1710 Unilateral primary osteoarthritis, unspecified knee: Secondary | ICD-10-CM

## 2012-12-05 DIAGNOSIS — M6530 Trigger finger, unspecified finger: Secondary | ICD-10-CM

## 2012-12-05 DIAGNOSIS — G4700 Insomnia, unspecified: Secondary | ICD-10-CM

## 2012-12-05 DIAGNOSIS — M79604 Pain in right leg: Secondary | ICD-10-CM

## 2012-12-05 LAB — URINALYSIS, ROUTINE W REFLEX MICROSCOPIC
Glucose, UA: 100 mg/dL — AB
Ketones, ur: NEGATIVE mg/dL
Nitrite: NEGATIVE
Protein, ur: >300 mg/dL — AB
Specific Gravity, Urine: 1.032 — ABNORMAL HIGH (ref 1.005–1.030)
Urobilinogen, UA: 0.2 mg/dL (ref 0.0–1.0)

## 2012-12-05 LAB — CREATININE, SERUM
Creatinine, Ser: 1.97 mg/dL — ABNORMAL HIGH (ref 0.50–1.10)
GFR calc Af Amer: 28 mL/min — ABNORMAL LOW (ref >90)
GFR calc non Af Amer: 24 mL/min — ABNORMAL LOW (ref >90)

## 2012-12-05 LAB — POCT I-STAT TROPONIN I: Troponin i, poc: 0.07 ng/mL (ref 0.00–0.08)

## 2012-12-05 LAB — CBC WITH DIFFERENTIAL/PLATELET
Basophils Absolute: 0 10*3/uL (ref 0.0–0.1)
Basophils Relative: 0 % (ref 0–1)
Eosinophils Absolute: 0 10*3/uL (ref 0.0–0.7)
HCT: 53.9 % — ABNORMAL HIGH (ref 36.0–46.0)
Hemoglobin: 19.1 g/dL — ABNORMAL HIGH (ref 12.0–15.0)
Lymphs Abs: 0.8 10*3/uL (ref 0.7–4.0)
MCH: 30.8 pg (ref 26.0–34.0)
MCHC: 35.4 g/dL (ref 30.0–36.0)
Monocytes Absolute: 1 10*3/uL (ref 0.1–1.0)
Monocytes Relative: 5 % (ref 3–12)
RDW: 14.3 % (ref 11.5–15.5)

## 2012-12-05 LAB — HEPATIC FUNCTION PANEL
AST: 30 U/L (ref 0–37)
Bilirubin, Direct: 0.1 mg/dL (ref 0.0–0.3)
Indirect Bilirubin: 0.3 mg/dL (ref 0.3–0.9)
Total Bilirubin: 0.4 mg/dL (ref 0.3–1.2)

## 2012-12-05 LAB — POCT I-STAT, CHEM 8
Calcium, Ion: 1 mmol/L — ABNORMAL LOW (ref 1.13–1.30)
Creatinine, Ser: 2.4 mg/dL — ABNORMAL HIGH (ref 0.50–1.10)
Glucose, Bld: 229 mg/dL — ABNORMAL HIGH (ref 70–99)
Hemoglobin: 20.7 g/dL — ABNORMAL HIGH (ref 12.0–15.0)
Potassium: 3.5 mEq/L (ref 3.5–5.1)
TCO2: 21 mmol/L (ref 0–100)

## 2012-12-05 LAB — LACTIC ACID, PLASMA: Lactic Acid, Venous: 2.4 mmol/L — ABNORMAL HIGH (ref 0.5–2.2)

## 2012-12-05 LAB — LIPASE, BLOOD: Lipase: 36 U/L (ref 11–59)

## 2012-12-05 LAB — URINE MICROSCOPIC-ADD ON

## 2012-12-05 LAB — CG4 I-STAT (LACTIC ACID): Lactic Acid, Venous: 6.2 mmol/L — ABNORMAL HIGH (ref 0.5–2.2)

## 2012-12-05 MED ORDER — ONDANSETRON HCL 4 MG PO TABS: 4.0000 mg | ORAL_TABLET | Freq: Four times a day (QID) | ORAL | Status: DC | PRN

## 2012-12-05 MED ORDER — FENTANYL CITRATE 0.05 MG/ML IJ SOLN
50.0000 ug | Freq: Once | INTRAMUSCULAR | Status: AC
  Administered 2012-12-05: 50 ug via INTRAVENOUS
  Filled 2012-12-05: qty 2

## 2012-12-05 MED ORDER — DEXTROSE 5 % IV SOLN
1.0000 g | INTRAVENOUS | Status: DC
  Administered 2012-12-06 – 2012-12-07 (×2): 1 g via INTRAVENOUS
  Filled 2012-12-05 (×2): qty 10

## 2012-12-05 MED ORDER — ENSURE COMPLETE PO LIQD
237.0000 mL | Freq: Two times a day (BID) | ORAL | Status: DC
  Administered 2012-12-05 – 2012-12-07 (×3): 237 mL via ORAL

## 2012-12-05 MED ORDER — CHLORTHALIDONE 25 MG PO TABS
25.0000 mg | ORAL_TABLET | Freq: Every day | ORAL | Status: DC
  Administered 2012-12-05: 07:00:00 25 mg via ORAL
  Filled 2012-12-05: qty 1

## 2012-12-05 MED ORDER — LORATADINE 10 MG PO TABS
10.0000 mg | ORAL_TABLET | Freq: Every day | ORAL | Status: DC
  Administered 2012-12-05 – 2012-12-07 (×3): 10 mg via ORAL
  Filled 2012-12-05 (×4): qty 1

## 2012-12-05 MED ORDER — SODIUM CHLORIDE 0.9 % IV SOLN
INTRAVENOUS | Status: AC
  Administered 2012-12-05: 07:00:00 via INTRAVENOUS

## 2012-12-05 MED ORDER — SODIUM CHLORIDE 0.9 % IJ SOLN
3.0000 mL | Freq: Two times a day (BID) | INTRAMUSCULAR | Status: DC
  Administered 2012-12-06: 3 mL via INTRAVENOUS

## 2012-12-05 MED ORDER — ATENOLOL 50 MG PO TABS
50.0000 mg | ORAL_TABLET | Freq: Every day | ORAL | Status: DC
  Administered 2012-12-05 – 2012-12-07 (×3): 50 mg via ORAL
  Filled 2012-12-05 (×3): qty 1

## 2012-12-05 MED ORDER — SODIUM CHLORIDE 0.9 % IV BOLUS (SEPSIS)
1000.0000 mL | Freq: Once | INTRAVENOUS | Status: AC
  Administered 2012-12-05: 1000 mL via INTRAVENOUS

## 2012-12-05 MED ORDER — BOOST / RESOURCE BREEZE PO LIQD
1.0000 | ORAL | Status: DC
  Administered 2012-12-06 – 2012-12-07 (×2): 1 via ORAL

## 2012-12-05 MED ORDER — DEXTROSE 5 % IV SOLN
1.0000 g | Freq: Once | INTRAVENOUS | Status: AC
  Administered 2012-12-05: 1 g via INTRAVENOUS
  Filled 2012-12-05: qty 10

## 2012-12-05 MED ORDER — MORPHINE SULFATE 4 MG/ML IJ SOLN
4.0000 mg | Freq: Once | INTRAMUSCULAR | Status: AC
  Administered 2012-12-05: 4 mg via INTRAVENOUS
  Filled 2012-12-05: qty 1

## 2012-12-05 MED ORDER — ONDANSETRON HCL 4 MG/2ML IJ SOLN
4.0000 mg | Freq: Four times a day (QID) | INTRAMUSCULAR | Status: DC | PRN
  Administered 2012-12-05: 4 mg via INTRAVENOUS
  Filled 2012-12-05: qty 2

## 2012-12-05 MED ORDER — SODIUM CHLORIDE 0.9 % IV BOLUS (SEPSIS)
500.0000 mL | Freq: Once | INTRAVENOUS | Status: AC
  Administered 2012-12-05: 500 mL via INTRAVENOUS

## 2012-12-05 MED ORDER — ENOXAPARIN SODIUM 30 MG/0.3ML ~~LOC~~ SOLN
30.0000 mg | SUBCUTANEOUS | Status: DC
  Administered 2012-12-05 – 2012-12-07 (×3): 30 mg via SUBCUTANEOUS
  Filled 2012-12-05 (×4): qty 0.3

## 2012-12-05 MED ORDER — MORPHINE SULFATE 2 MG/ML IJ SOLN
1.0000 mg | INTRAMUSCULAR | Status: DC | PRN
  Administered 2012-12-05 (×2): 1 mg via INTRAVENOUS
  Filled 2012-12-05 (×2): qty 1

## 2012-12-05 MED ORDER — ATENOLOL-CHLORTHALIDONE 50-25 MG PO TABS: 1.0000 | ORAL_TABLET | Freq: Every day | ORAL | Status: DC

## 2012-12-05 MED ORDER — HYDRALAZINE HCL 20 MG/ML IJ SOLN
10.0000 mg | INTRAMUSCULAR | Status: DC | PRN
  Filled 2012-12-05: qty 0.5

## 2012-12-05 MED ORDER — ONDANSETRON HCL 4 MG/2ML IJ SOLN
4.0000 mg | Freq: Once | INTRAMUSCULAR | Status: AC
  Administered 2012-12-05: 4 mg via INTRAVENOUS
  Filled 2012-12-05: qty 2

## 2012-12-05 MED ORDER — CLONIDINE HCL 0.2 MG PO TABS
0.2000 mg | ORAL_TABLET | Freq: Two times a day (BID) | ORAL | Status: DC
  Administered 2012-12-05 – 2012-12-07 (×5): 0.2 mg via ORAL
  Filled 2012-12-05 (×6): qty 1

## 2012-12-05 NOTE — ED Notes (Signed)
Patient back from CT.

## 2012-12-05 NOTE — H&P (Signed)
PCP:   Oliver Barre, MD   Chief Complaint:  n/v  HPI: 72 yo female with 2 days of n/v nonbloody and worsening suprapubic abd pain.  Increased urinary freq.  No hematuria or dysuria.  Has not been able to hold down her bp meds.  No cp.  No sob.  No le edema or swelling.  No fevers.  Does not have freq utis.  Feels better with ivf, morphine, zofran and rocephin in ED.  No vomiting since arrived to ED.  Review of Systems:  Positive and negative as per HPI otherwise all other systems are negative  Past Medical History: Past Medical History  Diagnosis Date  . THYROID NODULE, RIGHT 02/01/2009  . GLUCOSE INTOLERANCE 11/04/2007  . HYPERLIPIDEMIA 02/12/2007  . HYPOKALEMIA 11/04/2007  . ANXIETY 02/12/2007  . DEPRESSION 02/12/2007  . HYPERTENSION 11/07/2006  . CORONARY ARTERY DISEASE 11/04/2007  . CAROTID ARTERY STENOSIS, BILATERAL 02/24/2007  . Unspecified Peripheral Vascular Disease 02/12/2007  . ASTHMATIC BRONCHITIS, ACUTE 04/22/2008  . ALLERGIC RHINITIS 02/12/2007  . COPD 11/04/2007  . GERD 02/12/2007  . CONSTIPATION 02/12/2007  . DEGENERATIVE JOINT DISEASE, FINGERS 03/04/2008  . LOW BACK PAIN, CHRONIC 02/01/2009  . BACK PAIN, RIGHT 12/05/2009  . LEG PAIN, BILATERAL 02/01/2009  . OSTEOPOROSIS 02/12/2007  . DIZZINESS 02/12/2007  . INSOMNIA-SLEEP DISORDER-UNSPEC 08/04/2007  . FATIGUE 02/12/2007  . CHEST PAIN 02/12/2007  . Coronary artery disease 9/09    nonobstructive by cath  . Peripheral vascular disease 9/09    left carotid 60-80%   . Impaired glucose tolerance 05/11/2010  . Leg pain, bilateral 05/12/2010  . Syncope and collapse   . Essential hypertension, benign 06/17/2012   Past Surgical History  Procedure Laterality Date  . S/p bowel obstruction    . Abdominal hysterectomy    . Appendectomy    . Oophorectomy    . Tonsillectomy    . S/p right cea  2/09  . S/p right thyroid nodule biopsy  March 2011    negative  . Carotid endarterectomy    . Cardiac catheterization      Medications: Prior  to Admission medications   Medication Sig Start Date End Date Taking? Authorizing Provider  acetaminophen-codeine (TYLENOL #3) 300-30 MG per tablet TAKE 1 TABLET BY MOUTH EVERY 6 HOURS AS NEEDED FOR PAIN 10/02/12   Corwin Levins, MD  alendronate (FOSAMAX) 70 MG tablet Take 70 mg by mouth every 7 (seven) days. Take with a full glass of water on an empty stomach.    Historical Provider, MD  aspirin 81 MG tablet Take 81 mg by mouth daily.    Historical Provider, MD  atenolol-chlorthalidone (TENORETIC) 50-25 MG per tablet Take 1 tablet by mouth daily. 06/17/12   Corwin Levins, MD  cholecalciferol (VITAMIN D) 1000 UNITS tablet Take 1,000 Units by mouth daily.     Historical Provider, MD  cloNIDine (CATAPRES) 0.2 MG tablet Take 1 tablet (0.2 mg total) by mouth 2 (two) times daily. 06/17/12   Corwin Levins, MD  lisinopril (PRINIVIL,ZESTRIL) 20 MG tablet Take 1 tablet (20 mg total) by mouth daily. 07/09/12   Corwin Levins, MD  oxybutynin (DITROPAN-XL) 10 MG 24 hr tablet Take 1 tablet (10 mg total) by mouth daily. 06/17/12   Corwin Levins, MD  simvastatin (ZOCOR) 40 MG tablet TAKE 1 TABLET (40 MG TOTAL) BY MOUTH DAILY. 06/17/12   Corwin Levins, MD  zolpidem (AMBIEN) 5 MG tablet Take 1 tablet (5 mg total) by mouth at bedtime  as needed for sleep. 06/17/12   Corwin Levins, MD    Allergies:   Allergies  Allergen Reactions  . Tramadol     constipation    Social History:  reports that she has been smoking.  She has never used smokeless tobacco. She reports that she drinks alcohol. She reports that she does not use illicit drugs.  Family History: Family History  Problem Relation Age of Onset  . Cancer Son     Possible colon cancer  . Heart attack Other   . Stroke Other   . Alcohol abuse Other   . Diabetes Other   . Hypertension Other     Physical Exam: Filed Vitals:   12/05/12 0346 12/05/12 0402 12/05/12 0505 12/05/12 0512  BP: 204/113 190/144 153/91   Pulse: 104 111 103   Temp:    98.1 F (36.7 C)   TempSrc:    Oral  Resp: 17 20 13    Height:      Weight:      SpO2: 98% 99% 97%    General appearance: alert, cooperative and no distress Head: Normocephalic, without obvious abnormality, atraumatic Eyes: negative Nose: Nares normal. Septum midline. Mucosa normal. No drainage or sinus tenderness. Neck: no JVD and supple, symmetrical, trachea midline Lungs: clear to auscultation bilaterally Heart: regular rate and rhythm, S1, S2 normal, no murmur, click, rub or gallop Abdomen: soft, non-tender; bowel sounds normal; no masses,  no organomegaly  Mild suprapubic tenderness Extremities: extremities normal, atraumatic, no cyanosis or edema Pulses: 2+ and symmetric Skin: Skin color, texture, turgor normal. No rashes or lesions Neurologic: Grossly normal    Labs on Admission:   Recent Labs  12/05/12 0348  NA 142  K 3.5  CL 104  GLUCOSE 229*  BUN 34*  CREATININE 2.40*    Recent Labs  12/05/12 0341 12/05/12 0348  WBC 18.5*  --   NEUTROABS 16.7*  --   HGB 19.1* 20.7*  HCT 53.9* 61.0*  MCV 86.9  --   PLT 280  --     Radiological Exams on Admission: Ct Abdomen Pelvis Wo Contrast  12/05/2012   CLINICAL DATA:  Nausea, vomiting, chills in constipation.  EXAM: CT ABDOMEN AND PELVIS WITHOUT CONTRAST  TECHNIQUE: Multidetector CT imaging of the abdomen and pelvis was performed following the standard protocol without intravenous contrast.  COMPARISON:  CT of the abdomen and pelvis 07/24/2012.  FINDINGS: Lung Bases: Small amount of subsegmental atelectasis or scarring in the lower lobes of the lungs bilaterally.  Abdomen/Pelvis: Small amount of intermediate attenuation material layering dependently in the gallbladder, compatible with biliary sludge. No current findings to suggest acute cholecystitis at this time. The unenhanced appearance of the liver, pancreas, spleen, bilateral adrenal glands and the left kidney is unremarkable. 2 mm calcification in the lower pole the right kidney may  represent a vascular calcification or a nonobstructive calculus. Severe atherosclerosis. No significant volume of ascites. No pneumoperitoneum. No pathologic distention of small bowel. No definite lymphadenopathy identified within the abdomen or pelvis on today's non contrast CT examination. Status post hysterectomy. Ovaries are not confidently identified and are likely surgically absent. Urinary bladder is nearly completely decompressed, but otherwise unremarkable in appearance.  Musculoskeletal: There are no aggressive appearing lytic or blastic lesions noted in the visualized portions of the skeleton.  IMPRESSION: 1. No acute findings in the abdomen or pelvis to account for the patient's symptoms. 2. Small amount of biliary sludge in the gallbladder. No current findings to suggest acute cholecystitis  at this time. 3. 2 mm calcification in the lower pole collecting system of the right kidney likely represents a nonobstructive calculus. This may alternatively represent a vascular calcification. 4. Extensive atherosclerosis.   Electronically Signed   By: Trudie Reed M.D.   On: 12/05/2012 05:06   Dg Abd Acute W/chest  12/05/2012   CLINICAL DATA:  Lower abdominal pain. Constipation.  EXAM: ACUTE ABDOMEN SERIES (ABDOMEN 2 VIEW & CHEST 1 VIEW)  COMPARISON:  Acute abdominal series 03/26/2009.  FINDINGS: Lungs appear hyperexpanded and hyperlucent, suggesting underlying emphysema. No consolidative airspace disease. No pleural effusions. No evidence of pulmonary edema. Heart size is normal. Mediastinal contours are unremarkable.  Supine and left lateral decubitus views of the abdomen demonstrate a paucity of bowel gas. There is some colonic gas and likely some distal rectal gas. No pathologic distention of small bowel. No pneumoperitoneum. Multiple vascular calcifications are noted.  IMPRESSION: 1. Nonobstructive bowel gas pattern. 2. No pneumoperitoneum. 3. No radiographic evidence of acute cardiopulmonary disease.  The appearance of the lungs suggests underlying emphysema.   Electronically Signed   By: Trudie Reed M.D.   On: 12/05/2012 04:13    Assessment/Plan  72 yo female with n/v/suprapubic abd pain for 2 days now with uti and acute renal failure from dehydration/infection  Principal Problem:   Acute renal failure- probably multifactorial from dehydration and infection.  Has received 1.5 liters of ivf.  Good uop.  Cont fluids through the day.  Hold ace inhibitor.  Active Problems:   CORONARY ARTERY DISEASE stable   Impaired glucose tolerance stable   Essential hypertension, benign give her home bp meds now.  Prn hydralazine ordered.   Suprapubic pain, acute from uti   UTI (lower urinary tract infection)  Give rocephin   Nausea & vomiting will also add lfts/lipase/ and trop to make sure no other reason for her n/v, but probably due to her uti and worsening renal failure.    Yaileen Hofferber A 12/05/2012, 5:29 AM

## 2012-12-05 NOTE — ED Notes (Signed)
MD at bedside. 

## 2012-12-05 NOTE — ED Notes (Signed)
Report given to Toniann Fail, RN  All questions answered  Patient stable and in NAD upon leaving ED

## 2012-12-05 NOTE — ED Notes (Signed)
Notified RN,Emily, pt. CG4 i-stat Lactic acid 6.20. RN, Emily made EDP, Palumbo,MD. aware. I-stat troponin 0.07.

## 2012-12-05 NOTE — ED Notes (Signed)
Patient medicated for nausea and pain, see MAR Patient taken to radiology via stretcher Patient in NAD upon leaving room for testing

## 2012-12-05 NOTE — ED Notes (Signed)
Patient medicated for c/o 8-9/10 lower abdominal/pelvic pain, see MAR NS bolus and Rocephin IVPB given, see MAR Patient and patient's son informed of consult to hospitalist for admission--agree and v/u Patient and pt's son deny further needs at this time Side rails up, call bell in reach

## 2012-12-05 NOTE — ED Notes (Signed)
Patient placed on cardiac monitor--noted to be in ST ED tech and phlebotomy at bedside to collect specimens as ordered

## 2012-12-05 NOTE — Progress Notes (Signed)
Pt complaining of runny nose, congested cough, and eyes tearing. Requesting "med".  MD please assess.

## 2012-12-05 NOTE — ED Provider Notes (Addendum)
CSN: [id number]     Arrival date & time 12/05/12  0309 History   First MD Initiated Contact with Patient 12/05/12 [phone]     Chief Complaint  Patient presents with  . Nausea  . Emesis   (Consider location/radiation/quality/duration/timing/severity/associated sxs/prior Treatment) Patient is a 72 y.o. female presenting with vomiting. The history is provided by the patient and a relative. History limited by: not answering questions.  Emesis Severity: dry heaves with chills for one day. Progression:  Unchanged Chronicity:  New Recent urination:  Normal Relieved by:  Nothing Worsened by:  Nothing tried Ineffective treatments:  None tried Associated symptoms: abdominal pain   Associated symptoms comment:  Constipation pain started while patient was on the commode Abdominal pain:    Location:  Suprapubic   Quality:  Dull   Severity:  Severe   Onset quality:  Sudden (While attempting use use the restroom this evening)   Timing:  Constant   Progression:  Unchanged   Chronicity:  New Risk factors: prior abdominal surgery     Past Medical History  Diagnosis Date  . THYROID NODULE, RIGHT 02/01/2009  . GLUCOSE INTOLERANCE 11/04/2007  . HYPERLIPIDEMIA 02/12/2007  . HYPOKALEMIA 11/04/2007  . ANXIETY 02/12/2007  . DEPRESSION 02/12/2007  . HYPERTENSION 11/07/2006  . CORONARY ARTERY DISEASE 11/04/2007  . CAROTID ARTERY STENOSIS, BILATERAL 02/24/2007  . Unspecified Peripheral Vascular Disease 02/12/2007  . ASTHMATIC BRONCHITIS, ACUTE 04/22/2008  . ALLERGIC RHINITIS 02/12/2007  . COPD 11/04/2007  . GERD 02/12/2007  . CONSTIPATION 02/12/2007  . DEGENERATIVE JOINT DISEASE, FINGERS 03/04/2008  . LOW BACK PAIN, CHRONIC 02/01/2009  . BACK PAIN, RIGHT 12/05/2009  . LEG PAIN, BILATERAL 02/01/2009  . OSTEOPOROSIS 02/12/2007  . DIZZINESS 02/12/2007  . INSOMNIA-SLEEP DISORDER-UNSPEC 08/04/2007  . FATIGUE 02/12/2007  . CHEST PAIN 02/12/2007  . Coronary artery disease 9/09    nonobstructive by cath  . Peripheral  vascular disease 9/09    left carotid 60-80%   . Impaired glucose tolerance 05/11/2010  . Leg pain, bilateral 05/12/2010  . Syncope and collapse   . Essential hypertension, benign 06/17/2012   Past Surgical History  Procedure Laterality Date  . S/p bowel obstruction    . Abdominal hysterectomy    . Appendectomy    . Oophorectomy    . Tonsillectomy    . S/p right cea  2/09  . S/p right thyroid nodule biopsy  March 2011    negative  . Carotid endarterectomy    . Cardiac catheterization     Family History  Problem Relation Age of Onset  . Cancer Son     Possible colon cancer  . Heart attack Other   . Stroke Other   . Alcohol abuse Other   . Diabetes Other   . Hypertension Other    History  Substance Use Topics  . Smoking status: Current Every Day Smoker -- 0.50 packs/day  . Smokeless tobacco: Never Used  . Alcohol Use: Yes     Comment: beer occasionally   OB History   Grav Para Term Preterm Abortions TAB SAB Ect Mult Living                 Review of Systems  Gastrointestinal: Positive for nausea, vomiting, abdominal pain and constipation.  Genitourinary: Negative for dysuria.  All other systems reviewed and are negative.    Allergies  Tramadol  Home Medications   Current Outpatient Rx  Name  Route  Sig  Dispense  Refill  . acetaminophen-codeine (TYLENOL #3) 300-30  MG per tablet      TAKE 1 TABLET BY MOUTH EVERY 6 HOURS AS NEEDED FOR PAIN   60 tablet   1   . alendronate (FOSAMAX) 70 MG tablet   Oral   Take 70 mg by mouth every 7 (seven) days. Take with a full glass of water on an empty stomach.         Marland Kitchen aspirin 81 MG tablet   Oral   Take 81 mg by mouth daily.         Marland Kitchen atenolol-chlorthalidone (TENORETIC) 50-25 MG per tablet   Oral   Take 1 tablet by mouth daily.   90 tablet   3   . cholecalciferol (VITAMIN D) 1000 UNITS tablet   Oral   Take 1,000 Units by mouth daily.          . cloNIDine (CATAPRES) 0.2 MG tablet   Oral   Take 1 tablet  (0.2 mg total) by mouth 2 (two) times daily.   180 tablet   3   . lisinopril (PRINIVIL,ZESTRIL) 20 MG tablet   Oral   Take 1 tablet (20 mg total) by mouth daily.   90 tablet   3   . oxybutynin (DITROPAN-XL) 10 MG 24 hr tablet   Oral   Take 1 tablet (10 mg total) by mouth daily.   90 tablet   3   . simvastatin (ZOCOR) 40 MG tablet      TAKE 1 TABLET (40 MG TOTAL) BY MOUTH DAILY.   90 tablet   3   . zolpidem (AMBIEN) 5 MG tablet   Oral   Take 1 tablet (5 mg total) by mouth at bedtime as needed for sleep.   30 tablet   5    BP 202/113  Pulse 110  Temp(Src) 98 F (36.7 C) (Oral)  Resp 18  Ht 5\' 6"  (1.676 m)  Wt 110 lb (49.896 kg)  BMI 17.76 kg/m2  SpO2 98% Physical Exam  Constitutional: She appears well-developed and well-nourished. No distress.  HENT:  Head: Normocephalic and atraumatic.  Mouth/Throat: Oropharynx is clear and moist.  Eyes: Conjunctivae are normal. Pupils are equal, round, and reactive to light.  Neck: Normal range of motion. Neck supple.  Cardiovascular: Normal rate, regular rhythm and intact distal pulses.   Pulmonary/Chest: Effort normal and breath sounds normal. She has no wheezes. She has no rales.  Abdominal: She exhibits no mass. Bowel sounds are increased. There is no rebound and no guarding.  Suprapubic tenderness  Musculoskeletal: Normal range of motion. She exhibits no edema.  Neurological: She is alert.  Skin: Skin is warm and dry.  Psychiatric: Her mood appears anxious.    ED Course  Procedures (including critical care time) Labs Review Labs Reviewed  CBC WITH DIFFERENTIAL  URINALYSIS, ROUTINE W REFLEX MICROSCOPIC   Imaging Review No results found.  EKG Interpretation   None       MDM  No diagnosis found. MDM Reviewed: previous chart, nursing note and vitals Reviewed previous: labs Interpretation: labs and ECG (leukocytosis acute renal failure. UTI and anion gap of 15) Total time providing critical care: 30-74  minutes. This excludes time spent performing separately reportable procedures and services. Consults: admitting MD  DDX: Diabetes new onset-- unclear if the acidosis is related to glucose UTI Acute renal failure Dehydration and ARF secondary to same Hypertension likely secondary to pain  Medications  cefTRIAXone (ROCEPHIN) 1 g in dextrose 5 % 50 mL IVPB (1 g Intravenous New Bag/Given  12/05/12 0506)  fentaNYL (SUBLIMAZE) injection 50 mcg (50 mcg Intravenous Given 12/05/12 0346)  ondansetron (ZOFRAN) injection 4 mg (4 mg Intravenous Given 12/05/12 0345)  sodium chloride 0.9 % bolus 500 mL (0 mLs Intravenous Stopped 12/05/12 0446)  sodium chloride 0.9 % bolus 1,000 mL (1,000 mLs Intravenous New Bag/Given 12/05/12 0506)  morphine 4 MG/ML injection 4 mg (4 mg Intravenous Given 12/05/12 0506)   Results for orders placed during the hospital encounter of 12/05/12  CBC WITH DIFFERENTIAL      Result Value Range   WBC 18.5 (*) 4.0 - 10.5 K/uL   RBC 6.20 (*) 3.87 - 5.11 MIL/uL   Hemoglobin 19.1 (*) 12.0 - 15.0 g/dL   HCT 16.1 (*) 09.6 - 04.5 %   MCV 86.9  78.0 - 100.0 fL   MCH 30.8  26.0 - 34.0 pg   MCHC 35.4  30.0 - 36.0 g/dL   RDW 40.9  81.1 - 91.4 %   Platelets 280  150 - 400 K/uL   Neutrophils Relative % 91 (*) 43 - 77 %   Neutro Abs 16.7 (*) 1.7 - 7.7 K/uL   Lymphocytes Relative 4 (*) 12 - 46 %   Lymphs Abs 0.8  0.7 - 4.0 K/uL   Monocytes Relative 5  3 - 12 %   Monocytes Absolute 1.0  0.1 - 1.0 K/uL   Eosinophils Relative 0  0 - 5 %   Eosinophils Absolute 0.0  0.0 - 0.7 K/uL   Basophils Relative 0  0 - 1 %   Basophils Absolute 0.0  0.0 - 0.1 K/uL  URINALYSIS, ROUTINE W REFLEX MICROSCOPIC      Result Value Range   Color, Urine AMBER (*) YELLOW   APPearance TURBID (*) CLEAR   Specific Gravity, Urine 1.032 (*) 1.005 - 1.030   pH 5.0  5.0 - 8.0   Glucose, UA 100 (*) NEGATIVE mg/dL   Hgb urine dipstick MODERATE (*) NEGATIVE   Bilirubin Urine SMALL (*) NEGATIVE   Ketones, ur NEGATIVE   NEGATIVE mg/dL   Protein, ur >782 (*) NEGATIVE mg/dL   Urobilinogen, UA 0.2  0.0 - 1.0 mg/dL   Nitrite NEGATIVE  NEGATIVE   Leukocytes, UA SMALL (*) NEGATIVE  URINE MICROSCOPIC-ADD ON      Result Value Range   Squamous Epithelial / LPF FEW (*) RARE   WBC, UA 3-6  <3 WBC/hpf   RBC / HPF 3-6  <3 RBC/hpf   Bacteria, UA MANY (*) RARE   Urine-Other AMORPHOUS URATES/PHOSPHATES    POCT I-STAT, CHEM 8      Result Value Range   Sodium 142  135 - 145 mEq/L   Potassium 3.5  3.5 - 5.1 mEq/L   Chloride 104  96 - 112 mEq/L   BUN 34 (*) 6 - 23 mg/dL   Creatinine, Ser 9.56 (*) 0.50 - 1.10 mg/dL   Glucose, Bld 213 (*) 70 - 99 mg/dL   Calcium, Ion 0.86 (*) 1.13 - 1.30 mmol/L   TCO2 21  0 - 100 mmol/L   Hemoglobin 20.7 (*) 12.0 - 15.0 g/dL   HCT 57.8 (*) 46.9 - 62.9 %   Ct Abdomen Pelvis Wo Contrast  12/05/2012   CLINICAL DATA:  Nausea, vomiting, chills in constipation.  EXAM: CT ABDOMEN AND PELVIS WITHOUT CONTRAST  TECHNIQUE: Multidetector CT imaging of the abdomen and pelvis was performed following the standard protocol without intravenous contrast.  COMPARISON:  CT of the abdomen and pelvis 07/24/2012.  FINDINGS: Lung Bases:  Small amount of subsegmental atelectasis or scarring in the lower lobes of the lungs bilaterally.  Abdomen/Pelvis: Small amount of intermediate attenuation material layering dependently in the gallbladder, compatible with biliary sludge. No current findings to suggest acute cholecystitis at this time. The unenhanced appearance of the liver, pancreas, spleen, bilateral adrenal glands and the left kidney is unremarkable. 2 mm calcification in the lower pole the right kidney may represent a vascular calcification or a nonobstructive calculus. Severe atherosclerosis. No significant volume of ascites. No pneumoperitoneum. No pathologic distention of small bowel. No definite lymphadenopathy identified within the abdomen or pelvis on today's non contrast CT examination. Status post  hysterectomy. Ovaries are not confidently identified and are likely surgically absent. Urinary bladder is nearly completely decompressed, but otherwise unremarkable in appearance.  Musculoskeletal: There are no aggressive appearing lytic or blastic lesions noted in the visualized portions of the skeleton.  IMPRESSION: 1. No acute findings in the abdomen or pelvis to account for the patient's symptoms. 2. Small amount of biliary sludge in the gallbladder. No current findings to suggest acute cholecystitis at this time. 3. 2 mm calcification in the lower pole collecting system of the right kidney likely represents a nonobstructive calculus. This may alternatively represent a vascular calcification. 4. Extensive atherosclerosis.   Electronically Signed   By: Trudie Reed M.D.   On: 12/05/2012 05:06   Dg Abd Acute W/chest  12/05/2012   CLINICAL DATA:  Lower abdominal pain. Constipation.  EXAM: ACUTE ABDOMEN SERIES (ABDOMEN 2 VIEW & CHEST 1 VIEW)  COMPARISON:  Acute abdominal series 03/26/2009.  FINDINGS: Lungs appear hyperexpanded and hyperlucent, suggesting underlying emphysema. No consolidative airspace disease. No pleural effusions. No evidence of pulmonary edema. Heart size is normal. Mediastinal contours are unremarkable.  Supine and left lateral decubitus views of the abdomen demonstrate a paucity of bowel gas. There is some colonic gas and likely some distal rectal gas. No pathologic distention of small bowel. No pneumoperitoneum. Multiple vascular calcifications are noted.  IMPRESSION: 1. Nonobstructive bowel gas pattern. 2. No pneumoperitoneum. 3. No radiographic evidence of acute cardiopulmonary disease. The appearance of the lungs suggests underlying emphysema.   Electronically Signed   By: Trudie Reed M.D.   On: 12/05/2012 04:13      CRITICAL CARE Performed by: Jasmine Awe Total critical care time: 31 minutes Critical care time was exclusive of separately billable procedures and  treating other patients. Critical care was necessary to treat or prevent imminent or life-threatening deterioration. Critical care was time spent personally by me on the following activities: development of treatment plan with patient and/or surrogate as well as nursing, discussions with consultants, evaluation of patient's response to treatment, examination of patient, obtaining history from patient or surrogate, ordering and performing treatments and interventions, ordering and review of laboratory studies, ordering and review of radiographic studies, pulse oximetry and re-evaluation of patient's condition.   Date: 12/05/2012  Rate: 97  Rhythm: normal sinus rhythm  QRS Axis: normal  Intervals: QT prolonged  ST/T Wave abnormalities: normal  Conduction Disutrbances:none  Narrative Interpretation:   Old EKG Reviewed: none available   Lactic acid elevated.  Troponin is negative at this time.  Dr. Onalee Hua called for update [phone]   Lindel Marcell Smitty Cords, MD 12/05/12 [phone]

## 2012-12-05 NOTE — Progress Notes (Signed)
INITIAL NUTRITION ASSESSMENT  DOCUMENTATION CODES Per approved criteria  -Severe malnutrition in the context of chronic illness -Underweight  Pt meets criteria for SEVERE MALNUTRITION in the context of Chronic Illness as evidenced by 14% wt loss in less than 6 months, evidence of severe muscle wasting in physical exam, and estimated energy intake <75% estimated energy needs for >1 month.  INTERVENTION: Provide Ensure Complete BID Provide Resource Breeze once daily Encourage PO intake RD to continue to monitor  NUTRITION DIAGNOSIS: Inadequate oral intake related to poor appetite as evidenced by 14% wt loss in less than 6 months.   Goal: Pt to meet >/= 90% of their estimated nutrition needs  Monitor:  PO intake Diet advancement Weight Labs  Reason for Assessment: Malnutrition Screening Tool, score of 5  72 y.o. female  Admitting Dx: Acute renal failure  ASSESSMENT: 72 yo female with 2 days of n/v nonbloody and worsening suprapubic abd pain. Increased urinary freq. Pt has history of hyperlipidemia, hypertension, COPD, GERD, and glucose intolerance. Pt reports that nausea and vomiting have resolved; eating very little today due to abdominal pain. Pt reports having a poor appetite PTA; she eats breakfast and dinner and drinks Ensure supplements twice daily. She states she has been told to drink Ensure 3 times daily but, she can only handle two daily. Pt states her usual body weight a few years ago was 130 to 140 lbs; she weighed about 115 lbs back in June. Weight history shows 14% weight loss since mid May 2014. Encouraged pt to incorporate more snacks in the middle of the day and to continue use of Ensure supplements. Discussed protein-rich foods and encouraged daily intake.   Nutrition Focused Physical Exam:  Subcutaneous Fat:  Orbital Region: mild wasting Upper Arm Region: moderate wasting Thoracic and Lumbar Region: NA  Muscle:  Temple Region: mild wasting Clavicle Bone  Region: severe wasting Clavicle and Acromion Bone Region: moderate wasting Scapular Bone Region: NA Dorsal Hand: moderate wasting Patellar Region: moderate wasting Anterior Thigh Region: moderate/severe wasting Posterior Calf Region: moderate wasting  Edema: none  Height: Ht Readings from Last 1 Encounters:  12/05/12 5' 2.5" (1.588 m)    Weight: Wt Readings from Last 1 Encounters:  12/05/12 99 lb 3.3 oz (45 kg)    Ideal Body Weight: 112 lbs  % Ideal Body Weight: 88%  Wt Readings from Last 10 Encounters:  12/05/12 99 lb 3.3 oz (45 kg)  08/28/12 108 lb (48.988 kg)  08/18/12 108 lb 8 oz (49.215 kg)  07/25/12 97 lb 10.6 oz (44.3 kg)  07/09/12 98 lb 6 oz (44.623 kg)  06/17/12 116 lb (52.617 kg)  05/14/12 112 lb 2 oz (50.86 kg)  06/20/11 116 lb (52.617 kg)  06/14/11 116 lb (52.617 kg)  05/11/11 120 lb 6 oz (54.602 kg)    Usual Body Weight: 115 lbs  % Usual Body Weight: 86%  BMI:  Body mass index is 17.84 kg/(m^2).  Estimated Nutritional Needs: Kcal: 1400-1600 Protein: 68-78 grams Fluid: 1.3-1.6 L/day  Skin: WDL  Diet Order: Full Liquid  EDUCATION NEEDS: -No education needs identified at this time   Intake/Output Summary (Last 24 hours) at 12/05/12 1458 Last data filed at 12/05/12 0900  Gross per 24 hour  Intake    195 ml  Output      0 ml  Net    195 ml    Last BM: 11/4  Labs:   Recent Labs Lab 12/05/12 0341 12/05/12 0348  NA  --  142  K  --  3.5  CL  --  104  BUN  --  34*  CREATININE 1.97* 2.40*  GLUCOSE  --  229*    CBG (last 3)  No results found for this basename: GLUCAP,  in the last 72 hours  Scheduled Meds: . atenolol  50 mg Oral Daily  . cefTRIAXone (ROCEPHIN)  IV  1 g Intravenous Q24H  . cloNIDine  0.2 mg Oral BID  . enoxaparin (LOVENOX) injection  30 mg Subcutaneous Q24H  . loratadine  10 mg Oral Daily  . sodium chloride  3 mL Intravenous Q12H    Continuous Infusions: . sodium chloride 100 mL/hr at 12/05/12 1213     Past Medical History  Diagnosis Date  . THYROID NODULE, RIGHT 02/01/2009  . GLUCOSE INTOLERANCE 11/04/2007  . HYPERLIPIDEMIA 02/12/2007  . HYPOKALEMIA 11/04/2007  . ANXIETY 02/12/2007  . DEPRESSION 02/12/2007  . HYPERTENSION 11/07/2006  . CORONARY ARTERY DISEASE 11/04/2007  . CAROTID ARTERY STENOSIS, BILATERAL 02/24/2007  . Unspecified Peripheral Vascular Disease 02/12/2007  . ASTHMATIC BRONCHITIS, ACUTE 04/22/2008  . ALLERGIC RHINITIS 02/12/2007  . COPD 11/04/2007  . GERD 02/12/2007  . CONSTIPATION 02/12/2007  . DEGENERATIVE JOINT DISEASE, FINGERS 03/04/2008  . LOW BACK PAIN, CHRONIC 02/01/2009  . BACK PAIN, RIGHT 12/05/2009  . LEG PAIN, BILATERAL 02/01/2009  . OSTEOPOROSIS 02/12/2007  . DIZZINESS 02/12/2007  . INSOMNIA-SLEEP DISORDER-UNSPEC 08/04/2007  . FATIGUE 02/12/2007  . CHEST PAIN 02/12/2007  . Coronary artery disease 9/09    nonobstructive by cath  . Peripheral vascular disease 9/09    left carotid 60-80%   . Impaired glucose tolerance 05/11/2010  . Leg pain, bilateral 05/12/2010  . Syncope and collapse   . Essential hypertension, benign 06/17/2012    Past Surgical History  Procedure Laterality Date  . S/p bowel obstruction    . Abdominal hysterectomy    . Appendectomy    . Oophorectomy    . Tonsillectomy    . S/p right cea  2/09  . S/p right thyroid nodule biopsy  March 2011    negative  . Carotid endarterectomy    . Cardiac catheterization      Ian Malkin RD, LDN Inpatient Clinical Dietitian Pager: [phone] After Hours Pager: [phone]

## 2012-12-05 NOTE — ED Notes (Signed)
Patient arrives to ED via EMS Per EMS: patient with c/o flu like symptoms, N/V, chills x 1 day During assessment, patient would not answer my questions--pt's son provided information PIV established in left wrist by EMS

## 2012-12-05 NOTE — ED Notes (Signed)
Patient taken to CT via stretcher Patient in NAD upon leaving

## 2012-12-05 NOTE — ED Notes (Signed)
Bed: AO13 Expected date:  Expected time:  Means of arrival:  Comments: EMS 72yo N/V

## 2012-12-05 NOTE — Progress Notes (Signed)
Subjective: Patient seen and examined, admitted this morning with nausea, vomiting and worsening suprapubic pain. Filed Vitals:   12/05/12 1500  BP: 117/80  Pulse:   Temp: 98.4 F (36.9 C)  Resp: 18    Chest: Clear Bilaterally Heart : S1S2 RRR Abdomen: Soft, nontender Ext : No edema Neuro: Alert, oriented x 3  A/P UTI AKI Hypertension Lactic acidosis  Patient admitted with dehydration, AKI and has lactic acidosis likely due to dehydration. Will repeat lactic acid level. Patient'd CT abdomen does not show significant abnormality. Will check BMP in am. Continue Ceftriaxone for UTI.   Meredeth Ide [facility] [phone]

## 2012-12-05 NOTE — ED Notes (Signed)
Patient back from radiology Patient asking for water--informed of NPO status Patient states that previously given pain medication is beginning to provide relief r/t lower abdominal pain Patient placed back on monitor--remains in ST and hypertensive, MD is aware  NS given, see MAR Warm blankets provided Patient and pt's son deny any further needs at this time

## 2012-12-06 ENCOUNTER — Inpatient Hospital Stay (HOSPITAL_COMMUNITY): Payer: Medicare Other

## 2012-12-06 DIAGNOSIS — E43 Unspecified severe protein-calorie malnutrition: Secondary | ICD-10-CM | POA: Insufficient documentation

## 2012-12-06 LAB — URINE CULTURE
Colony Count: NO GROWTH
Culture: NO GROWTH

## 2012-12-06 LAB — BASIC METABOLIC PANEL
BUN: 39 mg/dL — ABNORMAL HIGH (ref 6–23)
CO2: 27 mEq/L (ref 19–32)
Calcium: 8.6 mg/dL (ref 8.4–10.5)
Creatinine, Ser: 1.47 mg/dL — ABNORMAL HIGH (ref 0.50–1.10)
GFR calc non Af Amer: 35 mL/min — ABNORMAL LOW (ref >90)
Glucose, Bld: 92 mg/dL (ref 70–99)
Sodium: 137 mEq/L (ref 135–145)

## 2012-12-06 LAB — CBC
MCH: 29.7 pg (ref 26.0–34.0)
MCHC: 32.4 g/dL (ref 30.0–36.0)
RDW: 15.3 % (ref 11.5–15.5)
WBC: 10.4 10*3/uL (ref 4.0–10.5)

## 2012-12-06 MED ORDER — ZOLPIDEM TARTRATE 5 MG PO TABS
5.0000 mg | ORAL_TABLET | Freq: Every evening | ORAL | Status: DC | PRN
  Administered 2012-12-06: 5 mg via ORAL
  Filled 2012-12-06: qty 1

## 2012-12-06 MED ORDER — SALINE SPRAY 0.65 % NA SOLN
1.0000 | NASAL | Status: DC | PRN
  Administered 2012-12-06: 11:00:00 1 via NASAL
  Filled 2012-12-06: qty 44

## 2012-12-06 MED ORDER — SALINE SPRAY 0.65 % NA SOLN: 1.0000 | NASAL | Status: DC | PRN

## 2012-12-06 MED ORDER — OXYCODONE HCL 5 MG PO TABS
5.0000 mg | ORAL_TABLET | Freq: Once | ORAL | Status: AC
  Administered 2012-12-06: 5 mg via ORAL
  Filled 2012-12-06: qty 1

## 2012-12-06 MED ORDER — BENZONATATE 100 MG PO CAPS
100.0000 mg | ORAL_CAPSULE | Freq: Two times a day (BID) | ORAL | Status: DC
  Administered 2012-12-06 – 2012-12-07 (×3): 100 mg via ORAL
  Filled 2012-12-06 (×4): qty 1

## 2012-12-06 MED ORDER — BISACODYL 10 MG RE SUPP
10.0000 mg | Freq: Once | RECTAL | Status: AC
  Administered 2012-12-07: 10 mg via RECTAL
  Filled 2012-12-06: qty 1

## 2012-12-06 NOTE — Progress Notes (Signed)
[facility] PROGRESS NOTE  [name] ZOX:[id number] DOB: [birth year] DOA: 12/05/2012 PCP: Oliver Barre, MD  Assessment/Plan: 1. UTI- Patient has been started on IV rocephin. Urine culture has been obtained and the result is pending at this time. 2. Nausea, vomiting- Resolved, continue with prn zofran. Lipase and LFT's are normal. 3. AKI- patient came with AKI secondary to dehydration, and her renal function is improving with the IV fluids. 4. Hypertension- continue catapres, atenolol. BP is stable. 5. Lactic acidosis- Resolved, CT abdomen was normal, likley due to hypoperfusion from hypotension and dehydration. 6. Cough- patient complaining of cough, will give tessalon pearls and also obtain CXR to r/o bronchitis vs pneumonia. 7. DVT Prophylaxis- Lovenox.  Code Status: Full code Family Communication: Discussed with patient in detail Disposition Plan: Home when stable   Consultants:  None*  Procedures:  None  Antibiotics:  ceftriaxone  HPI/Subjective: Patient admitted with nausea, vomiting and worsening suprapubic pain.Her renal function has improved with IV fluids.  Objective: Filed Vitals:   12/06/12 0449  BP: 135/59  Pulse: 53  Temp: 98.3 F (36.8 C)  Resp: 18    Intake/Output Summary (Last 24 hours) at 12/06/12 1140 Last data filed at 12/05/12 1900  Gross per 24 hour  Intake    240 ml  Output      0 ml  Net    240 ml   Filed Weights   12/05/12 0316 12/05/12 0630 12/06/12 0449  Weight: 49.896 kg (110 lb) 45 kg (99 lb 3.3 oz) 47.2 kg (104 lb 0.9 oz)    Exam:  Physical Exam: Head: Normocephalic, atraumatic.  Eyes: No signs of jaundice, EOMI Nose: Mucous membranes dry.  Throat: Oropharynx nonerythematous, no exudate appreciated.  Neck: supple,No deformities, masses, or tenderness noted. Lungs: Normal respiratory effort. B/L Clear to auscultation, no crackles or wheezes.  Heart: Regular RR. S1 and S2 normal  Abdomen: BS normoactive. Soft,  Nondistended, non-tender.  Extremities: No pretibial edema, no erythema   Data Reviewed: Basic Metabolic Panel:  Recent Labs Lab 12/05/12 0341 12/05/12 0348 12/06/12 0526  NA  --  142 137  K  --  3.5 3.3*  CL  --  104 101  CO2  --   --  27  GLUCOSE  --  229* 92  BUN  --  34* 39*  CREATININE 1.97* 2.40* 1.47*  CALCIUM  --   --  8.6   Liver Function Tests:  Recent Labs Lab 12/05/12 0341  AST 30  ALT 25  ALKPHOS 72  BILITOT 0.4  PROT 9.6*  ALBUMIN 4.7    Recent Labs Lab 12/05/12 0341  LIPASE 36   No results found for this basename: AMMONIA,  in the last 168 hours CBC:  Recent Labs Lab 12/05/12 0341 12/05/12 0348 12/06/12 0526  WBC 18.5*  --  10.4  NEUTROABS 16.7*  --   --   HGB 19.1* 20.7* 14.1  HCT 53.9* 61.0* 43.5  MCV 86.9  --  91.6  PLT 280  --  183   Cardiac Enzymes: No results found for this basename: CKTOTAL, CKMB, CKMBINDEX, TROPONINI,  in the last 168 hours BNP (last 3 results) No results found for this basename: PROBNP,  in the last 8760 hours CBG: No results found for this basename: GLUCAP,  in the last 168 hours  Recent Results (from the past 240 hour(s))  URINE CULTURE     Status: None   Collection Time    12/05/12  3:37 AM  Result Value Range Status   Specimen Description URINE, CATHETERIZED   Final   Special Requests NONE   Final   Culture  Setup Time     Final   Value: 12/05/2012 11:06     Performed at [facility] Count     Final   Value: NO GROWTH     Performed at [facility]   Culture     Final   Value: NO GROWTH     Performed at [facility]   Report Status 12/06/2012 FINAL   Final     Studies: Ct Abdomen Pelvis Wo Contrast  12/05/2012   CLINICAL DATA:  Nausea, vomiting, chills in constipation.  EXAM: CT ABDOMEN AND PELVIS WITHOUT CONTRAST  TECHNIQUE: Multidetector CT imaging of the abdomen and pelvis was performed following the standard protocol without intravenous contrast.   COMPARISON:  CT of the abdomen and pelvis 07/24/2012.  FINDINGS: Lung Bases: Small amount of subsegmental atelectasis or scarring in the lower lobes of the lungs bilaterally.  Abdomen/Pelvis: Small amount of intermediate attenuation material layering dependently in the gallbladder, compatible with biliary sludge. No current findings to suggest acute cholecystitis at this time. The unenhanced appearance of the liver, pancreas, spleen, bilateral adrenal glands and the left kidney is unremarkable. 2 mm calcification in the lower pole the right kidney may represent a vascular calcification or a nonobstructive calculus. Severe atherosclerosis. No significant volume of ascites. No pneumoperitoneum. No pathologic distention of small bowel. No definite lymphadenopathy identified within the abdomen or pelvis on today's non contrast CT examination. Status post hysterectomy. Ovaries are not confidently identified and are likely surgically absent. Urinary bladder is nearly completely decompressed, but otherwise unremarkable in appearance.  Musculoskeletal: There are no aggressive appearing lytic or blastic lesions noted in the visualized portions of the skeleton.  IMPRESSION: 1. No acute findings in the abdomen or pelvis to account for the patient's symptoms. 2. Small amount of biliary sludge in the gallbladder. No current findings to suggest acute cholecystitis at this time. 3. 2 mm calcification in the lower pole collecting system of the right kidney likely represents a nonobstructive calculus. This may alternatively represent a vascular calcification. 4. Extensive atherosclerosis.   Electronically Signed   By: Trudie Reed M.D.   On: 12/05/2012 05:06   Dg Abd Acute W/chest  12/05/2012   CLINICAL DATA:  Lower abdominal pain. Constipation.  EXAM: ACUTE ABDOMEN SERIES (ABDOMEN 2 VIEW & CHEST 1 VIEW)  COMPARISON:  Acute abdominal series 03/26/2009.  FINDINGS: Lungs appear hyperexpanded and hyperlucent, suggesting  underlying emphysema. No consolidative airspace disease. No pleural effusions. No evidence of pulmonary edema. Heart size is normal. Mediastinal contours are unremarkable.  Supine and left lateral decubitus views of the abdomen demonstrate a paucity of bowel gas. There is some colonic gas and likely some distal rectal gas. No pathologic distention of small bowel. No pneumoperitoneum. Multiple vascular calcifications are noted.  IMPRESSION: 1. Nonobstructive bowel gas pattern. 2. No pneumoperitoneum. 3. No radiographic evidence of acute cardiopulmonary disease. The appearance of the lungs suggests underlying emphysema.   Electronically Signed   By: Trudie Reed M.D.   On: 12/05/2012 04:13    Scheduled Meds: . atenolol  50 mg Oral Daily  . benzonatate  100 mg Oral BID  . bisacodyl  10 mg Rectal Once  . cefTRIAXone (ROCEPHIN)  IV  1 g Intravenous Q24H  . cloNIDine  0.2 mg Oral BID  . enoxaparin (LOVENOX) injection  30 mg  Subcutaneous Q24H  . feeding supplement (ENSURE COMPLETE)  237 mL Oral BID BM  . feeding supplement (RESOURCE BREEZE)  1 Container Oral Q24H  . loratadine  10 mg Oral Daily  . sodium chloride  3 mL Intravenous Q12H   Continuous Infusions:   Principal Problem:   Acute renal failure Active Problems:   CORONARY ARTERY DISEASE   Impaired glucose tolerance   Essential hypertension, benign   Suprapubic pain, acute   UTI (lower urinary tract infection)   Nausea & vomiting   Protein-calorie malnutrition, severe    Time spent: 25 min    [facility] S  [facility] Pager [phone]. If 7PM-7AM, please contact night-coverage at [email or web address], password [facility] 12/06/2012, 11:40 AM  LOS: 1 day

## 2012-12-07 LAB — BASIC METABOLIC PANEL
Calcium: 9.5 mg/dL (ref 8.4–10.5)
Creatinine, Ser: 1.16 mg/dL — ABNORMAL HIGH (ref 0.50–1.10)
GFR calc Af Amer: 54 mL/min — ABNORMAL LOW (ref >90)
GFR calc non Af Amer: 46 mL/min — ABNORMAL LOW (ref >90)
Glucose, Bld: 111 mg/dL — ABNORMAL HIGH (ref 70–99)
Sodium: 141 mEq/L (ref 135–145)

## 2012-12-07 MED ORDER — ATENOLOL 50 MG PO TABS: 50.0000 mg | ORAL_TABLET | Freq: Every day | ORAL | Status: DC

## 2012-12-07 MED ORDER — BOOST / RESOURCE BREEZE PO LIQD: 1.0000 | ORAL | Status: DC

## 2012-12-07 MED ORDER — ENSURE COMPLETE PO LIQD: 237.0000 mL | Freq: Two times a day (BID) | ORAL | Status: DC

## 2012-12-07 NOTE — Discharge Summary (Addendum)
Physician Discharge Summary  [name] ZOX:[id number] DOB: 17-Feb-1940 DOA: 12/05/2012  PCP: Oliver Barre, MD  Admit date: 12/05/2012 Discharge date: 12/07/2012  Time spent: 50* minutes  Recommendations for Outpatient Follow-up:  1. *Follow up PCP in 2 weeks  Discharge Diagnoses:  Principal Problem:   Acute renal failure Active Problems:   CORONARY ARTERY DISEASE   Impaired glucose tolerance   Essential hypertension, benign   Suprapubic pain, acute   UTI (lower urinary tract infection)   Nausea & vomiting   Protein-calorie malnutrition, severe   Discharge Condition: *Stable  Diet recommendation: *Low salt diet  Filed Weights   12/05/12 0630 12/06/12 0449 12/07/12 0413  Weight: 45 kg (99 lb 3.3 oz) 47.2 kg (104 lb 0.9 oz) 47.5 kg (104 lb 11.5 oz)    History of present illness:  72 yo female with 2 days of n/v nonbloody and worsening suprapubic abd pain. Increased urinary freq. No hematuria or dysuria. Has not been able to hold down her bp meds. No cp. No sob. No le edema or swelling. No fevers. Does not have freq utis. Feels better with ivf, morphine, zofran and rocephin in ED. No vomiting since arrived to ED.   Hospital Course:  ??UTI- Patient was  started on IV rocephin. Urine culture came back negative. Will not discharge on antibiotics at this time.  Nausea, vomiting- Resolved, . Lipase and LFT's are normal. Likely viral gastroenteritis. AKI- patient came with AKI secondary to dehydration, and her renal function has improved with IV fluids.  Hypertension- continue catapres, atenolol and lisinopril, will discontinue Chlorthalidone due to AKI  Lactic acidosis- Resolved, CT abdomen was normal, likley due to hypoperfusion from hypotension and dehydration.  Protein calorie malnutrition- patient was seen by [facility], and here are the recommendations: Pt meets criteria for SEVERE MALNUTRITION in the context of Chronic Illness as evidenced by 14% wt loss in less than 6 months,  evidence of severe muscle wasting in physical exam, and estimated energy intake <75% estimated energy needs for >1 month.   INTERVENTION:  Provide Ensure Complete BID  Provide Resource Breeze once daily  Encourage PO intake  RD to continue to monitor Will send home on ensure BID and Resource breeze.   Procedures:  None  Consultations:  None  Discharge Exam: Filed Vitals:   12/07/12 0413  BP: 151/85  Pulse: 62  Temp: 97.5 F (36.4 C)  Resp: 18   Physical Exam: Head: Normocephalic, atraumatic.  Eyes: No signs of jaundice, EOMI Nose: Mucous membranes dry.  Throat: Oropharynx nonerythematous, no exudate appreciated.  Neck: supple,No deformities, masses, or tenderness noted. Lungs: Normal respiratory effort. B/L Clear to auscultation, no crackles or wheezes.  Heart: Regular RR. S1 and S2 normal  Abdomen: BS normoactive. Soft, Nondistended, non-tender.  Extremities: No pretibial edema, no erythema   Discharge Instructions  Discharge Orders   Future Orders Complete By Expires   Diet - low sodium heart healthy  As directed    Increase activity slowly  As directed        Medication List    STOP taking these medications       atenolol-chlorthalidone 50-25 MG per tablet  Commonly known as:  TENORETIC      TAKE these medications       acetaminophen-codeine 300-30 MG per tablet  Commonly known as:  TYLENOL #3  TAKE 1 TABLET BY MOUTH EVERY 6 HOURS AS NEEDED FOR PAIN     alendronate 70 MG tablet  Commonly known as:  FOSAMAX  Take 70 mg by mouth every 7 (seven) days. Take with a full glass of water on an empty stomach.     aspirin 81 MG tablet  Take 81 mg by mouth daily.     atenolol 50 MG tablet  Commonly known as:  TENORMIN  Take 1 tablet (50 mg total) by mouth daily.     cholecalciferol 1000 UNITS tablet  Commonly known as:  VITAMIN D  Take 1,000 Units by mouth daily.     cloNIDine 0.2 MG tablet  Commonly known as:  CATAPRES  Take 1 tablet (0.2 mg  total) by mouth 2 (two) times daily.     feeding supplement (RESOURCE BREEZE) Liqd  Take 1 Container by mouth daily.     feeding supplement (ENSURE COMPLETE) Liqd  Take 237 mLs by mouth 2 (two) times daily between meals.     lisinopril 20 MG tablet  Commonly known as:  PRINIVIL,ZESTRIL  Take 1 tablet (20 mg total) by mouth daily.     oxybutynin 10 MG 24 hr tablet  Commonly known as:  DITROPAN-XL  Take 1 tablet (10 mg total) by mouth daily.     simvastatin 40 MG tablet  Commonly known as:  ZOCOR  TAKE 1 TABLET (40 MG TOTAL) BY MOUTH DAILY.     zolpidem 5 MG tablet  Commonly known as:  AMBIEN  Take 1 tablet (5 mg total) by mouth at bedtime as needed for sleep.       Allergies  Allergen Reactions  . Tramadol     constipation      The results of significant diagnostics from this hospitalization (including imaging, microbiology, ancillary and laboratory) are listed below for reference.    Significant Diagnostic Studies: Ct Abdomen Pelvis Wo Contrast  12/05/2012   CLINICAL DATA:  Nausea, vomiting, chills in constipation.  EXAM: CT ABDOMEN AND PELVIS WITHOUT CONTRAST  TECHNIQUE: Multidetector CT imaging of the abdomen and pelvis was performed following the standard protocol without intravenous contrast.  COMPARISON:  CT of the abdomen and pelvis 07/24/2012.  FINDINGS: Lung Bases: Small amount of subsegmental atelectasis or scarring in the lower lobes of the lungs bilaterally.  Abdomen/Pelvis: Small amount of intermediate attenuation material layering dependently in the gallbladder, compatible with biliary sludge. No current findings to suggest acute cholecystitis at this time. The unenhanced appearance of the liver, pancreas, spleen, bilateral adrenal glands and the left kidney is unremarkable. 2 mm calcification in the lower pole the right kidney may represent a vascular calcification or a nonobstructive calculus. Severe atherosclerosis. No significant volume of ascites. No  pneumoperitoneum. No pathologic distention of small bowel. No definite lymphadenopathy identified within the abdomen or pelvis on today's non contrast CT examination. Status post hysterectomy. Ovaries are not confidently identified and are likely surgically absent. Urinary bladder is nearly completely decompressed, but otherwise unremarkable in appearance.  Musculoskeletal: There are no aggressive appearing lytic or blastic lesions noted in the visualized portions of the skeleton.  IMPRESSION: 1. No acute findings in the abdomen or pelvis to account for the patient's symptoms. 2. Small amount of biliary sludge in the gallbladder. No current findings to suggest acute cholecystitis at this time. 3. 2 mm calcification in the lower pole collecting system of the right kidney likely represents a nonobstructive calculus. This may alternatively represent a vascular calcification. 4. Extensive atherosclerosis.   Electronically Signed   By: Trudie Reed M.D.   On: 12/05/2012 05:06   Dg Chest 2 View  12/06/2012   CLINICAL DATA:  Cough and congestion  EXAM: CHEST  2 VIEW  COMPARISON:  None.  FINDINGS: The heart size and mediastinal contours are within normal limits. Both lungs are clear. The visualized skeletal structures are unremarkable.  IMPRESSION: No active cardiopulmonary disease.   Electronically Signed   By: Genevive Bi M.D.   On: 12/06/2012 12:09   Dg Abd Acute W/chest  12/05/2012   CLINICAL DATA:  Lower abdominal pain. Constipation.  EXAM: ACUTE ABDOMEN SERIES (ABDOMEN 2 VIEW & CHEST 1 VIEW)  COMPARISON:  Acute abdominal series 03/26/2009.  FINDINGS: Lungs appear hyperexpanded and hyperlucent, suggesting underlying emphysema. No consolidative airspace disease. No pleural effusions. No evidence of pulmonary edema. Heart size is normal. Mediastinal contours are unremarkable.  Supine and left lateral decubitus views of the abdomen demonstrate a paucity of bowel gas. There is some colonic gas and likely  some distal rectal gas. No pathologic distention of small bowel. No pneumoperitoneum. Multiple vascular calcifications are noted.  IMPRESSION: 1. Nonobstructive bowel gas pattern. 2. No pneumoperitoneum. 3. No radiographic evidence of acute cardiopulmonary disease. The appearance of the lungs suggests underlying emphysema.   Electronically Signed   By: Trudie Reed M.D.   On: 12/05/2012 04:13    Microbiology: Recent Results (from the past 240 hour(s))  URINE CULTURE     Status: None   Collection Time    12/05/12  3:37 AM      Result Value Range Status   Specimen Description URINE, CATHETERIZED   Final   Special Requests NONE   Final   Culture  Setup Time     Final   Value: 12/05/2012 11:06     Performed at [facility] Count     Final   Value: NO GROWTH     Performed at [facility]   Culture     Final   Value: NO GROWTH     Performed at [facility]   Report Status 12/06/2012 FINAL   Final     Labs: Basic Metabolic Panel:  Recent Labs Lab 12/05/12 0341 12/05/12 0348 12/06/12 0526 12/07/12 0923  NA  --  142 137 141  K  --  3.5 3.3* 3.6  CL  --  104 101 104  CO2  --   --  27 30  GLUCOSE  --  229* 92 111*  BUN  --  34* 39* 26*  CREATININE 1.97* 2.40* 1.47* 1.16*  CALCIUM  --   --  8.6 9.5   Liver Function Tests:  Recent Labs Lab 12/05/12 0341  AST 30  ALT 25  ALKPHOS 72  BILITOT 0.4  PROT 9.6*  ALBUMIN 4.7    Recent Labs Lab 12/05/12 0341  LIPASE 36   No results found for this basename: AMMONIA,  in the last 168 hours CBC:  Recent Labs Lab 12/05/12 0341 12/05/12 0348 12/06/12 0526  WBC 18.5*  --  10.4  NEUTROABS 16.7*  --   --   HGB 19.1* 20.7* 14.1  HCT 53.9* 61.0* 43.5  MCV 86.9  --  91.6  PLT 280  --  183   Cardiac Enzymes: No results found for this basename: CKTOTAL, CKMB, CKMBINDEX, TROPONINI,  in the last 168 hours BNP: BNP (last 3 results) No results found for this basename: PROBNP,  in the last  8760 hours CBG: No results found for this basename: GLUCAP,  in the last 168 hours     Signed:  Merrie Epler S  [facility] 12/07/2012, 12:59  PM

## 2012-12-08 ENCOUNTER — Telehealth: Payer: Self-pay | Admitting: *Deleted

## 2012-12-08 MED ORDER — BENZONATATE 100 MG PO CAPS: ORAL_CAPSULE | ORAL | Status: DC

## 2012-12-08 NOTE — Telephone Encounter (Signed)
Pt called states Atenolol was prescribed at discharge, it was to be for her cough.  In her discharge summary it states Tesalon Pearls was to be prescribed.  Please advise

## 2012-12-08 NOTE — Telephone Encounter (Signed)
Tessalon perle done erx

## 2012-12-09 NOTE — Telephone Encounter (Signed)
Pt called states she has not had a bowel movement since her hospital admission.  She is requesting an Rx sent to her pharmacy.  Please advise

## 2012-12-09 NOTE — Telephone Encounter (Signed)
Spoke with pt advised of MDs message 

## 2012-12-09 NOTE — Telephone Encounter (Signed)
Please start OTC Miralax daily, as this often helps, and can take a Dulcolox 1 po or pr x 1 now as well

## 2012-12-24 ENCOUNTER — Ambulatory Visit: Payer: Medicare Other | Admitting: Internal Medicine

## 2013-01-06 ENCOUNTER — Encounter: Payer: Self-pay | Admitting: Internal Medicine

## 2013-01-06 ENCOUNTER — Ambulatory Visit (INDEPENDENT_AMBULATORY_CARE_PROVIDER_SITE_OTHER): Payer: Medicare Other | Admitting: Internal Medicine

## 2013-01-06 VITALS — BP 160/84 | HR 73 | Temp 98.1°F | Ht 64.0 in | Wt 113.1 lb

## 2013-01-06 DIAGNOSIS — E785 Hyperlipidemia, unspecified: Secondary | ICD-10-CM

## 2013-01-06 DIAGNOSIS — Z23 Encounter for immunization: Secondary | ICD-10-CM

## 2013-01-06 DIAGNOSIS — M79604 Pain in right leg: Secondary | ICD-10-CM

## 2013-01-06 DIAGNOSIS — M79609 Pain in unspecified limb: Secondary | ICD-10-CM

## 2013-01-06 DIAGNOSIS — F17200 Nicotine dependence, unspecified, uncomplicated: Secondary | ICD-10-CM

## 2013-01-06 DIAGNOSIS — R7302 Impaired glucose tolerance (oral): Secondary | ICD-10-CM

## 2013-01-06 DIAGNOSIS — R7309 Other abnormal glucose: Secondary | ICD-10-CM

## 2013-01-06 NOTE — Assessment & Plan Note (Signed)
stable overall by history and exam, recent data reviewed with pt, and pt to continue medical treatment as before,  to f/u any worsening symptoms or concerns Lab Results  Component Value Date   HGBA1C 5.5 07/09/2012   Has had good a1c recent with wt loss

## 2013-01-06 NOTE — Assessment & Plan Note (Signed)
?   Claudication, for LE dopplers

## 2013-01-06 NOTE — Progress Notes (Signed)
Subjective:    Patient ID: [name], female    DOB: [birth year], 72 y.o.   MRN: [id number]  HPI  Here to f/u after recent ? Viral infection, volume depletion, diarrhea and AKD, requiring hospn recent for IVF's with renal fxn improvement. Still some occas non prod cough, cxr neg for aug 8. No fever post hospn, or n/v, cp, sob, abd pain, diarrhea .  Still smoking - 1/2 ppd now, trying to cut down, long hx of tobacco since 72yo. Former Camera operator.   Pt denies polydipsia, polyuria, Pt states overall good compliance with meds, trying to follow lower cholesterol, diabetic diet, wt overall stable.  Due for flu shot today.  Asks for f/u labs today for renal fxn and sugar. Due for mammogram - has declines so far.  Overall good compliance with treatment, and good medicine tolerability, including her BP pills, BP at home < 140/90.  Has had bilat LE pain, ? Worse with ambulation in the past few months. No prior LE dopplers. No worsening LBP or radicular symptoms. Has known carotid disease. Denies worsening depressive symptoms, suicidal ideation, or panic Past Medical History  Diagnosis Date  . THYROID NODULE, RIGHT 02/01/2009  . GLUCOSE INTOLERANCE 11/04/2007  . HYPERLIPIDEMIA 02/12/2007  . HYPOKALEMIA 11/04/2007  . ANXIETY 02/12/2007  . DEPRESSION 02/12/2007  . HYPERTENSION 11/07/2006  . CORONARY ARTERY DISEASE 11/04/2007  . CAROTID ARTERY STENOSIS, BILATERAL 02/24/2007  . Unspecified Peripheral Vascular Disease 02/12/2007  . ASTHMATIC BRONCHITIS, ACUTE 04/22/2008  . ALLERGIC RHINITIS 02/12/2007  . COPD 11/04/2007  . GERD 02/12/2007  . CONSTIPATION 02/12/2007  . DEGENERATIVE JOINT DISEASE, FINGERS 03/04/2008  . LOW BACK PAIN, CHRONIC 02/01/2009  . BACK PAIN, RIGHT 12/05/2009  . LEG PAIN, BILATERAL 02/01/2009  . OSTEOPOROSIS 02/12/2007  . DIZZINESS 02/12/2007  . INSOMNIA-SLEEP DISORDER-UNSPEC 08/04/2007  . FATIGUE 02/12/2007  . CHEST PAIN 02/12/2007  . Coronary artery disease 9/09    nonobstructive by cath  .  Peripheral vascular disease 9/09    left carotid 60-80%   . Impaired glucose tolerance 05/11/2010  . Leg pain, bilateral 05/12/2010  . Syncope and collapse   . Essential hypertension, benign 06/17/2012   Past Surgical History  Procedure Laterality Date  . S/p bowel obstruction    . Abdominal hysterectomy    . Appendectomy    . Oophorectomy    . Tonsillectomy    . S/p right cea  2/09  . S/p right thyroid nodule biopsy  March 2011    negative  . Carotid endarterectomy    . Cardiac catheterization      reports that she has been smoking.  She has never used smokeless tobacco. She reports that she drinks alcohol. She reports that she does not use illicit drugs. family history includes Alcohol abuse in her other; Cancer in her son; Diabetes in her other; Heart attack in her other; Hypertension in her other; Stroke in her other. Allergies  Allergen Reactions  . Tramadol     constipation   Current Outpatient Prescriptions on File Prior to Visit  Medication Sig Dispense Refill  . acetaminophen-codeine (TYLENOL #3) 300-30 MG per tablet TAKE 1 TABLET BY MOUTH EVERY 6 HOURS AS NEEDED FOR PAIN  60 tablet  1  . alendronate (FOSAMAX) 70 MG tablet Take 70 mg by mouth every 7 (seven) days. Take with a full glass of water on an empty stomach.      Marland Kitchen aspirin 81 MG tablet Take 81 mg by mouth daily.      Marland Kitchen  atenolol (TENORMIN) 50 MG tablet Take 1 tablet (50 mg total) by mouth daily.  30 tablet  2  . benzonatate (TESSALON) 100 MG capsule 1-2 tabs by mouth three times per day as needed for cough  60 capsule  1  . cholecalciferol (VITAMIN D) 1000 UNITS tablet Take 1,000 Units by mouth daily.       . cloNIDine (CATAPRES) 0.2 MG tablet Take 1 tablet (0.2 mg total) by mouth 2 (two) times daily.  180 tablet  3  . feeding supplement, ENSURE COMPLETE, (ENSURE COMPLETE) LIQD Take 237 mLs by mouth 2 (two) times daily between meals.  30 Bottle  0  . feeding supplement, RESOURCE BREEZE, (RESOURCE BREEZE) LIQD Take 1  Container by mouth daily.  30 Container  0  . lisinopril (PRINIVIL,ZESTRIL) 20 MG tablet Take 1 tablet (20 mg total) by mouth daily.  90 tablet  3  . oxybutynin (DITROPAN-XL) 10 MG 24 hr tablet Take 1 tablet (10 mg total) by mouth daily.  90 tablet  3  . simvastatin (ZOCOR) 40 MG tablet TAKE 1 TABLET (40 MG TOTAL) BY MOUTH DAILY.  90 tablet  3  . zolpidem (AMBIEN) 5 MG tablet Take 1 tablet (5 mg total) by mouth at bedtime as needed for sleep.  30 tablet  5   No current facility-administered medications on file prior to visit.    Review of Systems  Constitutional: Negative for unexpected weight change, or unusual diaphoresis  HENT: Negative for tinnitus.   Eyes: Negative for photophobia and visual disturbance.  Respiratory: Negative for choking and stridor.   Gastrointestinal: Negative for vomiting and blood in stool.  Genitourinary: Negative for hematuria and decreased urine volume.  Musculoskeletal: Negative for acute joint swelling Skin: Negative for color change and wound.  Neurological: Negative for tremors and numbness other than noted  Psychiatric/Behavioral: Negative for decreased concentration or  hyperactivity.       Objective:   Physical Exam BP 160/84  Pulse 73  Temp(Src) 98.1 F (36.7 C) (Oral)  Ht 5\' 4"  (1.626 m)  Wt 113 lb 2 oz (51.313 kg)  BMI 19.41 kg/m2  SpO2 98% VS noted,  Constitutional: Pt appears well-developed and well-nourished.  HENT: Head: NCAT.  Right Ear: External ear normal.  Left Ear: External ear normal.  Eyes: Conjunctivae and EOM are normal. Pupils are equal, round, and reactive to light.  Neck: Normal range of motion. Neck supple.  Cardiovascular: Normal rate and regular rhythm.   Pulmonary/Chest: Effort normal and breath sounds decrased bilat.  Abd:  Soft, NT, non-distended, + BS Neurological: Pt is alert. Not confused  Skin: Skin is warm. No erythema. dorsalis pedis trace bilat Psychiatric: Pt behavior is normal. Thought content normal.  mild nervous    Assessment & Plan:

## 2013-01-06 NOTE — Patient Instructions (Addendum)
You had the flu shot today Please return in 2 wks for a Nurse Visit for the new Prevnar pneumonia shot Please remember to followup with your yearly mammogram (consider [facility] on [address]) or [facility] on [address].  Please stop smoking Please go to the LAB in the Basement (turn left off the elevator) for the tests to be done today You will be contacted by phone if any changes need to be made immediately.  Otherwise, you will receive a letter about your results with an explanation, but please check with MyChart first.  You will be contacted regarding the referral for: Lower extremity dopplers (to check leg circulation)  Please keep your appointments with your specialists as you may have planned  Please return in 6 months, or sooner if needed

## 2013-01-06 NOTE — Assessment & Plan Note (Signed)
Counseled to quit 

## 2013-01-06 NOTE — Assessment & Plan Note (Signed)
stable overall by history and exam, recent data reviewed with pt, and pt to continue medical treatment as before,  to f/u any worsening symptoms or concerns Lab Results  Component Value Date   LDLCALC 81 07/09/2012

## 2013-01-06 NOTE — Progress Notes (Signed)
Pre-visit discussion using our clinic review tool. No additional management support is needed unless otherwise documented below in the visit note.  

## 2013-02-06 IMAGING — US US SOFT TISSUE HEAD/NECK
1 series · 14 of 25 positions shown · non-contrast
Comparison: 03/02/2009

CLINICAL DATA: Thyroid nodule.

THYROID ULTRASOUND
TECHNIQUE: Ultrasound examination of the thyroid gland and adjacent
soft tissues was performed.

[Series 1: us soft tissue head/neck · 0.06mm/px · 14 of 65 slices shown]
[im 1/65]
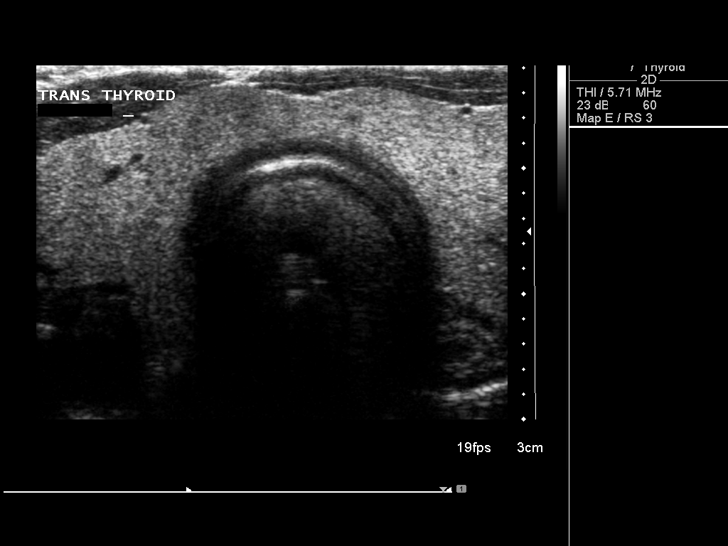
[im 6/65]
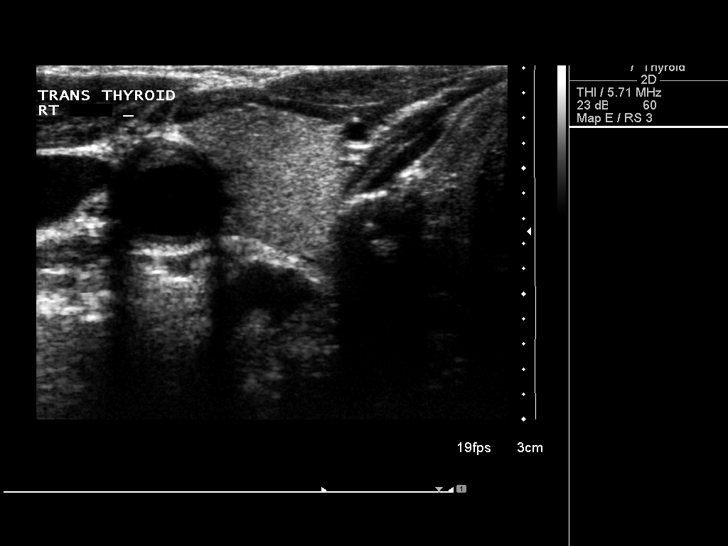
[im 11/65]
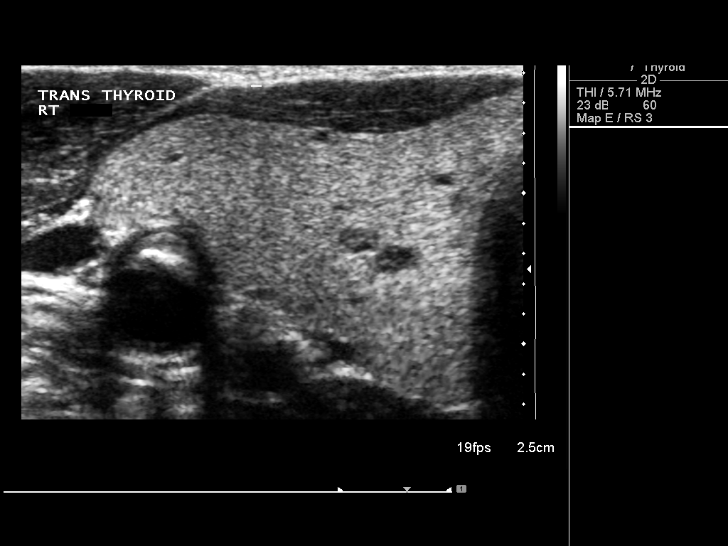
[im 17/65]
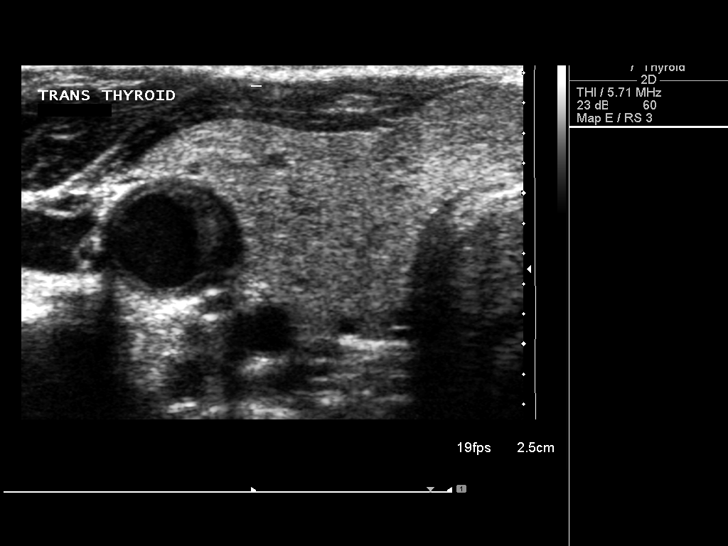
[im 22/65]
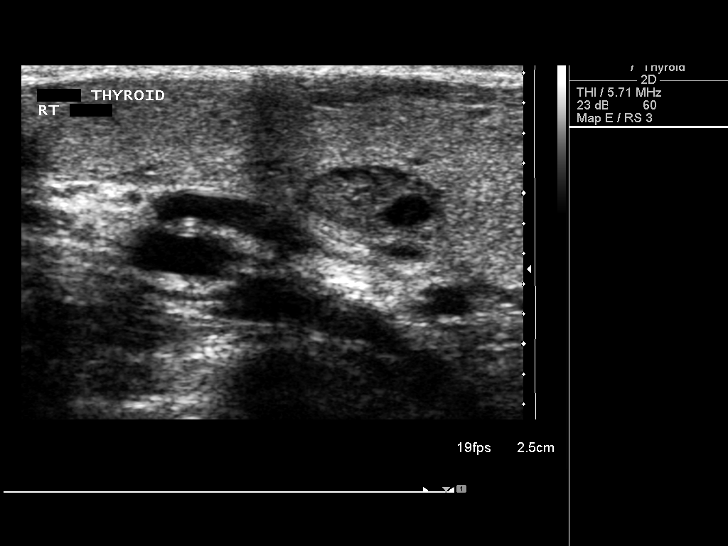
[im 25/65]
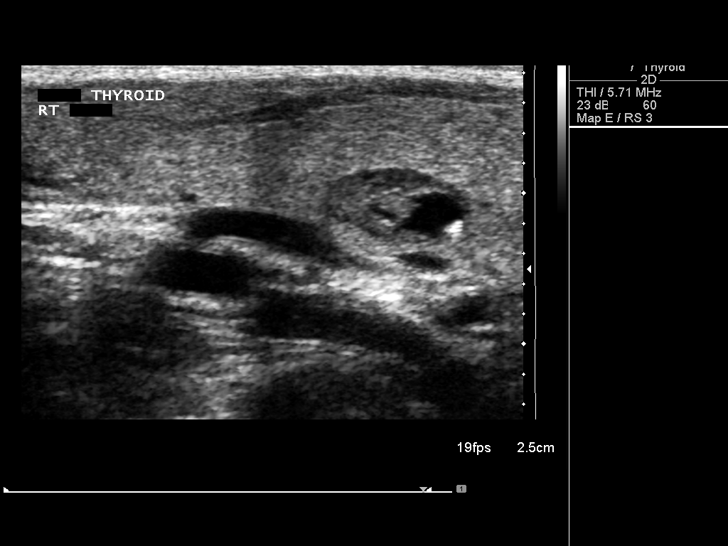
[im 30/65]
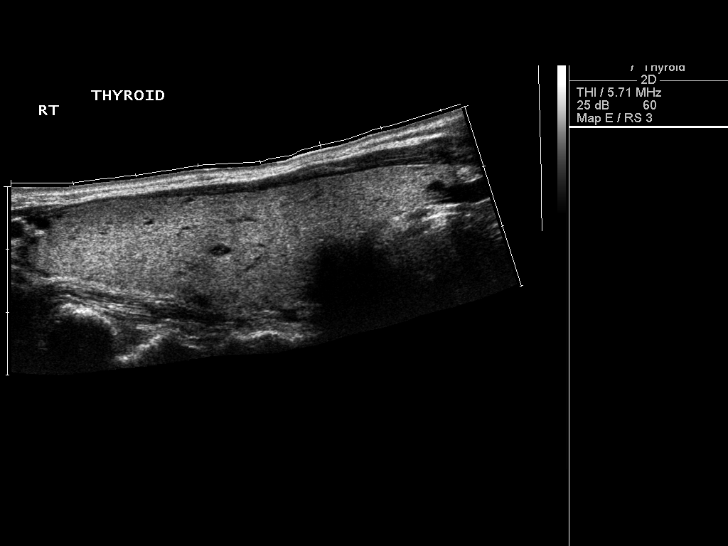
[im 35/65]
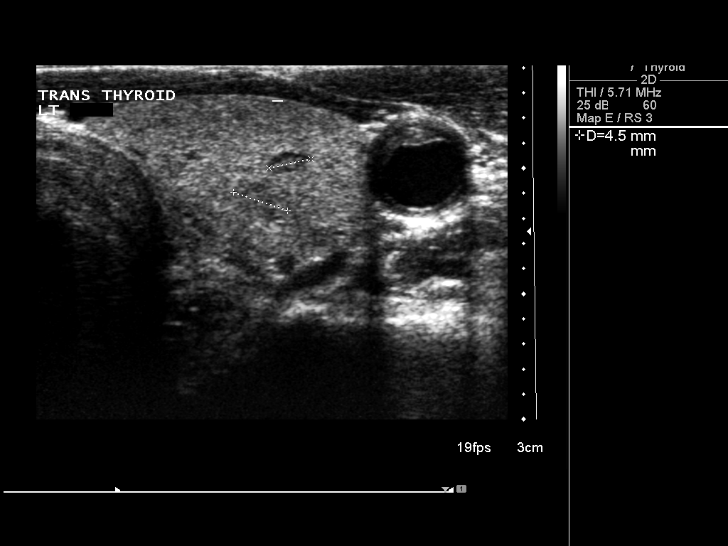
[im 41/65]
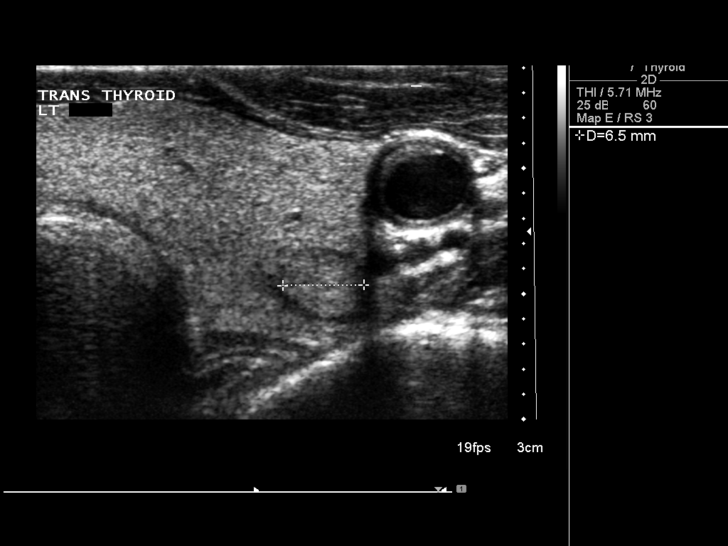
[im 43/65]
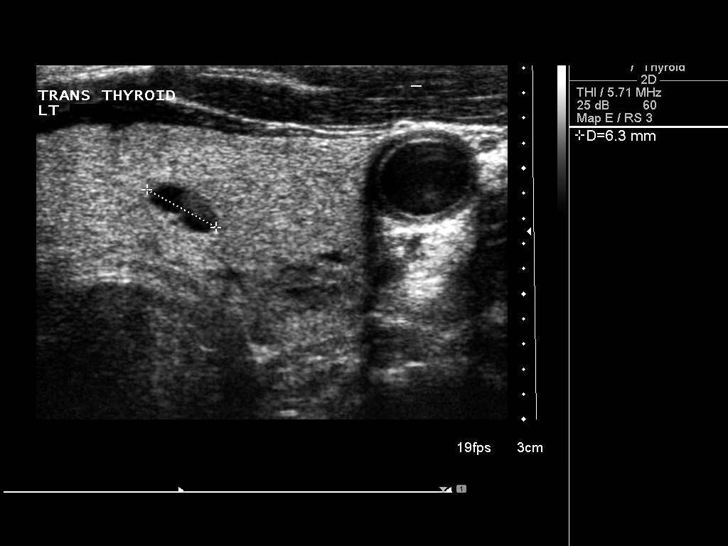
[im 49/65]
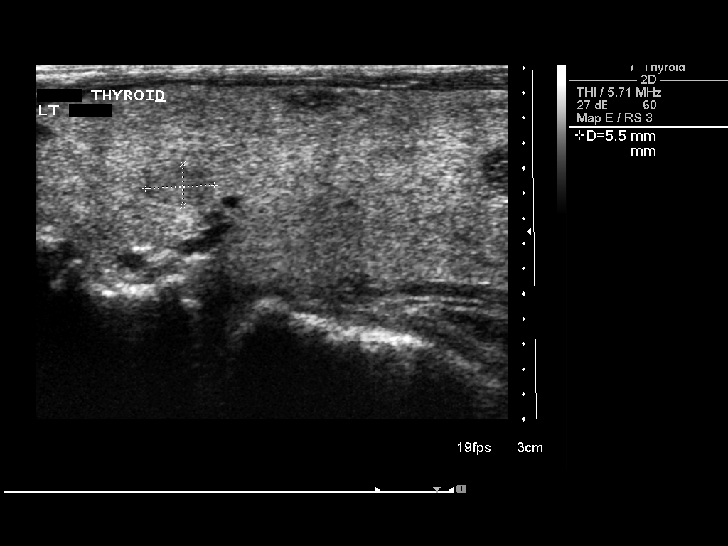
[im 54/65]
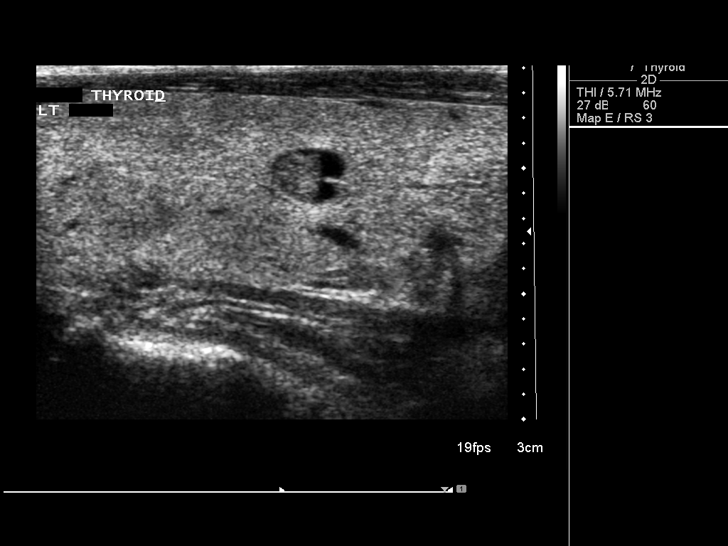
[im 59/65]
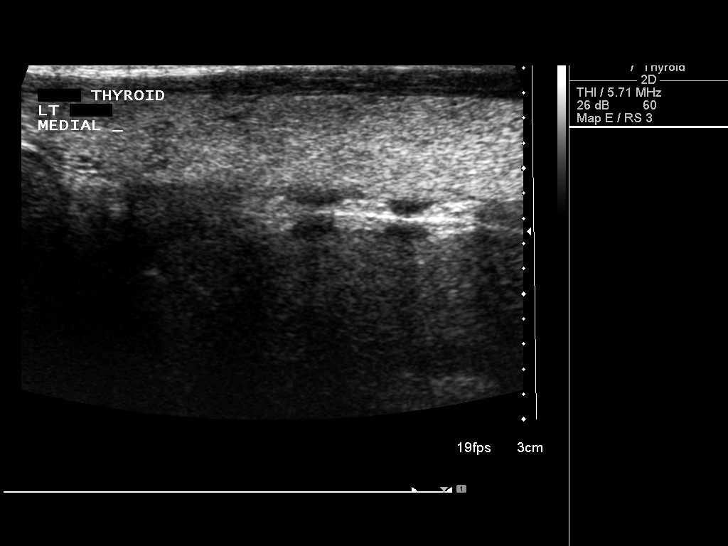
[im 65/65]
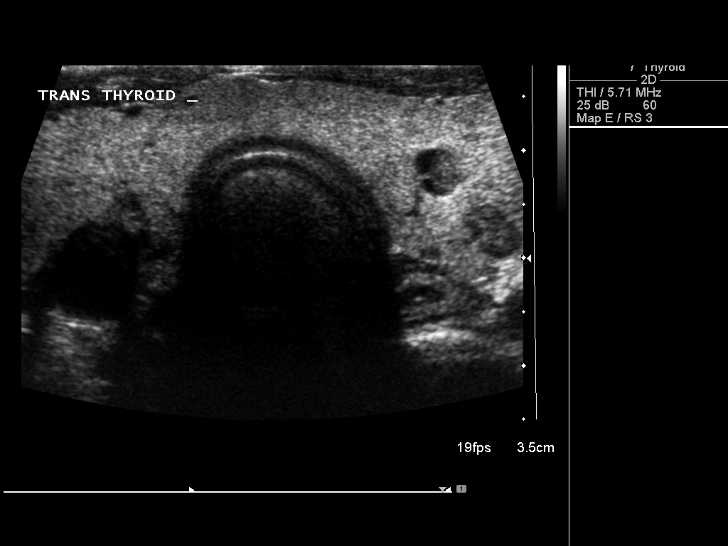

[14 of 25 positions shown; findings below may reference images not displayed]

FINDINGS: Right thyroid lobe:  6.9 x 1.8 x 2.6 cm.
Left thyroid lobe:  6.4 x 2.0 x 2.1 cm.
Isthmus:  7 mm.

Focal nodules:  Numerous small thyroid nodules noted bilaterally,
10 mm or less in size.  The largest on the right is in the right
lower pole which is mixed solid and cystic with single internal
calcification.  This measures 10 x 9 x 5 mm compared to 11 x 9 x 7
mm previously.  On the left, largest nodule is in the lower pole
with solid appearance, measuring 9 x 8 x 5 mm.  This is stable
since prior study.  No enlarging nodules.

Lymphadenopathy:  None visualized.
IMPRESSION: Continued numerous bilateral thyroid nodules, 2 mm or less in size.
No enlarging nodules.

## 2013-02-08 ENCOUNTER — Other Ambulatory Visit: Payer: Self-pay | Admitting: Internal Medicine

## 2013-03-29 ENCOUNTER — Other Ambulatory Visit: Payer: Self-pay | Admitting: Internal Medicine

## 2013-03-31 NOTE — Telephone Encounter (Signed)
Faxed hardcopy to [facility] [address] [place]

## 2013-03-31 NOTE — Telephone Encounter (Signed)
Done hardcopy to robin  

## 2013-04-22 ENCOUNTER — Observation Stay (HOSPITAL_COMMUNITY)
Admission: EM | Admit: 2013-04-22 | Discharge: 2013-04-23 | Disposition: A | Discharge status: Home / Self Care | Payer: Medicare Other | Attending: Internal Medicine | Admitting: Internal Medicine

## 2013-04-22 ENCOUNTER — Emergency Department (HOSPITAL_COMMUNITY): Payer: Medicare Other

## 2013-04-22 ENCOUNTER — Encounter (HOSPITAL_COMMUNITY): Payer: Self-pay | Admitting: Emergency Medicine

## 2013-04-22 DIAGNOSIS — Z0000 Encounter for general adult medical examination without abnormal findings: Secondary | ICD-10-CM

## 2013-04-22 DIAGNOSIS — N189 Chronic kidney disease, unspecified: Secondary | ICD-10-CM | POA: Insufficient documentation

## 2013-04-22 DIAGNOSIS — I2510 Atherosclerotic heart disease of native coronary artery without angina pectoris: Secondary | ICD-10-CM

## 2013-04-22 DIAGNOSIS — K5900 Constipation, unspecified: Secondary | ICD-10-CM

## 2013-04-22 DIAGNOSIS — F17200 Nicotine dependence, unspecified, uncomplicated: Secondary | ICD-10-CM

## 2013-04-22 DIAGNOSIS — E041 Nontoxic single thyroid nodule: Secondary | ICD-10-CM

## 2013-04-22 DIAGNOSIS — N3281 Overactive bladder: Secondary | ICD-10-CM

## 2013-04-22 DIAGNOSIS — I10 Essential (primary) hypertension: Secondary | ICD-10-CM

## 2013-04-22 DIAGNOSIS — R5381 Other malaise: Secondary | ICD-10-CM

## 2013-04-22 DIAGNOSIS — I7100 Dissection of unspecified site of aorta: Secondary | ICD-10-CM | POA: Insufficient documentation

## 2013-04-22 DIAGNOSIS — R7302 Impaired glucose tolerance (oral): Secondary | ICD-10-CM

## 2013-04-22 DIAGNOSIS — M19049 Primary osteoarthritis, unspecified hand: Secondary | ICD-10-CM

## 2013-04-22 DIAGNOSIS — E876 Hypokalemia: Secondary | ICD-10-CM

## 2013-04-22 DIAGNOSIS — M6530 Trigger finger, unspecified finger: Secondary | ICD-10-CM

## 2013-04-22 DIAGNOSIS — R197 Diarrhea, unspecified: Secondary | ICD-10-CM

## 2013-04-22 DIAGNOSIS — E43 Unspecified severe protein-calorie malnutrition: Secondary | ICD-10-CM

## 2013-04-22 DIAGNOSIS — M545 Low back pain, unspecified: Secondary | ICD-10-CM

## 2013-04-22 DIAGNOSIS — R55 Syncope and collapse: Secondary | ICD-10-CM

## 2013-04-22 DIAGNOSIS — R102 Pelvic and perineal pain: Secondary | ICD-10-CM

## 2013-04-22 DIAGNOSIS — A084 Viral intestinal infection, unspecified: Secondary | ICD-10-CM

## 2013-04-22 DIAGNOSIS — M1710 Unilateral primary osteoarthritis, unspecified knee: Secondary | ICD-10-CM

## 2013-04-22 DIAGNOSIS — R109 Unspecified abdominal pain: Secondary | ICD-10-CM

## 2013-04-22 DIAGNOSIS — K219 Gastro-esophageal reflux disease without esophagitis: Secondary | ICD-10-CM

## 2013-04-22 DIAGNOSIS — I739 Peripheral vascular disease, unspecified: Secondary | ICD-10-CM | POA: Insufficient documentation

## 2013-04-22 DIAGNOSIS — Z7982 Long term (current) use of aspirin: Secondary | ICD-10-CM | POA: Insufficient documentation

## 2013-04-22 DIAGNOSIS — R1110 Vomiting, unspecified: Secondary | ICD-10-CM

## 2013-04-22 DIAGNOSIS — F430 Acute stress reaction: Secondary | ICD-10-CM | POA: Insufficient documentation

## 2013-04-22 DIAGNOSIS — N179 Acute kidney failure, unspecified: Secondary | ICD-10-CM

## 2013-04-22 DIAGNOSIS — J4489 Other specified chronic obstructive pulmonary disease: Secondary | ICD-10-CM

## 2013-04-22 DIAGNOSIS — A088 Other specified intestinal infections: Principal | ICD-10-CM | POA: Insufficient documentation

## 2013-04-22 DIAGNOSIS — F329 Major depressive disorder, single episode, unspecified: Secondary | ICD-10-CM

## 2013-04-22 DIAGNOSIS — M79605 Pain in left leg: Secondary | ICD-10-CM

## 2013-04-22 DIAGNOSIS — E785 Hyperlipidemia, unspecified: Secondary | ICD-10-CM

## 2013-04-22 DIAGNOSIS — R112 Nausea with vomiting, unspecified: Secondary | ICD-10-CM

## 2013-04-22 DIAGNOSIS — J449 Chronic obstructive pulmonary disease, unspecified: Secondary | ICD-10-CM

## 2013-04-22 DIAGNOSIS — J309 Allergic rhinitis, unspecified: Secondary | ICD-10-CM

## 2013-04-22 DIAGNOSIS — I129 Hypertensive chronic kidney disease with stage 1 through stage 4 chronic kidney disease, or unspecified chronic kidney disease: Secondary | ICD-10-CM | POA: Insufficient documentation

## 2013-04-22 DIAGNOSIS — I6529 Occlusion and stenosis of unspecified carotid artery: Secondary | ICD-10-CM

## 2013-04-22 DIAGNOSIS — I658 Occlusion and stenosis of other precerebral arteries: Secondary | ICD-10-CM | POA: Insufficient documentation

## 2013-04-22 DIAGNOSIS — M79604 Pain in right leg: Secondary | ICD-10-CM

## 2013-04-22 DIAGNOSIS — IMO0002 Reserved for concepts with insufficient information to code with codable children: Secondary | ICD-10-CM | POA: Insufficient documentation

## 2013-04-22 DIAGNOSIS — R1024 Suprapubic pain: Secondary | ICD-10-CM

## 2013-04-22 DIAGNOSIS — F3289 Other specified depressive episodes: Secondary | ICD-10-CM

## 2013-04-22 DIAGNOSIS — R5383 Other fatigue: Secondary | ICD-10-CM

## 2013-04-22 DIAGNOSIS — M810 Age-related osteoporosis without current pathological fracture: Secondary | ICD-10-CM

## 2013-04-22 DIAGNOSIS — G4700 Insomnia, unspecified: Secondary | ICD-10-CM

## 2013-04-22 DIAGNOSIS — F411 Generalized anxiety disorder: Secondary | ICD-10-CM

## 2013-04-22 DIAGNOSIS — N390 Urinary tract infection, site not specified: Secondary | ICD-10-CM

## 2013-04-22 DIAGNOSIS — M179 Osteoarthritis of knee, unspecified: Secondary | ICD-10-CM

## 2013-04-22 LAB — CBC WITH DIFFERENTIAL/PLATELET
BASOS PCT: 0 % (ref 0–1)
Basophils Absolute: 0 10*3/uL (ref 0.0–0.1)
Eosinophils Absolute: 0 10*3/uL (ref 0.0–0.7)
Eosinophils Relative: 0 % (ref 0–5)
HEMATOCRIT: 55.5 % — AB (ref 36.0–46.0)
HEMOGLOBIN: 19.9 g/dL — AB (ref 12.0–15.0)
Lymphocytes Relative: 6 % — ABNORMAL LOW (ref 12–46)
Lymphs Abs: 0.8 10*3/uL (ref 0.7–4.0)
MCH: 31.4 pg (ref 26.0–34.0)
MCHC: 35.9 g/dL (ref 30.0–36.0)
MCV: 87.5 fL (ref 78.0–100.0)
MONO ABS: 0.7 10*3/uL (ref 0.1–1.0)
MONOS PCT: 5 % (ref 3–12)
Neutro Abs: 12.5 10*3/uL — ABNORMAL HIGH (ref 1.7–7.7)
Neutrophils Relative %: 89 % — ABNORMAL HIGH (ref 43–77)
Platelets: 237 10*3/uL (ref 150–400)
RBC: 6.34 MIL/uL — ABNORMAL HIGH (ref 3.87–5.11)
RDW: 14.9 % (ref 11.5–15.5)
WBC: 14 10*3/uL — ABNORMAL HIGH (ref 4.0–10.5)

## 2013-04-22 LAB — I-STAT CG4 LACTIC ACID, ED
LACTIC ACID, VENOUS: 5.67 mmol/L — AB (ref 0.5–2.2)
LACTIC ACID, VENOUS: 1.22 mmol/L (ref 0.5–2.2)

## 2013-04-22 LAB — URINALYSIS, ROUTINE W REFLEX MICROSCOPIC
BILIRUBIN URINE: NEGATIVE
GLUCOSE, UA: 100 mg/dL — AB
KETONES UR: NEGATIVE mg/dL
LEUKOCYTES UA: NEGATIVE
Nitrite: NEGATIVE
Specific Gravity, Urine: 1.01 (ref 1.005–1.030)
Urobilinogen, UA: 0.2 mg/dL (ref 0.0–1.0)
pH: 6 (ref 5.0–8.0)

## 2013-04-22 LAB — URINE MICROSCOPIC-ADD ON

## 2013-04-22 LAB — BASIC METABOLIC PANEL
BUN: 29 mg/dL — AB (ref 6–23)
CALCIUM: 11 mg/dL — AB (ref 8.4–10.5)
CHLORIDE: 93 meq/L — AB (ref 96–112)
CO2: 24 meq/L (ref 19–32)
CREATININE: 1.34 mg/dL — AB (ref 0.50–1.10)
GFR calc non Af Amer: 39 mL/min — ABNORMAL LOW (ref >90)
GFR, EST AFRICAN AMERICAN: 45 mL/min — AB (ref >90)
Glucose, Bld: 250 mg/dL — ABNORMAL HIGH (ref 70–99)
Potassium: 3.3 mEq/L — ABNORMAL LOW (ref 3.7–5.3)
Sodium: 143 mEq/L (ref 137–147)

## 2013-04-22 LAB — TROPONIN I: Troponin I: <0.3 ng/mL (ref <0.30)

## 2013-04-22 MED ORDER — HEPARIN SODIUM (PORCINE) 5000 UNIT/ML IJ SOLN
5000.0000 [IU] | Freq: Three times a day (TID) | INTRAMUSCULAR | Status: DC
  Administered 2013-04-22 – 2013-04-23 (×2): 5000 [IU] via SUBCUTANEOUS
  Filled 2013-04-22 (×6): qty 1

## 2013-04-22 MED ORDER — SIMVASTATIN 40 MG PO TABS
40.0000 mg | ORAL_TABLET | Freq: Every day | ORAL | Status: DC
  Administered 2013-04-22: 40 mg via ORAL
  Filled 2013-04-22 (×2): qty 1

## 2013-04-22 MED ORDER — PIPERACILLIN-TAZOBACTAM 4.5 G IVPB
4.5000 g | Freq: Once | INTRAVENOUS | Status: AC
  Administered 2013-04-22: 4.5 g via INTRAVENOUS
  Filled 2013-04-22: qty 100

## 2013-04-22 MED ORDER — SODIUM CHLORIDE 0.9 % IV BOLUS (SEPSIS)
1000.0000 mL | Freq: Once | INTRAVENOUS | Status: AC
  Administered 2013-04-22: 1000 mL via INTRAVENOUS

## 2013-04-22 MED ORDER — ONDANSETRON HCL 4 MG PO TABS: 4.0000 mg | ORAL_TABLET | Freq: Four times a day (QID) | ORAL | Status: DC | PRN

## 2013-04-22 MED ORDER — SODIUM CHLORIDE 0.9 % IV SOLN
INTRAVENOUS | Status: AC
  Administered 2013-04-22: 75 mL/h via INTRAVENOUS
  Administered 2013-04-22 – 2013-04-23 (×2): via INTRAVENOUS

## 2013-04-22 MED ORDER — IOHEXOL 350 MG/ML SOLN
100.0000 mL | Freq: Once | INTRAVENOUS | Status: AC | PRN
  Administered 2013-04-22: 80 mL via INTRAVENOUS

## 2013-04-22 MED ORDER — ENSURE COMPLETE PO LIQD
237.0000 mL | ORAL | Status: DC
  Administered 2013-04-23: 237 mL via ORAL

## 2013-04-22 MED ORDER — ONDANSETRON HCL 4 MG/2ML IJ SOLN
4.0000 mg | Freq: Once | INTRAMUSCULAR | Status: AC
  Administered 2013-04-22: 4 mg via INTRAVENOUS
  Filled 2013-04-22: qty 2

## 2013-04-22 MED ORDER — ONDANSETRON HCL 4 MG/2ML IJ SOLN: 4.0000 mg | Freq: Four times a day (QID) | INTRAMUSCULAR | Status: DC | PRN

## 2013-04-22 MED ORDER — ASPIRIN 81 MG PO CHEW
81.0000 mg | CHEWABLE_TABLET | Freq: Every day | ORAL | Status: DC
  Administered 2013-04-22 – 2013-04-23 (×2): 81 mg via ORAL
  Filled 2013-04-22 (×2): qty 1

## 2013-04-22 MED ORDER — CLONIDINE HCL 0.2 MG PO TABS
0.2000 mg | ORAL_TABLET | Freq: Two times a day (BID) | ORAL | Status: DC
  Administered 2013-04-22 – 2013-04-23 (×3): 0.2 mg via ORAL
  Filled 2013-04-22 (×4): qty 1

## 2013-04-22 MED ORDER — OXYBUTYNIN CHLORIDE ER 10 MG PO TB24
10.0000 mg | ORAL_TABLET | Freq: Every day | ORAL | Status: DC
  Administered 2013-04-22 – 2013-04-23 (×2): 10 mg via ORAL
  Filled 2013-04-22 (×2): qty 1

## 2013-04-22 MED ORDER — MORPHINE SULFATE 4 MG/ML IJ SOLN
4.0000 mg | Freq: Once | INTRAMUSCULAR | Status: AC
  Administered 2013-04-22: 4 mg via INTRAVENOUS
  Filled 2013-04-22: qty 1

## 2013-04-22 MED ORDER — ATENOLOL 50 MG PO TABS
50.0000 mg | ORAL_TABLET | Freq: Every day | ORAL | Status: DC
  Administered 2013-04-22 – 2013-04-23 (×2): 50 mg via ORAL
  Filled 2013-04-22 (×2): qty 1

## 2013-04-22 MED ORDER — ACETAMINOPHEN-CODEINE #3 300-30 MG PO TABS
1.0000 | ORAL_TABLET | Freq: Four times a day (QID) | ORAL | Status: DC | PRN
  Administered 2013-04-22: 1 via ORAL
  Filled 2013-04-22: qty 1

## 2013-04-22 MED ORDER — VANCOMYCIN HCL IN DEXTROSE 1-5 GM/200ML-% IV SOLN
1000.0000 mg | Freq: Once | INTRAVENOUS | Status: AC
  Administered 2013-04-22: 1000 mg via INTRAVENOUS
  Filled 2013-04-22: qty 200

## 2013-04-22 NOTE — ED Notes (Signed)
i-stat CG4+ result of 5.67 mmol/L given to  Dr. Kathrynn Humble

## 2013-04-22 NOTE — ED Notes (Signed)
Dr. Kathrynn Humble requests atenolol and clonidine be given now.

## 2013-04-22 NOTE — ED Notes (Signed)
Pt to ED via [facility] with c/o generalized abd pain, nausea, vomiting and diarrhea.  Onset earlier today.

## 2013-04-22 NOTE — ED Notes (Signed)
Patient has no complaints of nausea at this time.

## 2013-04-22 NOTE — Progress Notes (Signed)
Report called and received from ED. 

## 2013-04-22 NOTE — Progress Notes (Addendum)
[name] [id number] Code Status: Full   Admission Data: 04/22/2013 1:39 PM Attending Provider:  Cruzita Lederer OBS:[name], MD Consults/ Treatment Team:    [name] is a 73 y.o. female patient admitted from ED awake, alert - oriented  X 3 - no acute distress noted.  VSS - Blood pressure 131/65, pulse 60, temperature 97.7 F (36.5 C), temperature source Oral, resp. rate 20, height 5\' 4"  (1.626 m), weight 54.885 kg (121 lb), SpO2 95.00%.  no c/o shortness of breath, no c/o chest pain. IV Fluids:  IV in place, occlusive dsg intact without redness, IV cath hand right, condition patent and no redness none.  Allergies:   Allergies  Allergen Reactions  . Tramadol     constipation     Past Medical History  Diagnosis Date  . THYROID NODULE, RIGHT 02/01/2009  . GLUCOSE INTOLERANCE 11/04/2007  . HYPERLIPIDEMIA 02/12/2007  . HYPOKALEMIA 11/04/2007  . ANXIETY 02/12/2007  . DEPRESSION 02/12/2007  . HYPERTENSION 11/07/2006  . CORONARY ARTERY DISEASE 11/04/2007  . CAROTID ARTERY STENOSIS, BILATERAL 02/24/2007  . Unspecified Peripheral Vascular Disease 02/12/2007  . ASTHMATIC BRONCHITIS, ACUTE 04/22/2008  . ALLERGIC RHINITIS 02/12/2007  . COPD 11/04/2007  . GERD 02/12/2007  . CONSTIPATION 02/12/2007  . DEGENERATIVE JOINT DISEASE, FINGERS 03/04/2008  . LOW BACK PAIN, CHRONIC 02/01/2009  . BACK PAIN, RIGHT 12/05/2009  . LEG PAIN, BILATERAL 02/01/2009  . OSTEOPOROSIS 02/12/2007  . DIZZINESS 02/12/2007  . INSOMNIA-SLEEP DISORDER-UNSPEC 08/04/2007  . FATIGUE 02/12/2007  . CHEST PAIN 02/12/2007  . Coronary artery disease 9/09    nonobstructive by cath  . Peripheral vascular disease 9/09    left carotid 60-80%   . Impaired glucose tolerance 05/11/2010  . Leg pain, bilateral 05/12/2010  . Syncope and collapse   . Essential hypertension, benign 06/17/2012   Medications Prior to Admission  Medication Sig Dispense Refill  . acetaminophen-codeine (TYLENOL #3) 300-30 MG per tablet Take 1 tablet by mouth every 6  (six) hours as needed for moderate pain.      Marland Kitchen acetaminophen-codeine (TYLENOL #3) 300-30 MG per tablet TAKE 1 TABLET BY MOUTH EVERY 6 HOURS AS NEEDED FOR PAIN      . alendronate (FOSAMAX) 70 MG tablet Take 70 mg by mouth every 7 (seven) days. Take with a full glass of water on an empty stomach.      Marland Kitchen aspirin 81 MG tablet Take 81 mg by mouth daily.      . Aspirin-Salicylamide-Caffeine (BC HEADACHE POWDER PO) Take 1 packet by mouth daily as needed (for pain).      Marland Kitchen atenolol (TENORMIN) 50 MG tablet Take 50 mg by mouth daily.      . benzonatate (TESSALON) 100 MG capsule Take 100-200 mg by mouth 3 (three) times daily as needed for cough.      . benzonatate (TESSALON) 100 MG capsule TAKE 1-2 CAPSULES BY MOUTH THREE TIMES PER DAY AS NEEDED FOR COUGH      . cholecalciferol (VITAMIN D) 1000 UNITS tablet Take 1,000 Units by mouth daily.       . cloNIDine (CATAPRES) 0.2 MG tablet Take 0.2 mg by mouth 2 (two) times daily.      . feeding supplement, ENSURE COMPLETE, (ENSURE COMPLETE) LIQD Take 237 mLs by mouth every other day.      . oxybutynin (DITROPAN-XL) 10 MG 24 hr tablet Take 10 mg by mouth daily.      . simvastatin (ZOCOR) 40 MG tablet Take 40 mg by mouth daily.      Marland Kitchen  zolpidem (AMBIEN) 5 MG tablet Take 5 mg by mouth at bedtime as needed for sleep.      Marland Kitchen zolpidem (AMBIEN) 5 MG tablet TAKE 1 TABLET BY MOUTH EVERY DAY AT BEDTIME AS NEEDED FOR SLEEP       History:  obtained from the patient. Tobacco/alcohol: 1 pack every 2 days for 55 years none  Orientation to room, and floor completed with information packet given to patient/family.  Admission INP armband ID verified with patient/family, and in place.   SR up x 2, fall assessment complete, with patient and family able to verbalize understanding of risk associated with falls, and verbalized understanding to call nsg before up out of bed.  Call light within reach, patient able to voice, and demonstrate understanding.  Skin, clean-dry- intact without  evidence of bruising, or skin tears.   No evidence of skin break down noted on exam.     Will cont to eval and treat per MD orders.  Henriette Combs, [place] 04/22/2013 1:39 PM

## 2013-04-22 NOTE — ED Notes (Signed)
Patient transported to CT 

## 2013-04-22 NOTE — ED Notes (Signed)
Spoke to Dr. Kathrynn Humble about plan of care. Latest Lactic acid is 1.22.  MD orders a fluid challenge, and if patient passes, she will be prepared for discharge.

## 2013-04-22 NOTE — H&P (Addendum)
History and Physical    [name] AOZ:[id number] DOB: 03-28-1940 DOA: 04/22/2013  Referring physician: Dr. Kathrynn Humble PCP: Cathlean Cower, MD  Specialists: none  Chief Complaint: Abdominal pain  HPI: [name] is a 73 y.o. female has a past medical history significant for COPD, coronary artery disease, tobacco abuse, hypertension, hyperlipidemia, presents to the emergency room with a chief complaint of abdominal pain. She states that her abdominal pain is located in bilateral lower quadrants, with sudden onset last night. She also describes 2 episodes of diarrhea, nausea and vomiting. She denies any epigastric abdominal pain. She has no blood in her stool or blood in her emesis. She denies any fever or chills. She has no chest pain shortness of breath. In the emergency room, on admission, she was found to have a leukocytosis of 14, initial lactic acid was 5 and she was started empirically on vancomycin and Zosyn. Chest x-ray was clear without any identifiable source. With hydration, lactic acid came back to normal levels, and patient was symptomatically feeling close to normal. She was given a trial of by mouth liquids with plans that if she tolerates she should be discharged home, however she was feeling very nauseous and unable to keep anything down. [facility] has been asked for admission.  Review of Systems:   As per history of present illness, otherwise negative  Past Medical History  Diagnosis Date  . THYROID NODULE, RIGHT 02/01/2009  . GLUCOSE INTOLERANCE 11/04/2007  . HYPERLIPIDEMIA 02/12/2007  . HYPOKALEMIA 11/04/2007  . ANXIETY 02/12/2007  . DEPRESSION 02/12/2007  . HYPERTENSION 11/07/2006  . CORONARY ARTERY DISEASE 11/04/2007  . CAROTID ARTERY STENOSIS, BILATERAL 02/24/2007  . Unspecified Peripheral Vascular Disease 02/12/2007  . ASTHMATIC BRONCHITIS, ACUTE 04/22/2008  . ALLERGIC RHINITIS 02/12/2007  . COPD 11/04/2007  . GERD 02/12/2007  . CONSTIPATION 02/12/2007  .  DEGENERATIVE JOINT DISEASE, FINGERS 03/04/2008  . LOW BACK PAIN, CHRONIC 02/01/2009  . BACK PAIN, RIGHT 12/05/2009  . LEG PAIN, BILATERAL 02/01/2009  . OSTEOPOROSIS 02/12/2007  . DIZZINESS 02/12/2007  . INSOMNIA-SLEEP DISORDER-UNSPEC 08/04/2007  . FATIGUE 02/12/2007  . CHEST PAIN 02/12/2007  . Coronary artery disease 9/09    nonobstructive by cath  . Peripheral vascular disease 9/09    left carotid 60-80%   . Impaired glucose tolerance 05/11/2010  . Leg pain, bilateral 05/12/2010  . Syncope and collapse   . Essential hypertension, benign 06/17/2012   Past Surgical History  Procedure Laterality Date  . S/p bowel obstruction    . Abdominal hysterectomy    . Appendectomy    . Oophorectomy    . Tonsillectomy    . S/p right cea  2/09  . S/p right thyroid nodule biopsy  March 2011    negative  . Carotid endarterectomy    . Cardiac catheterization     Social History:  reports that she has been smoking.  She has never used smokeless tobacco. She reports that she drinks alcohol. She reports that she does not use illicit drugs.  Allergies  Allergen Reactions  . Tramadol     constipation    Family History  Problem Relation Age of Onset  . Cancer Son     Possible colon cancer  . Heart attack Other   . Stroke Other   . Alcohol abuse Other   . Diabetes Other   . Hypertension Other    Prior to Admission medications   Medication Sig Start Date End Date Taking? Authorizing Provider  acetaminophen-codeine (TYLENOL #3)  300-30 MG per tablet Take 1 tablet by mouth every 6 (six) hours as needed for moderate pain.   Yes Historical Provider, MD  acetaminophen-codeine (TYLENOL #3) 300-30 MG per tablet TAKE 1 TABLET BY MOUTH EVERY 6 HOURS AS NEEDED FOR PAIN 10/02/12 04/22/13 Yes Biagio Borg, MD  alendronate (FOSAMAX) 70 MG tablet Take 70 mg by mouth every 7 (seven) days. Take with a full glass of water on an empty stomach.   Yes Historical Provider, MD  aspirin 81 MG tablet Take 81 mg by mouth daily.   Yes  Historical Provider, MD  Aspirin-Salicylamide-Caffeine (BC HEADACHE POWDER PO) Take 1 packet by mouth daily as needed (for pain).   Yes Historical Provider, MD  atenolol (TENORMIN) 50 MG tablet Take 50 mg by mouth daily. 12/07/12  Yes Oswald Hillock, MD  benzonatate (TESSALON) 100 MG capsule Take 100-200 mg by mouth 3 (three) times daily as needed for cough.   Yes Historical Provider, MD  benzonatate (TESSALON) 100 MG capsule TAKE 1-2 CAPSULES BY MOUTH THREE TIMES PER DAY AS NEEDED FOR COUGH 02/08/13 04/22/13 Yes Biagio Borg, MD  cholecalciferol (VITAMIN D) 1000 UNITS tablet Take 1,000 Units by mouth daily.    Yes Historical Provider, MD  cloNIDine (CATAPRES) 0.2 MG tablet Take 0.2 mg by mouth 2 (two) times daily. 06/17/12  Yes Biagio Borg, MD  feeding supplement, ENSURE COMPLETE, (ENSURE COMPLETE) LIQD Take 237 mLs by mouth every other day. 12/07/12  Yes Oswald Hillock, MD  oxybutynin (DITROPAN-XL) 10 MG 24 hr tablet Take 10 mg by mouth daily. 06/17/12  Yes Biagio Borg, MD  simvastatin (ZOCOR) 40 MG tablet Take 40 mg by mouth daily. 06/17/12  Yes Biagio Borg, MD  zolpidem (AMBIEN) 5 MG tablet Take 5 mg by mouth at bedtime as needed for sleep.   Yes Historical Provider, MD  zolpidem (AMBIEN) 5 MG tablet TAKE 1 TABLET BY MOUTH EVERY DAY AT BEDTIME AS NEEDED FOR SLEEP  04/22/13 Yes Biagio Borg, MD   Physical Exam: Filed Vitals:   04/22/13 0419 04/22/13 0430 04/22/13 0500 04/22/13 0545  BP: 177/81 155/89 160/73 156/74  Pulse: 93 96 92 94  Temp: 98.2 F (36.8 C)     TempSrc: Oral     Resp: 16 14 12 17   Height:      Weight:      SpO2: 100% 97% 94% 94%     General:  No apparent distress, pleasant AA female, cachectic  Eyes: PERRL, EOMI, no scleral icterus  ENT: moist oropharynx  Neck: supple, no JVD  Cardiovascular: regular rate without MRG; 2+ peripheral pulses  Respiratory: CTA biL, good air movement without wheezing, rhonchi or crackled  Abdomen: soft, mild tenderness to palpation  bilateral lower quadrants, positive bowel sounds, no guarding, no rebound  Skin: no rashes  Musculoskeletal: no peripheral edema  Psychiatric: normal mood and affect  Neurologic: CN 2-12 grossly intact, MS 5/5 in all 4  Labs on Admission:  Basic Metabolic Panel:  Recent Labs Lab 04/22/13 0101  NA 143  K 3.3*  CL 93*  CO2 24  GLUCOSE 250*  BUN 29*  CREATININE 1.34*  CALCIUM 11.0*   CBC:  Recent Labs Lab 04/22/13 0101  WBC 14.0*  NEUTROABS 12.5*  HGB 19.9*  HCT 55.5*  MCV 87.5  PLT 237   Cardiac Enzymes:  Recent Labs Lab 04/22/13 0101  TROPONINI <0.30    Radiological Exams on Admission: Dg Abd Acute W/chest  04/22/2013  CLINICAL DATA:  Emesis and dizziness, abdominal pain.  EXAM: ACUTE ABDOMEN SERIES (ABDOMEN 2 VIEW & CHEST 1 VIEW)  COMPARISON:  DG CHEST 2 VIEW dated 12/06/2012  FINDINGS: Cardiomediastinal silhouette is unremarkable. Mild increased lung volumes, with slight interstitial prominence, unchanged. Lungs are clear, no pleural effusions. No pneumothorax. Soft tissue planes and included osseous structures are unremarkable.  Bowel gas pattern is nondilated and nonobstructive, however there is an overall paucity of bowel gas. Vascular calcifications left upper quadrant ,phleboliths in the pelvis. No intra-abdominal mass effect, pathologic calcifications or free air. Soft tissue planes and included osseous structures are nonsuspicious.  IMPRESSION: COPD without superimposed acute cardiopulmonary process.  Nonspecific bowel gas pattern with overall paucity of bowel gas.   Electronically Signed   By: Elon Alas   On: 04/22/2013 02:08   Ct Angio Abd/pel W/ And/or W/o  04/22/2013   CLINICAL DATA:  Assess for ischemic bowel. Generalized abdominal pain, nausea, vomiting and diarrhea.  EXAM: CT ANGIOGRAPHY ABDOMEN AND PELVIS WITH CONTRAST AND WITHOUT CONTRAST  TECHNIQUE: Multidetector CT imaging of the abdomen and pelvis was performed using the standard protocol  during bolus administration of intravenous contrast. Multiplanar reconstructed images and MIPs were obtained and reviewed to evaluate the vascular anatomy.  CONTRAST:  65mL OMNIPAQUE IOHEXOL 350 MG/ML SOLN  COMPARISON:  DG ABD ACUTE W/CHEST dated 04/22/2013; CT ABD/PELV WO CM dated 12/05/2012  FINDINGS: Included view of the lung bases demonstrates minimal atelectasis or scarring. Apparent left ventricle hypertrophy, heart size is overall normal.  Abdominal aorta is normal in caliber with moderate calcific atherosclerosis. Chronic appearing infrarenal distal aortic dissection with calcification at the level of the inferior mesenteric artery which appears patent. No aneurysm, suspicious luminal irregularity, periaortic fluid collections or contrast extravasation. The left gastric artery appears to arise directly from the aorta. Celiac trunk, superior mesenteric arteries are widely patent. No mesenteric arterial filling defects, high-grade stenoses. Mild calcific atherosclerosis of the ostia bilateral renal arteries which are widely patent. Left Common iliac artery chronic dissection without flow-limiting stenosis. The right internal iliac artery is occluded within 1 cm of the origin, there is reconstitution distally with irregular appearance, which may reflect collateralization, Axial 127/205.  Patchy hypodense within the right lobe of the liver may reflect fatty infiltration, the liver is otherwise unremarkable. Less conspicuous appearance of the gallbladder sludge seen on prior CT. Spleen, pancreas and adrenal glands are nonsuspicious. Too small to characterize hypodensity in right interpolar kidney. 2 mm nonobstructing right lower pole renal calculus again seen. No hydronephrosis.  Limited assessment of the bowel due to lack of enteric contrast and overall paucity of intra-abdominal fat. However, the stomach, small large bowel overall normal course and caliber. Suspected sigmoid diverticulosis without superimposed  inflammatory changes. No intraperitoneal free fluid or free air. The appendix is not discretely identified, however there are no inflammatory change of the right lower quadrant.  Status post hysterectomy. The urinary bladder is under distended unremarkable. Phleboliths in the pelvis. Patient is osteopenic. Moderate L2-3 degenerative disc disease. Motion through the lower thorax results in spurious artifact fracture of T11.  Review of the MIP images confirms the above findings.  IMPRESSION: No CT angiographic findings of mesenteric arterial occlusion/high-grade stenosis or ischemic bowel. No acute intra-abdominal or pelvic process.  Chronic infrarenal aortic dissection, with left Common iliac artery chronic dissection without flow limiting stenosis. 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   Electronically Signed   By: Elon Alas   On: 04/22/2013 03:27  EKG: Independently reviewed.  Assessment/Plan Principal Problem:   Abdominal pain Active Problems:   HYPERLIPIDEMIA   CORONARY ARTERY DISEASE   CAROTID ARTERY STENOSIS, BILATERAL   COPD   GERD   Smoker   Essential hypertension, benign   Acute renal failure   Protein-calorie malnutrition, severe   Viral gastroenteritis  Abdominal pain - patient underwent a CT angiogram of the abdomen which showed no acute findings, it did show chronic infrarenal aortic dissection. Dr. Kathrynn Humble discussed case with vascular surgery, and no interventions are warranted at this time. With her diarrhea, nausea and vomiting, this may represent mild viral gastroenteritis with a component of colitis, without significant inflammation as CT was negative, will admit patient for IV hydration as she appears clinically dehydrated, monitor CBC, nausea control and clear liquid diet and hopefully we'll be able to advance her diet. If her diarrhea persists we'll consider obtaining infectious workup, the patient denies any  diarrheal bowel movements overnight and feels like this has resolved.  Hypertension - initially hypertensive in the emergency room with a systolic blood pressure in the 190s, restarted her oral clonidine and atenolol, she is normotensive on my evaluation. Tobacco abuse - counseled for cessation COPD - stable and without decompensation Coronary artery disease - stable without any evidence of decompensation Acute on chronic renal failure - creatinine is at baseline. Closely monitor given exposure to IV contrast. Hydrate over the next 24 hours. Protein calorie malnutrition, severe - continue nutritional supplements.  Diet: Fluids: DVT Prophylaxis:  Code Status: Full  Family Communication: son bedside  Disposition Plan: observation  Time spent: 71  This note has been created with Surveyor, quantity. Any transcriptional errors are unintentional.   Costin M. Cruzita Lederer, MD [facility] Pager [phone]  If 7PM-7AM, please contact night-coverage [email or web address] Password [facility] 04/22/2013, 8:51 AM

## 2013-04-22 NOTE — ED Notes (Signed)
Patient is off the unit.

## 2013-04-22 NOTE — ED Notes (Signed)
Report to [place], RN pt being moved to [facility]

## 2013-04-22 NOTE — ED Notes (Signed)
Patient returned to room. 

## 2013-04-22 NOTE — ED Notes (Signed)
Patient requested something to drink.  MD allows for mouth swabs only at this time.

## 2013-04-22 NOTE — ED Provider Notes (Addendum)
CSN: [id number]     Arrival date & time 04/22/13  0038 History   First MD Initiated Contact with Patient 04/22/13 0045     Chief Complaint  Patient presents with  . Emesis     (Consider location/radiation/quality/duration/timing/severity/associated sxs/prior Treatment) HPI Comments: 73 y/o female with hx of COPD, CVA, CAD, COPD comes in with cc of abd pain. Pt reports having sudden abd pain, around 8 om. Pain is located in the lower quadrants and is severe, non radiating, sharp pain. No hx of similar pain. No UTI like sx. + nausea, emesis and diarrhea. Non bilious and non bloody emesis, and no blood in the stools. Pt denies fevers.  Pt is noted to be tachycardic, tachypneic and with elevated BP. She has no chest pain, dib, cough.   Patient is a 73 y.o. female presenting with vomiting. The history is provided by the patient.  Emesis Associated symptoms: abdominal pain and diarrhea   Associated symptoms: no headaches     Past Medical History  Diagnosis Date  . THYROID NODULE, RIGHT 02/01/2009  . GLUCOSE INTOLERANCE 11/04/2007  . HYPERLIPIDEMIA 02/12/2007  . HYPOKALEMIA 11/04/2007  . ANXIETY 02/12/2007  . DEPRESSION 02/12/2007  . HYPERTENSION 11/07/2006  . CORONARY ARTERY DISEASE 11/04/2007  . CAROTID ARTERY STENOSIS, BILATERAL 02/24/2007  . Unspecified Peripheral Vascular Disease 02/12/2007  . ASTHMATIC BRONCHITIS, ACUTE 04/22/2008  . ALLERGIC RHINITIS 02/12/2007  . COPD 11/04/2007  . GERD 02/12/2007  . CONSTIPATION 02/12/2007  . DEGENERATIVE JOINT DISEASE, FINGERS 03/04/2008  . LOW BACK PAIN, CHRONIC 02/01/2009  . BACK PAIN, RIGHT 12/05/2009  . LEG PAIN, BILATERAL 02/01/2009  . OSTEOPOROSIS 02/12/2007  . DIZZINESS 02/12/2007  . INSOMNIA-SLEEP DISORDER-UNSPEC 08/04/2007  . FATIGUE 02/12/2007  . CHEST PAIN 02/12/2007  . Coronary artery disease 9/09    nonobstructive by cath  . Peripheral vascular disease 9/09    left carotid 60-80%   . Impaired glucose tolerance 05/11/2010  . Leg pain, bilateral  05/12/2010  . Syncope and collapse   . Essential hypertension, benign 06/17/2012   Past Surgical History  Procedure Laterality Date  . S/p bowel obstruction    . Abdominal hysterectomy    . Appendectomy    . Oophorectomy    . Tonsillectomy    . S/p right cea  2/09  . S/p right thyroid nodule biopsy  March 2011    negative  . Carotid endarterectomy    . Cardiac catheterization     Family History  Problem Relation Age of Onset  . Cancer Son     Possible colon cancer  . Heart attack Other   . Stroke Other   . Alcohol abuse Other   . Diabetes Other   . Hypertension Other    History  Substance Use Topics  . Smoking status: Current Every Day Smoker -- 0.50 packs/day  . Smokeless tobacco: Never Used  . Alcohol Use: Yes     Comment: beer occasionally   OB History   Grav Para Term Preterm Abortions TAB SAB Ect Mult Living                 Review of Systems  Constitutional: Negative for activity change.  Respiratory: Negative for shortness of breath.   Cardiovascular: Negative for chest pain.  Gastrointestinal: Positive for nausea, vomiting, abdominal pain and diarrhea.  Genitourinary: Negative for dysuria.  Musculoskeletal: Negative for neck pain.  Neurological: Negative for headaches.  All other systems reviewed and are negative.      Allergies  Tramadol  Home Medications   Current Outpatient Rx  Name  Route  Sig  Dispense  Refill  . acetaminophen-codeine (TYLENOL #3) 300-30 MG per tablet   Oral   Take 1 tablet by mouth every 6 (six) hours as needed for moderate pain.         Marland Kitchen acetaminophen-codeine (TYLENOL #3) 300-30 MG per tablet      TAKE 1 TABLET BY MOUTH EVERY 6 HOURS AS NEEDED FOR PAIN         . alendronate (FOSAMAX) 70 MG tablet   Oral   Take 70 mg by mouth every 7 (seven) days. Take with a full glass of water on an empty stomach.         Marland Kitchen aspirin 81 MG tablet   Oral   Take 81 mg by mouth daily.         . Aspirin-Salicylamide-Caffeine  (BC HEADACHE POWDER PO)   Oral   Take 1 packet by mouth daily as needed (for pain).         Marland Kitchen atenolol (TENORMIN) 50 MG tablet   Oral   Take 50 mg by mouth daily.         . benzonatate (TESSALON) 100 MG capsule   Oral   Take 100-200 mg by mouth 3 (three) times daily as needed for cough.         . benzonatate (TESSALON) 100 MG capsule      TAKE 1-2 CAPSULES BY MOUTH THREE TIMES PER DAY AS NEEDED FOR COUGH         . cholecalciferol (VITAMIN D) 1000 UNITS tablet   Oral   Take 1,000 Units by mouth daily.          . cloNIDine (CATAPRES) 0.2 MG tablet   Oral   Take 0.2 mg by mouth 2 (two) times daily.         . feeding supplement, ENSURE COMPLETE, (ENSURE COMPLETE) LIQD   Oral   Take 237 mLs by mouth every other day.         . oxybutynin (DITROPAN-XL) 10 MG 24 hr tablet   Oral   Take 10 mg by mouth daily.         . simvastatin (ZOCOR) 40 MG tablet   Oral   Take 40 mg by mouth daily.         Marland Kitchen zolpidem (AMBIEN) 5 MG tablet   Oral   Take 5 mg by mouth at bedtime as needed for sleep.         Marland Kitchen zolpidem (AMBIEN) 5 MG tablet      TAKE 1 TABLET BY MOUTH EVERY DAY AT BEDTIME AS NEEDED FOR SLEEP          BP 177/81  Pulse 93  Temp(Src) 98.2 F (36.8 C) (Oral)  Resp 16  Ht 5\' 4"  (1.626 m)  Wt 121 lb (54.885 kg)  BMI 20.76 kg/m2  SpO2 100% Physical Exam  Nursing note and vitals reviewed. Constitutional: She is oriented to person, place, and time. She appears well-developed and well-nourished.  HENT:  Head: Normocephalic and atraumatic.  Eyes: EOM are normal. Pupils are equal, round, and reactive to light.  Neck: Neck supple.  Cardiovascular: Normal rate, regular rhythm and normal heart sounds.   No murmur heard. Pulmonary/Chest: Effort normal. No respiratory distress.  Abdominal: Soft. She exhibits no distension. There is tenderness. There is no rebound and no guarding.  Diffuse lower quadrant abdominal tenderness - with guarding, and rebound  tenderness.  Neurological:  She is alert and oriented to person, place, and time.  Skin: Skin is warm and dry.    ED Course  Procedures (including critical care time) Labs Review Labs Reviewed  CBC WITH DIFFERENTIAL - Abnormal; Notable for the following:    WBC 14.0 (*)    RBC 6.34 (*)    Hemoglobin 19.9 (*)    HCT 55.5 (*)    Neutrophils Relative % 89 (*)    Neutro Abs 12.5 (*)    Lymphocytes Relative 6 (*)    All other components within normal limits  BASIC METABOLIC PANEL - Abnormal; Notable for the following:    Potassium 3.3 (*)    Chloride 93 (*)    Glucose, Bld 250 (*)    BUN 29 (*)    Creatinine, Ser 1.34 (*)    Calcium 11.0 (*)    GFR calc non Af Amer 39 (*)    GFR calc Af Amer 45 (*)    All other components within normal limits  URINALYSIS, ROUTINE W REFLEX MICROSCOPIC - Abnormal; Notable for the following:    APPearance CLOUDY (*)    Glucose, UA 100 (*)    Hgb urine dipstick SMALL (*)    Protein, ur >300 (*)    All other components within normal limits  URINE MICROSCOPIC-ADD ON - Abnormal; Notable for the following:    Squamous Epithelial / LPF FEW (*)    Casts GRANULAR CAST (*)    All other components within normal limits  I-STAT CG4 LACTIC ACID, ED - Abnormal; Notable for the following:    Lactic Acid, Venous 5.67 (*)    All other components within normal limits  URINE CULTURE  CULTURE, BLOOD (ROUTINE X 2)  CULTURE, BLOOD (ROUTINE X 2)  TROPONIN I  I-STAT CG4 LACTIC ACID, ED  I-STAT CG4 LACTIC ACID, ED   Imaging Review Dg Abd Acute W/chest  04/22/2013   CLINICAL DATA:  Emesis and dizziness, abdominal pain.  EXAM: ACUTE ABDOMEN SERIES (ABDOMEN 2 VIEW & CHEST 1 VIEW)  COMPARISON:  DG CHEST 2 VIEW dated 12/06/2012  FINDINGS: Cardiomediastinal silhouette is unremarkable. Mild increased lung volumes, with slight interstitial prominence, unchanged. Lungs are clear, no pleural effusions. No pneumothorax. Soft tissue planes and included osseous structures are  unremarkable.  Bowel gas pattern is nondilated and nonobstructive, however there is an overall paucity of bowel gas. Vascular calcifications left upper quadrant ,phleboliths in the pelvis. No intra-abdominal mass effect, pathologic calcifications or free air. Soft tissue planes and included osseous structures are nonsuspicious.  IMPRESSION: COPD without superimposed acute cardiopulmonary process.  Nonspecific bowel gas pattern with overall paucity of bowel gas.   Electronically Signed   By: Elon Alas   On: 04/22/2013 02:08   Ct Angio Abd/pel W/ And/or W/o  04/22/2013   CLINICAL DATA:  Assess for ischemic bowel. Generalized abdominal pain, nausea, vomiting and diarrhea.  EXAM: CT ANGIOGRAPHY ABDOMEN AND PELVIS WITH CONTRAST AND WITHOUT CONTRAST  TECHNIQUE: Multidetector CT imaging of the abdomen and pelvis was performed using the standard protocol during bolus administration of intravenous contrast. Multiplanar reconstructed images and MIPs were obtained and reviewed to evaluate the vascular anatomy.  CONTRAST:  20mL OMNIPAQUE IOHEXOL 350 MG/ML SOLN  COMPARISON:  DG ABD ACUTE W/CHEST dated 04/22/2013; CT ABD/PELV WO CM dated 12/05/2012  FINDINGS: Included view of the lung bases demonstrates minimal atelectasis or scarring. Apparent left ventricle hypertrophy, heart size is overall normal.  Abdominal aorta is normal in caliber with moderate calcific atherosclerosis. Chronic  appearing infrarenal distal aortic dissection with calcification at the level of the inferior mesenteric artery which appears patent. No aneurysm, suspicious luminal irregularity, periaortic fluid collections or contrast extravasation. The left gastric artery appears to arise directly from the aorta. Celiac trunk, superior mesenteric arteries are widely patent. No mesenteric arterial filling defects, high-grade stenoses. Mild calcific atherosclerosis of the ostia bilateral renal arteries which are widely patent. Left Common iliac artery  chronic dissection without flow-limiting stenosis. The right internal iliac artery is occluded within 1 cm of the origin, there is reconstitution distally with irregular appearance, which may reflect collateralization, Axial 127/205.  Patchy hypodense within the right lobe of the liver may reflect fatty infiltration, the liver is otherwise unremarkable. Less conspicuous appearance of the gallbladder sludge seen on prior CT. Spleen, pancreas and adrenal glands are nonsuspicious. Too small to characterize hypodensity in right interpolar kidney. 2 mm nonobstructing right lower pole renal calculus again seen. No hydronephrosis.  Limited assessment of the bowel due to lack of enteric contrast and overall paucity of intra-abdominal fat. However, the stomach, small large bowel overall normal course and caliber. Suspected sigmoid diverticulosis without superimposed inflammatory changes. No intraperitoneal free fluid or free air. The appendix is not discretely identified, however there are no inflammatory change of the right lower quadrant.  Status post hysterectomy. The urinary bladder is under distended unremarkable. Phleboliths in the pelvis. Patient is osteopenic. Moderate L2-3 degenerative disc disease. Motion through the lower thorax results in spurious artifact fracture of T11.  Review of the MIP images confirms the above findings.  IMPRESSION: No CT angiographic findings of mesenteric arterial occlusion/high-grade stenosis or ischemic bowel. No acute intra-abdominal or pelvic process.  Chronic infrarenal aortic dissection, with left Common iliac artery chronic dissection without flow limiting stenosis. 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   Electronically Signed   By: Elon Alas   On: 04/22/2013 03:27     EKG Interpretation None      MDM   Final diagnoses:  None    Pt comes in with cc of abd pain.  DDx includes: Aortic Dissection Bowel  ischemia Perforated viscus Renal stones AAA Tumors Colitis Intra abdominal abscess Thrombosis Mesenteric ischemia Diverticulitis Peritonitis Appendicitis Hernia Nephrolithiasis Pyelonephritis UTI/Cystitis  Labs show leukocytosis, lactate is > 5. Broad spectrum antibiotics started. AAS showed no free air CT angio was ordered, as the pain is out of proportion - and it shows chronic dissections, andno acute abd process. Repeat exam shows -still soft abdomen, tenderness is present, but pt feels a lot better. Repeat lactate is ordered. Po challenge to be started.  Varney Biles, MD 04/22/13 [phone]  6:31 AM Pt 's exam even more improved. Lactate cleared. Pt feels a lot better, and wants to go home. PO meds ordered. Will reassess one more time, if better, will discharge.   Varney Biles, MD 04/22/13 0347  8:09 AM PO challenge failed. Pt feels nervous about going home, as her pain is still not completely resolved. Will admit as observation.  Varney Biles, MD 04/22/13 [phone]

## 2013-04-22 NOTE — ED Notes (Signed)
Patient attempted to void, but was unsuccessful.

## 2013-04-23 DIAGNOSIS — R112 Nausea with vomiting, unspecified: Secondary | ICD-10-CM

## 2013-04-23 DIAGNOSIS — I10 Essential (primary) hypertension: Secondary | ICD-10-CM

## 2013-04-23 DIAGNOSIS — A088 Other specified intestinal infections: Secondary | ICD-10-CM

## 2013-04-23 LAB — CBC
HEMATOCRIT: 43.2 % (ref 36.0–46.0)
Hemoglobin: 14.1 g/dL (ref 12.0–15.0)
MCH: 29.9 pg (ref 26.0–34.0)
MCHC: 32.6 g/dL (ref 30.0–36.0)
MCV: 91.7 fL (ref 78.0–100.0)
Platelets: 157 10*3/uL (ref 150–400)
RBC: 4.71 MIL/uL (ref 3.87–5.11)
RDW: 15.6 % — AB (ref 11.5–15.5)
WBC: 9 10*3/uL (ref 4.0–10.5)

## 2013-04-23 LAB — COMPREHENSIVE METABOLIC PANEL
ALBUMIN: 3.9 g/dL (ref 3.5–5.2)
ALK PHOS: 50 U/L (ref 39–117)
ALT: 26 U/L (ref 0–35)
AST: 40 U/L — ABNORMAL HIGH (ref 0–37)
BUN: 25 mg/dL — ABNORMAL HIGH (ref 6–23)
CO2: 24 mEq/L (ref 19–32)
Calcium: 9 mg/dL (ref 8.4–10.5)
Chloride: 104 mEq/L (ref 96–112)
Creatinine, Ser: 0.85 mg/dL (ref 0.50–1.10)
GFR calc non Af Amer: 67 mL/min — ABNORMAL LOW (ref >90)
GFR, EST AFRICAN AMERICAN: 78 mL/min — AB (ref >90)
Glucose, Bld: 94 mg/dL (ref 70–99)
POTASSIUM: 3.2 meq/L — AB (ref 3.7–5.3)
Sodium: 142 mEq/L (ref 137–147)
TOTAL PROTEIN: 7.1 g/dL (ref 6.0–8.3)
Total Bilirubin: 0.6 mg/dL (ref 0.3–1.2)

## 2013-04-23 LAB — URINE CULTURE: Colony Count: >=100000

## 2013-04-23 MED ORDER — PANTOPRAZOLE SODIUM 40 MG PO TBEC: 40.0000 mg | DELAYED_RELEASE_TABLET | Freq: Every day | ORAL | Status: DC

## 2013-04-23 NOTE — Care Management Note (Signed)
    Page 1 of 1   04/23/2013     4:00:15 PM   CARE MANAGEMENT NOTE 04/23/2013  Patient:  [name], [name]   Account Number:  [id number]  Date Initiated:  04/23/2013  Documentation initiated by:  Tomi Bamberger  Subjective/Objective Assessment:   dx viral gastroenteritis  admit- lives with family,     Action/Plan:   advance diet   Anticipated DC Date:  04/23/2013   Anticipated DC Plan:  [place]  CM consult      Choice offered to / List presented to:             Status of service:  Completed, signed off Medicare Important Message given?   (If response is "NO", the following Medicare IM given date fields will be blank) Date Medicare IM given:   Date Additional Medicare IM given:    Discharge Disposition:  HOME/SELF CARE  Per UR Regulation:  Reviewed for med. necessity/level of care/duration of stay  If discussed at [place] of Stay Meetings, dates discussed:    Comments:

## 2013-04-23 NOTE — Progress Notes (Signed)
UR completed 

## 2013-04-23 NOTE — Progress Notes (Signed)
Patient discharged to home.  Patient alert, oriented, verbally responsive, breathing regular and non-labored throughout, no s/s of distress noted throughout, no c/o pain throughout.  Discharge instructions verbalized to patient and son at bedside thoroughly.  Both verbalized understanding throughout.  Patient left unit per wheelchair accompanied by son and Tiffany CNA.  VS WNL.    Dirk Dress 04/23/2013

## 2013-04-23 NOTE — Progress Notes (Signed)
Pt w/o complaints of nausea/vomiting this shift.

## 2013-04-23 NOTE — Discharge Summary (Signed)
PATIENT DETAILS Name: [name] Age: 73 y.o. Sex: female Date of Birth: 07/31/1940 MRN: [id number]. Admit Date: 04/22/2013 Admitting Physician: Caren Griffins, MD VVO:HYWVP Jenny Reichmann, MD  Recommendations for Outpatient Follow-up:  1. Please follow blood culture results till final-negative at the time of discharge 2. Please refer to vascular surgery for chronic infrarenal aortic dissection-case was discussed with Dr. Bridgett Larsson over the phone.  PRIMARY DISCHARGE DIAGNOSIS:  Principal Problem:   Abdominal pain Active Problems:   HYPERLIPIDEMIA   CORONARY ARTERY DISEASE   CAROTID ARTERY STENOSIS, BILATERAL   COPD   GERD   Smoker   Essential hypertension, benign   Acute renal failure   Protein-calorie malnutrition, severe   Viral gastroenteritis      PAST MEDICAL HISTORY: Past Medical History  Diagnosis Date  . THYROID NODULE, RIGHT 02/01/2009  . GLUCOSE INTOLERANCE 11/04/2007  . HYPERLIPIDEMIA 02/12/2007  . HYPOKALEMIA 11/04/2007  . ANXIETY 02/12/2007  . DEPRESSION 02/12/2007  . HYPERTENSION 11/07/2006  . CORONARY ARTERY DISEASE 11/04/2007  . CAROTID ARTERY STENOSIS, BILATERAL 02/24/2007  . Unspecified Peripheral Vascular Disease 02/12/2007  . ASTHMATIC BRONCHITIS, ACUTE 04/22/2008  . ALLERGIC RHINITIS 02/12/2007  . COPD 11/04/2007  . GERD 02/12/2007  . CONSTIPATION 02/12/2007  . DEGENERATIVE JOINT DISEASE, FINGERS 03/04/2008  . LOW BACK PAIN, CHRONIC 02/01/2009  . BACK PAIN, RIGHT 12/05/2009  . LEG PAIN, BILATERAL 02/01/2009  . OSTEOPOROSIS 02/12/2007  . DIZZINESS 02/12/2007  . INSOMNIA-SLEEP DISORDER-UNSPEC 08/04/2007  . FATIGUE 02/12/2007  . CHEST PAIN 02/12/2007  . Coronary artery disease 9/09    nonobstructive by cath  . Peripheral vascular disease 9/09    left carotid 60-80%   . Impaired glucose tolerance 05/11/2010  . Leg pain, bilateral 05/12/2010  . Syncope and collapse   . Essential hypertension, benign 06/17/2012    DISCHARGE MEDICATIONS:   Medication List         acetaminophen-codeine 300-30 MG per tablet  Commonly known as:  TYLENOL #3  Take 1 tablet by mouth every 6 (six) hours as needed for moderate pain.     alendronate 70 MG tablet  Commonly known as:  FOSAMAX  Take 70 mg by mouth every 7 (seven) days. Take with a full glass of water on an empty stomach.     aspirin 81 MG tablet  Take 81 mg by mouth daily.     atenolol 50 MG tablet  Commonly known as:  TENORMIN  Take 50 mg by mouth daily.     BC HEADACHE POWDER PO  Take 1 packet by mouth daily as needed (for pain).     benzonatate 100 MG capsule  Commonly known as:  TESSALON  Take 100-200 mg by mouth 3 (three) times daily as needed for cough.     cholecalciferol 1000 UNITS tablet  Commonly known as:  VITAMIN D  Take 1,000 Units by mouth daily.     cloNIDine 0.2 MG tablet  Commonly known as:  CATAPRES  Take 0.2 mg by mouth 2 (two) times daily.     feeding supplement (ENSURE COMPLETE) Liqd  Take 237 mLs by mouth every other day.     oxybutynin 10 MG 24 hr tablet  Commonly known as:  DITROPAN-XL  Take 10 mg by mouth daily.     pantoprazole 40 MG tablet  Commonly known as:  PROTONIX  Take 1 tablet (40 mg total) by mouth daily. Switch for any other PPI at similar dose and frequency     simvastatin 40 MG tablet  Commonly known as:  ZOCOR  Take 40 mg by mouth daily.     zolpidem 5 MG tablet  Commonly known as:  AMBIEN  Take 5 mg by mouth at bedtime as needed for sleep.        ALLERGIES:   Allergies  Allergen Reactions  . Tramadol     constipation    BRIEF HPI:  See H&P, Labs, Consult and Test reports for all details in brief, 73 y.o. female has a past medical history significant for COPD, coronary artery disease, tobacco abuse, hypertension, hyperlipidemia, presents to the emergency room with a chief complaint of abdominal pain.She also describes 2 episodes of diarrhea, nausea and vomiting.In the emergency room, on admission, she was found to have a leukocytosis of  14, initial lactic acid was 5 and she was started empirically on vancomycin and Zosyn. Chest x-ray was clear without any identifiable source. With hydration, lactic acid came back to normal levels, and patient was symptomatically feeling close to normal. She was given a trial of by mouth liquids with plans that if she tolerates she should be discharged home, however she was feeling very nauseous and unable to keep anything down. [facility] was then asked to admit this patient for further evaluation and treatment.  CONSULTATIONS:   None  PERTINENT RADIOLOGIC STUDIES: Dg Abd Acute W/chest  04/22/2013   CLINICAL DATA:  Emesis and dizziness, abdominal pain.  EXAM: ACUTE ABDOMEN SERIES (ABDOMEN 2 VIEW & CHEST 1 VIEW)  COMPARISON:  DG CHEST 2 VIEW dated 12/06/2012  FINDINGS: Cardiomediastinal silhouette is unremarkable. Mild increased lung volumes, with slight interstitial prominence, unchanged. Lungs are clear, no pleural effusions. No pneumothorax. Soft tissue planes and included osseous structures are unremarkable.  Bowel gas pattern is nondilated and nonobstructive, however there is an overall paucity of bowel gas. Vascular calcifications left upper quadrant ,phleboliths in the pelvis. No intra-abdominal mass effect, pathologic calcifications or free air. Soft tissue planes and included osseous structures are nonsuspicious.  IMPRESSION: COPD without superimposed acute cardiopulmonary process.  Nonspecific bowel gas pattern with overall paucity of bowel gas.   Electronically Signed   By: Awilda Metro   On: 04/22/2013 02:08   Ct Angio Abd/pel W/ And/or W/o  04/22/2013   CLINICAL DATA:  Assess for ischemic bowel. Generalized abdominal pain, nausea, vomiting and diarrhea.  EXAM: CT ANGIOGRAPHY ABDOMEN AND PELVIS WITH CONTRAST AND WITHOUT CONTRAST  TECHNIQUE: Multidetector CT imaging of the abdomen and pelvis was performed using the standard protocol during bolus administration of intravenous  contrast. Multiplanar reconstructed images and MIPs were obtained and reviewed to evaluate the vascular anatomy.  CONTRAST:  7mL OMNIPAQUE IOHEXOL 350 MG/ML SOLN  COMPARISON:  DG ABD ACUTE W/CHEST dated 04/22/2013; CT ABD/PELV WO CM dated 12/05/2012  FINDINGS: Included view of the lung bases demonstrates minimal atelectasis or scarring. Apparent left ventricle hypertrophy, heart size is overall normal.  Abdominal aorta is normal in caliber with moderate calcific atherosclerosis. Chronic appearing infrarenal distal aortic dissection with calcification at the level of the inferior mesenteric artery which appears patent. No aneurysm, suspicious luminal irregularity, periaortic fluid collections or contrast extravasation. The left gastric artery appears to arise directly from the aorta. Celiac trunk, superior mesenteric arteries are widely patent. No mesenteric arterial filling defects, high-grade stenoses. Mild calcific atherosclerosis of the ostia bilateral renal arteries which are widely patent. Left Common iliac artery chronic dissection without flow-limiting stenosis. The right internal iliac artery is occluded within 1 cm of the origin, there is reconstitution distally  with irregular appearance, which may reflect collateralization, Axial 127/205.  Patchy hypodense within the right lobe of the liver may reflect fatty infiltration, the liver is otherwise unremarkable. Less conspicuous appearance of the gallbladder sludge seen on prior CT. Spleen, pancreas and adrenal glands are nonsuspicious. Too small to characterize hypodensity in right interpolar kidney. 2 mm nonobstructing right lower pole renal calculus again seen. No hydronephrosis.  Limited assessment of the bowel due to lack of enteric contrast and overall paucity of intra-abdominal fat. However, the stomach, small large bowel overall normal course and caliber. Suspected sigmoid diverticulosis without superimposed inflammatory changes. No intraperitoneal free  fluid or free air. The appendix is not discretely identified, however there are no inflammatory change of the right lower quadrant.  Status post hysterectomy. The urinary bladder is under distended unremarkable. Phleboliths in the pelvis. Patient is osteopenic. Moderate L2-3 degenerative disc disease. Motion through the lower thorax results in spurious artifact fracture of T11.  Review of the MIP images confirms the above findings.  IMPRESSION: No CT angiographic findings of mesenteric arterial occlusion/high-grade stenosis or ischemic bowel. No acute intra-abdominal or pelvic process.  Chronic infrarenal aortic dissection, with left Common iliac artery chronic dissection without flow limiting stenosis. 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   Electronically Signed   By: Elon Alas   On: 04/22/2013 03:27     PERTINENT LAB RESULTS: CBC:  Recent Labs  04/22/13 0101 04/23/13 0750  WBC 14.0* 9.0  HGB 19.9* 14.1  HCT 55.5* 43.2  PLT 237 157   CMET CMP     Component Value Date/Time   NA 142 04/23/2013 0750   K 3.2* 04/23/2013 0750   CL 104 04/23/2013 0750   CO2 24 04/23/2013 0750   GLUCOSE 94 04/23/2013 0750   BUN 25* 04/23/2013 0750   CREATININE 0.85 04/23/2013 0750   CALCIUM 9.0 04/23/2013 0750   PROT 7.1 04/23/2013 0750   ALBUMIN 3.9 04/23/2013 0750   AST 40* 04/23/2013 0750   ALT 26 04/23/2013 0750   ALKPHOS 50 04/23/2013 0750   BILITOT 0.6 04/23/2013 0750   GFRNONAA 67* 04/23/2013 0750   GFRAA 78* 04/23/2013 0750    GFR Estimated Creatinine Clearance: 52.4 ml/min (by C-G formula based on Cr of 0.85). No results found for this basename: LIPASE, AMYLASE,  in the last 72 hours  Recent Labs  04/22/13 0101  TROPONINI <0.30   No components found with this basename: POCBNP,  No results found for this basename: DDIMER,  in the last 72 hours No results found for this basename: HGBA1C,  in the last 72 hours No results found for this  basename: CHOL, HDL, LDLCALC, TRIG, CHOLHDL, LDLDIRECT,  in the last 72 hours No results found for this basename: TSH, T4TOTAL, FREET3, T3FREE, THYROIDAB,  in the last 72 hours No results found for this basename: VITAMINB12, FOLATE, FERRITIN, TIBC, IRON, RETICCTPCT,  in the last 72 hours Coags: No results found for this basename: PT, INR,  in the last 72 hours Microbiology: Recent Results (from the past 240 hour(s))  CULTURE, BLOOD (ROUTINE X 2)     Status: None   Collection Time    04/22/13  3:10 AM      Result Value Ref Range Status   Specimen Description BLOOD RIGHT ARM   Final   Special Requests BOTTLES DRAWN AEROBIC AND ANAEROBIC 10CC   Final   Culture  Setup Time     Final   Value: 04/22/2013 08:40  Performed at [facility]     Final   Value:        BLOOD CULTURE RECEIVED NO GROWTH TO DATE CULTURE WILL BE HELD FOR 5 DAYS BEFORE ISSUING A FINAL NEGATIVE REPORT     Performed at [facility]   Report Status PENDING   Incomplete  CULTURE, BLOOD (ROUTINE X 2)     Status: None   Collection Time    04/22/13  3:28 AM      Result Value Ref Range Status   Specimen Description BLOOD LEFT FOREARM   Final   Special Requests BOTTLES DRAWN AEROBIC ONLY 10CC   Final   Culture  Setup Time     Final   Value: 04/22/2013 08:40     Performed at [facility]   Culture     Final   Value:        BLOOD CULTURE RECEIVED NO GROWTH TO DATE CULTURE WILL BE HELD FOR 5 DAYS BEFORE ISSUING A FINAL NEGATIVE REPORT     Performed at [facility]   Report Status PENDING   Incomplete  URINE CULTURE     Status: None   Collection Time    04/22/13  3:49 AM      Result Value Ref Range Status   Specimen Description URINE, CLEAN CATCH   Final   Special Requests NONE   Final   Culture  Setup Time     Final   Value: 04/22/2013 09:42     Performed at [facility] Count     Final   Value: >=100,000 COLONIES/ML     Performed at [facility]    Culture     Final   Value: Multiple bacterial morphotypes present, none predominant. Suggest appropriate recollection if clinically indicated.     Performed at [facility]   Report Status 04/23/2013 FINAL   Final     BRIEF HOSPITAL COURSE:   Principal Problem:   Abdominal pain nausea vomiting and diarrhea - Likely suspected viral syndrome. - Patient was admitted, given supportive care with IV fluids, antiemetics and narcotics. She was initially kept n.p.o., then slowly started on a clear liquid diet. She was then gently moved over to a soft diet which she has tolerated. She has no abdominal pain, she has some mild abdominal discomfort mostly at the epigastric region. Nausea, vomiting and diarrhea have all resolved. - Blood cultures obtained at the time of admission are negative so far. Please follow till final.  Active Problems: Chronic infrarenal aortic dissection - Case was discussed by ED M.D. on admission, subsequently by this M.D. on the day of discharge. Dr. Bridgett Larsson recommends outpatient followup, I have made an appointment with Dr. Bridgett Larsson on April 24. I've asked the patient to keep this appointment. At this time further workup deferred to the outpatient setting, suspect this was an incidental finding. - Continue aggressive blood pressure control and statins.    HYPERLIPIDEMIA - Continue statins, we'll need aggressive lipid control given above issues.    CORONARY ARTERY DISEASE - Stable, continue with aspirin, statin and beta blocker.  Rest of her medical issues were stable during this short hospitalization  TODAY-DAY OF DISCHARGE:  Subjective:   [name] today has no headache,no chest abdominal pain,no new weakness tingling or numbness, feels much better wants to go home today.   Objective:   Blood pressure 132/64, pulse 54, temperature 97.8 F (36.6 C), temperature source Oral,  resp. rate 20, height 5\' 4"  (1.626 m), weight 54.885 kg (121 lb), SpO2  100.00%.  Intake/Output Summary (Last 24 hours) at 04/23/13 1539 Last data filed at 04/23/13 1136  Gross per 24 hour  Intake 2460.5 ml  Output      0 ml  Net 2460.5 ml   Filed Weights   04/22/13 0051  Weight: 54.885 kg (121 lb)    Exam Awake Alert, Oriented *3, No new F.N deficits, Normal affect [place].AT,PERRAL Supple Neck,No JVD, No cervical lymphadenopathy appriciated.  Symmetrical Chest wall movement, Good air movement bilaterally, CTAB RRR,No Gallops,Rubs or new Murmurs, No Parasternal Heave +ve B.Sounds, Abd Soft, Non tender, No organomegaly appriciated, No rebound -guarding or rigidity. No Cyanosis, Clubbing or edema, No new Rash or bruise  DISCHARGE CONDITION: Stable  DISPOSITION: Home  DISCHARGE INSTRUCTIONS:    Activity:  As tolerated   Diet recommendation: Heart Healthy diet   Discharge Orders   Future Appointments Provider Department Dept Phone   05/22/2013 8:30 AM Conrad [place], MD [facility] [phone]   07/08/2013 10:00 AM Biagio Borg, MD [place] [phone]   Future Orders Complete By Expires   Call MD for:  persistant nausea and vomiting  As directed    Call MD for:  severe uncontrolled pain  As directed    Diet - low sodium heart healthy  As directed    Increase activity slowly  As directed       Follow-up Information   Follow up with Cathlean Cower, MD. Schedule an appointment as soon as possible for a visit in 1 week.   Specialties:  Internal Medicine, Radiology   Contact information:   [place] [address] [phone]       Follow up with Hinda Lenis, MD On 05/22/2013. (at 8:30 am)    Specialty:  Vascular Surgery   Contact information:   [place] [address] [phone]         Total Time spent on discharge equals 45 minutes.  SignedOren Binet 04/23/2013 3:39 PM

## 2013-04-28 LAB — CULTURE, BLOOD (ROUTINE X 2)
CULTURE: NO GROWTH
Culture: NO GROWTH

## 2013-05-08 ENCOUNTER — Ambulatory Visit: Payer: Medicare Other | Admitting: Internal Medicine

## 2013-05-21 ENCOUNTER — Encounter: Payer: Self-pay | Admitting: Vascular Surgery

## 2013-05-22 ENCOUNTER — Ambulatory Visit (INDEPENDENT_AMBULATORY_CARE_PROVIDER_SITE_OTHER): Payer: Medicare Other | Admitting: Vascular Surgery

## 2013-05-22 ENCOUNTER — Encounter: Payer: Self-pay | Admitting: Vascular Surgery

## 2013-05-22 VITALS — BP 151/74 | HR 68 | Ht 64.0 in | Wt 114.2 lb

## 2013-05-22 DIAGNOSIS — I7100 Dissection of unspecified site of aorta: Secondary | ICD-10-CM

## 2013-05-22 DIAGNOSIS — M79609 Pain in unspecified limb: Secondary | ICD-10-CM

## 2013-05-22 DIAGNOSIS — M79605 Pain in left leg: Secondary | ICD-10-CM

## 2013-05-22 DIAGNOSIS — M79604 Pain in right leg: Secondary | ICD-10-CM

## 2013-05-22 NOTE — Progress Notes (Signed)
[facility]  Referred by:  Biagio Borg, MD [address] [place], [place] [address]  Reason for referral: aortic dissection  History of Present Illness  [name] is a 73 y.o. ([birth year]) female who presents with chief complaint: right calf pain.  This patient was recently admitted to hospital with intractable nausea and vomitting.  As part of her work-up, she underwent a CT abd/pelvis which incidentally found a chronic aortic dissection in the distal aorta and left common iliac artery.  The patient notes long standing R calf cramping with ambulation.  She also note "hot sensation" in the right calf when this occurs.  She denies rest pain or any wounds or gangrene.  Her atherosclerotic risks include: HLD, HTN, active smoker.  Additionally, this patient previous had a R CEA done by Dr. Donnetta Hutching.  She has been lost to follow up.  Past Medical History  Diagnosis Date  . THYROID NODULE, RIGHT 02/01/2009  . GLUCOSE INTOLERANCE 11/04/2007  . HYPERLIPIDEMIA 02/12/2007  . HYPOKALEMIA 11/04/2007  . ANXIETY 02/12/2007  . DEPRESSION 02/12/2007  . HYPERTENSION 11/07/2006  . CORONARY ARTERY DISEASE 11/04/2007  . CAROTID ARTERY STENOSIS, BILATERAL 02/24/2007  . Unspecified Peripheral Vascular Disease 02/12/2007  . ASTHMATIC BRONCHITIS, ACUTE 04/22/2008  . ALLERGIC RHINITIS 02/12/2007  . COPD 11/04/2007  . GERD 02/12/2007  . CONSTIPATION 02/12/2007  . DEGENERATIVE JOINT DISEASE, FINGERS 03/04/2008  . LOW BACK PAIN, CHRONIC 02/01/2009  . BACK PAIN, RIGHT 12/05/2009  . LEG PAIN, BILATERAL 02/01/2009  . OSTEOPOROSIS 02/12/2007  . DIZZINESS 02/12/2007  . INSOMNIA-SLEEP DISORDER-UNSPEC 08/04/2007  . FATIGUE 02/12/2007  . CHEST PAIN 02/12/2007  . Coronary artery disease 9/09    nonobstructive by cath  . Peripheral vascular disease 9/09    left carotid 60-80%   . Impaired glucose tolerance 05/11/2010  . Leg pain, bilateral 05/12/2010  . Syncope and collapse   . Essential hypertension,  benign 06/17/2012    Past Surgical History  Procedure Laterality Date  . S/p bowel obstruction    . Abdominal hysterectomy    . Appendectomy    . Oophorectomy    . Tonsillectomy    . S/p right cea  2/09  . S/p right thyroid nodule biopsy  March 2011    negative  . Carotid endarterectomy    . Cardiac catheterization      History   Social History  . Marital Status: Widowed    Spouse Name: N/A    Number of Children: 2  . Years of Education: N/A   Occupational History  . 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 Smoking status: Current Every Day Smoker -- 0.50 packs/day  . Smokeless tobacco: Never Used  . Alcohol Use: Yes     Comment: beer occasionally  . Drug Use: No  . Sexual Activity: Not on file   Other Topics Concern  . Not on file   Social History Narrative  . No narrative on file    Family History  Problem Relation Age of Onset  . Cancer Son     Possible colon cancer  . Heart attack Other   . Stroke Other   . Alcohol abuse Other   . Diabetes Other   . Hypertension Other    Current Outpatient Prescriptions on File Prior to Visit  Medication Sig Dispense Refill  . aspirin 81 MG tablet Take 81 mg by mouth daily.      . cholecalciferol (  VITAMIN D) 1000 UNITS tablet Take 1,000 Units by mouth daily.       . cloNIDine (CATAPRES) 0.2 MG tablet Take 0.2 mg by mouth 2 (two) times daily.      . simvastatin (ZOCOR) 40 MG tablet Take 40 mg by mouth daily.      Marland Kitchen zolpidem (AMBIEN) 5 MG tablet Take 5 mg by mouth at bedtime as needed for sleep.      Marland Kitchen acetaminophen-codeine (TYLENOL #3) 300-30 MG per tablet Take 1 tablet by mouth every 6 (six) hours as needed for moderate pain.      Marland Kitchen alendronate (FOSAMAX) 70 MG tablet Take 70 mg by mouth every 7 (seven) days. Take with a full glass of water on an empty stomach.      . Aspirin-Salicylamide-Caffeine (BC HEADACHE POWDER PO) Take 1 packet by mouth daily as needed (for pain).      Marland Kitchen  atenolol (TENORMIN) 50 MG tablet Take 50 mg by mouth daily.      . benzonatate (TESSALON) 100 MG capsule Take 100-200 mg by mouth 3 (three) times daily as needed for cough.      . feeding supplement, ENSURE COMPLETE, (ENSURE COMPLETE) LIQD Take 237 mLs by mouth every other day.      . oxybutynin (DITROPAN-XL) 10 MG 24 hr tablet Take 10 mg by mouth daily.      . pantoprazole (PROTONIX) 40 MG tablet Take 1 tablet (40 mg total) by mouth daily. Switch for any other PPI at similar dose and frequency  30 tablet  0   No current facility-administered medications on file prior to visit.    Allergies  Allergen Reactions  . Tramadol     constipation    REVIEW OF SYSTEMS:  (Positives checked otherwise negative)  CARDIOVASCULAR:  []  chest pain, []  chest pressure, []  palpitations, []  shortness of breath when laying flat, []  shortness of breath with exertion,  [x]  pain in calf when walking, []  pain in feet when laying flat, []  history of blood clot in veins (DVT), []  history of phlebitis, []  swelling in legs, []  varicose veins  PULMONARY:  []  productive cough, []  asthma, []  wheezing  NEUROLOGIC:  []  weakness in arms or legs, []  numbness in arms or legs, []  difficulty speaking or slurred speech, []  temporary loss of vision in one eye, []  dizziness  HEMATOLOGIC:  []  bleeding problems, []  problems with blood clotting too easily  MUSCULOSKEL:  []  joint pain, []  joint swelling  GASTROINTEST:  []  vomiting blood, []  blood in stool     GENITOURINARY:  []  burning with urination, []  blood in urine  PSYCHIATRIC:  []  history of major depression  INTEGUMENTARY:  []  rashes, []  ulcers  CONSTITUTIONAL:  []  fever, []  chills  For VQI Use Only  PRE-ADM LIVING: Home  AMB STATUS: Ambulatory  CAD Sx: None  PRIOR CHF: None  STRESS TEST: [x]  No, [ ]  Normal, [ ]  + ischemia, [ ]  + MI, [ ]  Both  Physical Examination  Filed Vitals:   05/22/13 0850  BP: 151/74  Pulse: 68  Height: 5\' 4"  (1.626 m)  Weight:  114 lb 3.2 oz (51.801 kg)  SpO2: 98%    Body mass index is 19.59 kg/(m^2).  General: A&O x 3, WD, thin  Head: [place]/AT  Ear/Nose/Throat: Hearing grossly intact, nares w/o erythema or drainage, oropharynx w/o Erythema/Exudate, Mallampati score: 3  Eyes: PERRLA, EOMI  Neck: Supple, no nuchal rigidity, no palpable LAD  Pulmonary: Sym exp, good air movt, CTAB,  no rales, rhonchi, & wheezing  Cardiac: RRR, Nl S1, S2, no Murmurs, rubs or gallops  Vascular: Vessel Right Left  Radial Palpable Palpable  Ulnar Not Palpable Not Palpable  Brachial Palpable Palpable  Carotid Palpable, without bruit Palpable, without bruit  Aorta Not palpable N/A  Femoral Palpable Palpable  Popliteal Not palpable Not palpable  PT Faintly Palpable  Palpable  DP Faintly Palpable Faintly  Palpable   Gastrointestinal: soft, NTND, -G/R, - HSM, - masses, - CVAT B  Musculoskeletal: M/S 5/5 throughout , Extremities without ischemic changes   Neurologic: CN 2-12 intact , Pain and light touch intact in extremities , Motor exam as listed above  Psychiatric: Judgment intact, Mood & affect appropriate for pt's clinical situation  Dermatologic: See M/S exam for extremity exam, no rashes otherwise noted  Lymph : No Cervical, Axillary, or Inguinal lymphadenopathy   CTA Abd/pelvis (04/22/13)  No CT angiographic findings of mesenteric arterial occlusion/high-grade stenosis or ischemic bowel.   No acute intra-abdominal or pelvic process.   Chronic infrarenal aortic dissection, with left Common iliac artery chronic dissection without flow limiting stenosis.   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   I reviewed the CTA abd/pelvis: no convincing evidence of significant aortic or iliac dissection.  Intact aortoiliac flow with scattered calcific atherosclerosis.  Outside Studies/Documentation 10 pages of outside documents were reviewed including: inpatient admission  notes.  Medical Decision Making  [name] is a 73 y.o. female who presents with: possible aortic and iliac dissection, possible RLE claudication of unknown etiology.   Pt's sx are not c/w classic intermittent claudication.  Given the substantial atherosclerotic burden in the aortoiliac segments, I would obtain a BLE ABI to check the legs.  I don't see a clear aortic or iliac dissection, suggesting at most minimal hemodynamic impact of such.  There is robust blood flow in the distal aorta and iliac segments.  I doubt any benefit to definitive evaluation with aortography.  I discussed in depth with the patient the nature of atherosclerosis, and emphasized the importance of maximal medical management including strict control of blood pressure, blood glucose, and lipid levels, antiplatelet agents, obtaining regular exercise, and cessation of smoking.    The patient is aware that without maximal medical management the underlying atherosclerotic disease process will progress, limiting the benefit of any interventions. The patient will follow up in 4 weeks with the ABI The patient is currently on a statin: Zocor. The patient is currently on an anti-platelet: ASA.  Thank you for allowing [place] to participate in this patient's care.  Adele Barthel, MD [facility] Office: [phone] Pager: [phone]  05/22/2013, 9:06 AM

## 2013-05-22 NOTE — Addendum Note (Signed)
Addended by: Mena Goes on: 05/22/2013 05:14 PM   Modules accepted: Orders

## 2013-05-25 NOTE — Addendum Note (Signed)
Addended by: Mena Goes on: 05/25/2013 10:08 AM   Modules accepted: Orders

## 2013-06-18 ENCOUNTER — Encounter: Payer: Self-pay | Admitting: Vascular Surgery

## 2013-06-19 ENCOUNTER — Ambulatory Visit: Payer: Medicare Other | Admitting: Vascular Surgery

## 2013-06-19 ENCOUNTER — Inpatient Hospital Stay (HOSPITAL_COMMUNITY): Admission: RE | Admit: 2013-06-19 | Payer: Medicare Other | Source: Ambulatory Visit

## 2013-07-08 ENCOUNTER — Ambulatory Visit: Payer: Medicare Other | Admitting: Internal Medicine

## 2013-07-08 DIAGNOSIS — Z0289 Encounter for other administrative examinations: Secondary | ICD-10-CM

## 2013-07-16 ENCOUNTER — Telehealth: Payer: Self-pay | Admitting: Internal Medicine

## 2013-07-16 ENCOUNTER — Other Ambulatory Visit: Payer: Self-pay | Admitting: Internal Medicine

## 2013-07-16 NOTE — Telephone Encounter (Signed)
Called the patient to inform [facility] did contact for refill on Simvastatin and Clonidine.  Did try to call the patient to inform but the number in the chart had been disconnected.

## 2013-07-16 NOTE — Telephone Encounter (Signed)
Patients son states that they dropped two bottles off to [facility] to get refills. . .  Not aware of which meds they are.  They believe one is a high blood pressure med.  They state [facility] will be sending a request in.  Just want to make you aware bc patient is out of those meds.

## 2013-07-30 ENCOUNTER — Telehealth: Payer: Self-pay | Admitting: Internal Medicine

## 2013-07-30 ENCOUNTER — Ambulatory Visit: Payer: Medicare Other | Admitting: Internal Medicine

## 2013-07-30 DIAGNOSIS — Z0289 Encounter for other administrative examinations: Secondary | ICD-10-CM

## 2013-07-30 NOTE — Telephone Encounter (Signed)
Note:  Total time for pt hx, exam, review of record with pt in the room, determination of diagnoses and plan for further eval and tx is > 40 min, with over 50% spent in coordination and counseling of patient

## 2013-07-30 NOTE — Telephone Encounter (Signed)
Your pt did not show up for the appt today. Please advise.

## 2013-09-15 ENCOUNTER — Emergency Department (HOSPITAL_COMMUNITY): Payer: Medicare Other

## 2013-09-15 ENCOUNTER — Encounter (HOSPITAL_COMMUNITY): Payer: Self-pay | Admitting: Emergency Medicine

## 2013-09-15 ENCOUNTER — Emergency Department (HOSPITAL_COMMUNITY)
Admission: EM | Admit: 2013-09-15 | Discharge: 2013-09-15 | Disposition: A | Discharge status: Home / Self Care | Payer: Medicare Other | Attending: Emergency Medicine | Admitting: Emergency Medicine

## 2013-09-15 DIAGNOSIS — F411 Generalized anxiety disorder: Secondary | ICD-10-CM | POA: Diagnosis not present

## 2013-09-15 DIAGNOSIS — I160 Hypertensive urgency: Secondary | ICD-10-CM

## 2013-09-15 DIAGNOSIS — Z79899 Other long term (current) drug therapy: Secondary | ICD-10-CM | POA: Diagnosis not present

## 2013-09-15 DIAGNOSIS — I2510 Atherosclerotic heart disease of native coronary artery without angina pectoris: Secondary | ICD-10-CM | POA: Insufficient documentation

## 2013-09-15 DIAGNOSIS — I10 Essential (primary) hypertension: Secondary | ICD-10-CM | POA: Diagnosis not present

## 2013-09-15 DIAGNOSIS — R5383 Other fatigue: Secondary | ICD-10-CM

## 2013-09-15 DIAGNOSIS — E785 Hyperlipidemia, unspecified: Secondary | ICD-10-CM | POA: Insufficient documentation

## 2013-09-15 DIAGNOSIS — Z7983 Long term (current) use of bisphosphonates: Secondary | ICD-10-CM | POA: Insufficient documentation

## 2013-09-15 DIAGNOSIS — R112 Nausea with vomiting, unspecified: Secondary | ICD-10-CM | POA: Diagnosis not present

## 2013-09-15 DIAGNOSIS — G8929 Other chronic pain: Secondary | ICD-10-CM | POA: Diagnosis not present

## 2013-09-15 DIAGNOSIS — R5381 Other malaise: Secondary | ICD-10-CM | POA: Insufficient documentation

## 2013-09-15 DIAGNOSIS — J449 Chronic obstructive pulmonary disease, unspecified: Secondary | ICD-10-CM | POA: Insufficient documentation

## 2013-09-15 DIAGNOSIS — F329 Major depressive disorder, single episode, unspecified: Secondary | ICD-10-CM | POA: Insufficient documentation

## 2013-09-15 DIAGNOSIS — J4489 Other specified chronic obstructive pulmonary disease: Secondary | ICD-10-CM | POA: Insufficient documentation

## 2013-09-15 DIAGNOSIS — Z794 Long term (current) use of insulin: Secondary | ICD-10-CM | POA: Insufficient documentation

## 2013-09-15 DIAGNOSIS — F17200 Nicotine dependence, unspecified, uncomplicated: Secondary | ICD-10-CM | POA: Diagnosis not present

## 2013-09-15 DIAGNOSIS — M810 Age-related osteoporosis without current pathological fracture: Secondary | ICD-10-CM | POA: Diagnosis not present

## 2013-09-15 DIAGNOSIS — Z9889 Other specified postprocedural states: Secondary | ICD-10-CM | POA: Insufficient documentation

## 2013-09-15 DIAGNOSIS — K219 Gastro-esophageal reflux disease without esophagitis: Secondary | ICD-10-CM | POA: Insufficient documentation

## 2013-09-15 DIAGNOSIS — F3289 Other specified depressive episodes: Secondary | ICD-10-CM | POA: Insufficient documentation

## 2013-09-15 DIAGNOSIS — R1084 Generalized abdominal pain: Secondary | ICD-10-CM

## 2013-09-15 LAB — BASIC METABOLIC PANEL
ANION GAP: 19 — AB (ref 5–15)
BUN: 20 mg/dL (ref 6–23)
CHLORIDE: 97 meq/L (ref 96–112)
CO2: 28 meq/L (ref 19–32)
Calcium: 10.5 mg/dL (ref 8.4–10.5)
Creatinine, Ser: 1.02 mg/dL (ref 0.50–1.10)
GFR calc Af Amer: 63 mL/min — ABNORMAL LOW (ref >90)
GFR calc non Af Amer: 54 mL/min — ABNORMAL LOW (ref >90)
GLUCOSE: 209 mg/dL — AB (ref 70–99)
Potassium: 3.1 mEq/L — ABNORMAL LOW (ref 3.7–5.3)
Sodium: 144 mEq/L (ref 137–147)

## 2013-09-15 LAB — CBC WITH DIFFERENTIAL/PLATELET
BASOS ABS: 0 10*3/uL (ref 0.0–0.1)
Basophils Relative: 0 % (ref 0–1)
EOS PCT: 0 % (ref 0–5)
Eosinophils Absolute: 0 10*3/uL (ref 0.0–0.7)
HEMATOCRIT: 51.3 % — AB (ref 36.0–46.0)
Hemoglobin: 17.6 g/dL — ABNORMAL HIGH (ref 12.0–15.0)
LYMPHS ABS: 0.6 10*3/uL — AB (ref 0.7–4.0)
LYMPHS PCT: 6 % — AB (ref 12–46)
MCH: 30.3 pg (ref 26.0–34.0)
MCHC: 34.3 g/dL (ref 30.0–36.0)
MCV: 88.4 fL (ref 78.0–100.0)
MONOS PCT: 4 % (ref 3–12)
Monocytes Absolute: 0.4 10*3/uL (ref 0.1–1.0)
NEUTROS ABS: 10.1 10*3/uL — AB (ref 1.7–7.7)
Neutrophils Relative %: 90 % — ABNORMAL HIGH (ref 43–77)
Platelets: 231 10*3/uL (ref 150–400)
RBC: 5.8 MIL/uL — AB (ref 3.87–5.11)
RDW: 14.8 % (ref 11.5–15.5)
WBC: 11.2 10*3/uL — ABNORMAL HIGH (ref 4.0–10.5)

## 2013-09-15 LAB — I-STAT TROPONIN, ED: Troponin i, poc: 0.01 ng/mL (ref 0.00–0.08)

## 2013-09-15 LAB — I-STAT CG4 LACTIC ACID, ED: Lactic Acid, Venous: 2.13 mmol/L (ref 0.5–2.2)

## 2013-09-15 MED ORDER — HYDROMORPHONE HCL PF 1 MG/ML IJ SOLN
0.5000 mg | Freq: Once | INTRAMUSCULAR | Status: AC
  Administered 2013-09-15: 0.5 mg via INTRAVENOUS
  Filled 2013-09-15: qty 1

## 2013-09-15 MED ORDER — LABETALOL HCL 5 MG/ML IV SOLN
20.0000 mg | Freq: Once | INTRAVENOUS | Status: AC
  Administered 2013-09-15: 20 mg via INTRAVENOUS
  Filled 2013-09-15: qty 4

## 2013-09-15 MED ORDER — DICYCLOMINE HCL 20 MG PO TABS: 20.0000 mg | ORAL_TABLET | Freq: Four times a day (QID) | ORAL | Status: DC | PRN

## 2013-09-15 MED ORDER — ONDANSETRON 8 MG PO TBDP: 8.0000 mg | ORAL_TABLET | Freq: Three times a day (TID) | ORAL | Status: DC | PRN

## 2013-09-15 MED ORDER — IOHEXOL 350 MG/ML SOLN
100.0000 mL | Freq: Once | INTRAVENOUS | Status: AC | PRN
  Administered 2013-09-15: 100 mL via INTRAVENOUS

## 2013-09-15 NOTE — ED Notes (Signed)
Pt brought to ED by [facility], for c/o nausea and vomiting. On EMS arrival pt having a BP of 244/112, having some nausea and HA 9/10. Pt states she had like 5 episode of vomiting at home. 4mg  of Zofran IV given by EMS, EKG by [facility] ST. Pt having some tremors on her hands at this time, no distress noticed. Pt denies changes on BM or urine.

## 2013-09-15 NOTE — ED Notes (Signed)
Pt given Sprite. Tolerating well.

## 2013-09-15 NOTE — ED Provider Notes (Signed)
CSN: [id number]     Arrival date & time 09/15/13  5277 History   First MD Initiated Contact with Patient 09/15/13 [phone]     Chief Complaint  Patient presents with  . Fatigue  . Hypertension  . Headache  . Nausea  . Emesis     (Consider location/radiation/quality/duration/timing/severity/associated sxs/prior Treatment) HPI 73 year old female presents to emergency room from home via EMS with complaint of diffuse abdominal pain, nausea and vomiting.  Patient reports symptoms started earlier in the day and progressively got worse.  She denies any diarrhea.  She is unsure when her last bowel movement was.  Patient noted to be significantly hypertensive with EMS.  Patient began complaining of headache upon EMS arrival.  Patient reports she's been compliant with her blood pressure medications although she cannot tell me what she takes.  Prior records reviewed, patient has history of coronary disease, peripheral vascular disease, stable aortic infrarenal dissection, hypertension COPD. Past Medical History  Diagnosis Date  . THYROID NODULE, RIGHT 02/01/2009  . GLUCOSE INTOLERANCE 11/04/2007  . HYPERLIPIDEMIA 02/12/2007  . HYPOKALEMIA 11/04/2007  . ANXIETY 02/12/2007  . DEPRESSION 02/12/2007  . HYPERTENSION 11/07/2006  . CORONARY ARTERY DISEASE 11/04/2007  . CAROTID ARTERY STENOSIS, BILATERAL 02/24/2007  . Unspecified Peripheral Vascular Disease 02/12/2007  . ASTHMATIC BRONCHITIS, ACUTE 04/22/2008  . ALLERGIC RHINITIS 02/12/2007  . COPD 11/04/2007  . GERD 02/12/2007  . CONSTIPATION 02/12/2007  . DEGENERATIVE JOINT DISEASE, FINGERS 03/04/2008  . LOW BACK PAIN, CHRONIC 02/01/2009  . BACK PAIN, RIGHT 12/05/2009  . LEG PAIN, BILATERAL 02/01/2009  . OSTEOPOROSIS 02/12/2007  . DIZZINESS 02/12/2007  . INSOMNIA-SLEEP DISORDER-UNSPEC 08/04/2007  . FATIGUE 02/12/2007  . CHEST PAIN 02/12/2007  . Coronary artery disease 9/09    nonobstructive by cath  . Peripheral vascular disease 9/09    left carotid 60-80%   . Impaired  glucose tolerance 05/11/2010  . Leg pain, bilateral 05/12/2010  . Syncope and collapse   . Essential hypertension, benign 06/17/2012   Past Surgical History  Procedure Laterality Date  . S/p bowel obstruction    . Abdominal hysterectomy    . Appendectomy    . Oophorectomy    . Tonsillectomy    . S/p right cea  2/09  . S/p right thyroid nodule biopsy  March 2011    negative  . Carotid endarterectomy    . Cardiac catheterization     Family History  Problem Relation Age of Onset  . Cancer Son     Possible colon cancer  . Heart attack Other   . Stroke Other   . Alcohol abuse Other   . Diabetes Other   . Hypertension Other    History  Substance Use Topics  . Smoking status: Current Every Day Smoker -- 0.50 packs/day  . Smokeless tobacco: Current User  . Alcohol Use: Yes     Comment: beer occasionally   OB History   Grav Para Term Preterm Abortions TAB SAB Ect Mult Living                 Review of Systems   See History of Present Illness; otherwise all other systems are reviewed and negative  Allergies  Tramadol  Home Medications   Prior to Admission medications   Medication Sig Start Date End Date Taking? Authorizing Provider  acetaminophen-codeine (TYLENOL #3) 300-30 MG per tablet Take 1 tablet by mouth every 6 (six) hours as needed for moderate pain.   Yes Historical Provider, MD  alendronate (FOSAMAX) 70 MG  tablet Take 70 mg by mouth every 7 (seven) days. Take with a full glass of water on an empty stomach.   Yes Historical Provider, MD  aspirin 81 MG tablet Take 81 mg by mouth daily.   Yes Historical Provider, MD  Aspirin-Salicylamide-Caffeine (BC HEADACHE POWDER PO) Take 1 packet by mouth daily as needed (for pain).   Yes Historical Provider, MD  atenolol (TENORMIN) 50 MG tablet Take 50 mg by mouth daily. 12/07/12  Yes Oswald Hillock, MD  benzonatate (TESSALON) 100 MG capsule Take 100-200 mg by mouth 3 (three) times daily as needed for cough.   Yes Historical  Provider, MD  cholecalciferol (VITAMIN D) 1000 UNITS tablet Take 1,000 Units by mouth daily.    Yes Historical Provider, MD  cloNIDine (CATAPRES) 0.2 MG tablet Take 0.2 mg by mouth 2 (two) times daily. 06/17/12  Yes Biagio Borg, MD  feeding supplement, ENSURE COMPLETE, (ENSURE COMPLETE) LIQD Take 237 mLs by mouth every other day. 12/07/12  Yes Oswald Hillock, MD  oxybutynin (DITROPAN-XL) 10 MG 24 hr tablet Take 10 mg by mouth daily. 06/17/12  Yes Biagio Borg, MD  pantoprazole (PROTONIX) 40 MG tablet Take 1 tablet (40 mg total) by mouth daily. Switch for any other PPI at similar dose and frequency 04/23/13  Yes Shanker Kristeen Mans, MD  simvastatin (ZOCOR) 40 MG tablet Take 40 mg by mouth daily.   Yes Historical Provider, MD  zolpidem (AMBIEN) 5 MG tablet Take 5 mg by mouth at bedtime as needed for sleep.   Yes Historical Provider, MD   BP 197/90  Pulse 94  Temp(Src) 98.1 F (36.7 C) (Oral)  Resp 16  Ht 5\' 5"  (1.651 m)  Wt 115 lb (52.164 kg)  BMI 19.14 kg/m2  SpO2 96% Physical Exam  Nursing note and vitals reviewed. Constitutional: She is oriented to person, place, and time. She appears well-developed and well-nourished. She appears distressed.  Ill-appearing uncomfortable appearing female in moderate distress  HENT:  Head: Normocephalic and atraumatic.  Right Ear: External ear normal.  Left Ear: External ear normal.  Nose: Nose normal.  Mouth/Throat: Oropharynx is clear and moist.  Eyes: Conjunctivae and EOM are normal. Pupils are equal, round, and reactive to light.  Neck: Normal range of motion. Neck supple. No JVD present. No tracheal deviation present. No thyromegaly present.  Cardiovascular: Regular rhythm, normal heart sounds and intact distal pulses.  Exam reveals no gallop and no friction rub.   No murmur heard. Tachycardia and hypertension noted  Pulmonary/Chest: Effort normal and breath sounds normal. No stridor. No respiratory distress. She has no wheezes. She has no rales. She  exhibits no tenderness.  Abdominal: Soft. She exhibits no distension and no mass. There is tenderness (diffuse severe pain with palpation with rebound and voluntary guarding). There is rebound and guarding.  Hypoactive bowel sounds  Musculoskeletal: Normal range of motion. She exhibits no edema and no tenderness.  Lymphadenopathy:    She has no cervical adenopathy.  Neurological: She is alert and oriented to person, place, and time. She has normal reflexes. She exhibits normal muscle tone. Coordination normal.  Skin: Skin is warm and dry. No rash noted. No erythema. No pallor.  Psychiatric: She has a normal mood and affect. Her behavior is normal. Judgment and thought content normal.    ED Course  Procedures (including critical care time) Labs Review Labs Reviewed  CBC WITH DIFFERENTIAL - Abnormal; Notable for the following:    WBC 11.2 (*)  RBC 5.80 (*)    Hemoglobin 17.6 (*)    HCT 51.3 (*)    Neutrophils Relative % 90 (*)    Neutro Abs 10.1 (*)    Lymphocytes Relative 6 (*)    Lymphs Abs 0.6 (*)    All other components within normal limits  BASIC METABOLIC PANEL - Abnormal; Notable for the following:    Potassium 3.1 (*)    Glucose, Bld 209 (*)    GFR calc non Af Amer 54 (*)    GFR calc Af Amer 63 (*)    Anion gap 19 (*)    All other components within normal limits  I-STAT TROPOININ, ED  I-STAT CG4 LACTIC ACID, ED    Imaging Review Ct Head Wo Contrast  09/15/2013   CLINICAL DATA:  Headache.  EXAM: CT HEAD WITHOUT CONTRAST  TECHNIQUE: Contiguous axial images were obtained from the base of the skull through the vertex without contrast.  COMPARISON:  02/04/2003 and 06/04/2011  FINDINGS: No evidence for acute hemorrhage, mass lesion, midline shift, hydrocephalus or large infarct. There is diffuse low density in the periventricular and subcortical white matter. The visualized paranasal sinuses are clear. No acute bone abnormality.  IMPRESSION: No acute intracranial abnormality.   White matter disease is suggestive for chronic small vessel ischemic changes.   Electronically Signed   By: Markus Daft M.D.   On: 09/15/2013 07:06   Ct Angio Abdomen W/cm &/or Wo Contrast  09/15/2013   CLINICAL DATA:  Abdominal pain with nausea and vomiting  EXAM: CT ANGIOGRAPHY ABDOMEN AND PELVIS  TECHNIQUE: Multidetector CT imaging of the abdomen and pelvis was performed using the standard protocol during bolus administration of intravenous contrast. Multiplanar reconstructed images including MIPs were obtained and reviewed to evaluate the vascular anatomy.  CONTRAST:  169mL OMNIPAQUE IOHEXOL 350 MG/ML SOLN  COMPARISON:  April 22, 2013  FINDINGS: There is again noted a focal dissection in the distal aorta toward the left arising at the level of the inferior mesenteric artery and terminating slightly superior to the bifurcation. A second area of focal dissection is noted focally in the proximal left common iliac artery with adjacent calcification. These are stable findings compared to the prior study.  There is again noted extensive atherosclerotic change in the abdominal aorta with multiple foci of calcification. There is again noted occlusion of the right internal iliac artery with diffuse calcification in this vessel, a finding noted previously. There is no abdominal aortic aneurysm. There is no common iliac artery aneurysm. There is moderate calcification at the origin of the celiac axis without hemodynamically significant obstruction. The superior mesenteric artery appears patent. The inferior mesenteric artery arises just lateral to the dissection in is diminutive but patent. There are single renal arteries bilaterally. The left renal artery is patent. There is focal calcification at the ostium of the right renal artery causing approximately 80% diameter stenosis.  No aneurysm is seen in the major pelvic arterial vessels. There is calcification in both common femoral arteries without hemodynamically  significant obstruction. Both external iliac artery show moderate atherosclerotic change without hemodynamically significant obstruction.  There is scarring in the lung bases bilaterally. There is a small hiatal hernia.  Liver is enlarged measuring 19.4 cm in length. There is a Riedel's lobe. There is fatty change in the liver but no focal liver lesion appreciable. There is no gallbladder wall thickening. There is no biliary duct dilatation.  Spleen, pancreas, and adrenals appear normal. There is scarring in the posterior aspect  of the left kidney. No renal mass or hydronephrosis is appreciable. There is no renal or ureteral calculus.  In the pelvis, the urinary bladder is midline with wall thickness within normal limits. There is no pelvic mass or fluid. Uterus is absent. Appendix is absent.  There is no bowel obstruction. No free air or portal venous air. No evidence suggesting bowel ischemia. There is no ascites, adenopathy, or abscess in the abdomen or pelvis. There are no blastic or lytic bone lesions. There is spinal stenosis at L3-4, L4-5, and L5-S1, multifactorial. No blastic or lytic bone lesions appreciable.  Review of the MIP images confirms the above findings.  IMPRESSION: Stable chronic dissection along the leftward aspect of the infrarenal abdominal aorta which arises at the level of the inferior mesenteric artery in terminates above the bifurcation. There is a short-segment chronic dissection in the medial aspect of the left common iliac artery. No extension of dissection is seen compared to prior studies. No aneurysms. Multifocal osteoarthritic change. Note that there is occlusion of the right internal iliac artery due to calcification. There is an approximately 80% diameter stenosis at the origin of the right renal artery. There is patchy calcification in multiple major arterial vessels.  No bowel obstruction or abscess. No bowel ischemia appreciable. Uterus and appendix are absent. Liver is  enlarged with fatty change. There is multilevel spinal stenosis at L3-4, L4-5, L5-S1, multifactorial.   Electronically Signed   By: Lowella Grip M.D.   On: 09/15/2013 07:13     EKG Interpretation None      MDM   Final diagnoses:  Generalized abdominal pain  Nausea and vomiting, vomiting of unspecified type  Hypertensive urgency    73 year old female with severe abdominal pain, nausea vomiting and hypertension.  Difficult to tease out if the pressure is related to pain or is an underlying condition.  She does have history of known aortic dissection infrarenal.  Concern for extension of this dissection with possible mesenteric ischemia and she has pain out of proportion on exam.  Plans for labs, pain and blood pressure control, and CT angiogram abdomen pelvis for further evaluation.    6:09 AM  Patient feeling better, bp under better control.  Awaiting CT scan  7:31 AM Pt continues to feel well, bp controlled, no further abd pain, n/v.  Will give oral challenge.  CT scan without changes.   Kalman Drape, MD 09/15/13 [phone]

## 2013-09-15 NOTE — Discharge Instructions (Signed)
Abdominal Pain Many things can cause abdominal pain. Usually, abdominal pain is not caused by a disease and will improve without treatment. It can often be observed and treated at home. Your health care provider will do a physical exam and possibly order blood tests and X-rays to help determine the seriousness of your pain. However, in many cases, more time must pass before a clear cause of the pain can be found. Before that point, your health care provider may not know if you need more testing or further treatment. HOME CARE INSTRUCTIONS  Monitor your abdominal pain for any changes. The following actions may help to alleviate any discomfort you are experiencing:  Only take over-the-counter or prescription medicines as directed by your health care provider.  Do not take laxatives unless directed to do so by your health care provider.  Try a clear liquid diet (broth, tea, or water) as directed by your health care provider. Slowly move to a bland diet as tolerated. SEEK MEDICAL CARE IF:  You have unexplained abdominal pain.  You have abdominal pain associated with nausea or diarrhea.  You have pain when you urinate or have a bowel movement.  You experience abdominal pain that wakes you in the night.  You have abdominal pain that is worsened or improved by eating food.  You have abdominal pain that is worsened with eating fatty foods.  You have a fever. SEEK IMMEDIATE MEDICAL CARE IF:   Your pain does not go away within 2 hours.  You keep throwing up (vomiting).  Your pain is felt only in portions of the abdomen, such as the right side or the left lower portion of the abdomen.  You pass bloody or black tarry stools. MAKE SURE YOU:  Understand these instructions.   Will watch your condition.   Will get help right away if you are not doing well or get worse.  Document Released: 10/25/2004 Document Revised: 01/20/2013 Document Reviewed: 09/24/2012 [facility] Patient Information  2015 [place], [place]. This information is not intended to replace advice given to you by your health care provider. Make sure you discuss any questions you have with your health care provider.  Hypertension Hypertension, commonly called high blood pressure, is when the force of blood pumping through your arteries is too strong. Your arteries are the blood vessels that carry blood from your heart throughout your body. A blood pressure reading consists of a higher number over a lower number, such as 110/72. The higher number (systolic) is the pressure inside your arteries when your heart pumps. The lower number (diastolic) is the pressure inside your arteries when your heart relaxes. Ideally you want your blood pressure below 120/80. Hypertension forces your heart to work harder to pump blood. Your arteries may become narrow or stiff. Having hypertension puts you at risk for heart disease, stroke, and other problems.  RISK FACTORS Some risk factors for high blood pressure are controllable. Others are not.  Risk factors you cannot control include:   Race. You may be at higher risk if you are African American.  Age. Risk increases with age.  Gender. Men are at higher risk than women before age 8 years. After age 32, women are at higher risk than men. Risk factors you can control include:  Not getting enough exercise or physical activity.  Being overweight.  Getting too much fat, sugar, calories, or salt in your diet.  Drinking too much alcohol. SIGNS AND SYMPTOMS Hypertension does not usually cause signs or symptoms. Extremely high  blood pressure (hypertensive crisis) may cause headache, anxiety, shortness of breath, and nosebleed. DIAGNOSIS  To check if you have hypertension, your health care provider will measure your blood pressure while you are seated, with your arm held at the level of your heart. It should be measured at least twice using the same arm. Certain conditions can cause a  difference in blood pressure between your right and left arms. A blood pressure reading that is higher than normal on one occasion does not mean that you need treatment. If one blood pressure reading is high, ask your health care provider about having it checked again. TREATMENT  Treating high blood pressure includes making lifestyle changes and possibly taking medicine. Living a healthy lifestyle can help lower high blood pressure. You may need to change some of your habits. Lifestyle changes may include:  Following the DASH diet. This diet is high in fruits, vegetables, and whole grains. It is low in salt, red meat, and added sugars.  Getting at least 2 hours of brisk physical activity every week.  Losing weight if necessary.  Not smoking.  Limiting alcoholic beverages.  Learning ways to reduce stress. If lifestyle changes are not enough to get your blood pressure under control, your health care provider may prescribe medicine. You may need to take more than one. Work closely with your health care provider to understand the risks and benefits. HOME CARE INSTRUCTIONS  Have your blood pressure rechecked as directed by your health care provider.   Take medicines only as directed by your health care provider. Follow the directions carefully. Blood pressure medicines must be taken as prescribed. The medicine does not work as well when you skip doses. Skipping doses also puts you at risk for problems.   Do not smoke.   Monitor your blood pressure at home as directed by your health care provider. SEEK MEDICAL CARE IF:   You think you are having a reaction to medicines taken.  You have recurrent headaches or feel dizzy.  You have swelling in your ankles.  You have trouble with your vision. SEEK IMMEDIATE MEDICAL CARE IF:  You develop a severe headache or confusion.  You have unusual weakness, numbness, or feel faint.  You have severe chest or abdominal pain.  You vomit  repeatedly.  You have trouble breathing. MAKE SURE YOU:   Understand these instructions.  Will watch your condition.  Will get help right away if you are not doing well or get worse. Document Released: 01/15/2005 Document Revised: 06/01/2013 Document Reviewed: 11/07/2012 [facility] Patient Information 2015 [place], [place]. This information is not intended to replace advice given to you by your health care provider. Make sure you discuss any questions you have with your health care provider.  Nausea and Vomiting Nausea is a sick feeling that often comes before throwing up (vomiting). Vomiting is a reflex where stomach contents come out of your mouth. Vomiting can cause severe loss of body fluids (dehydration). Children and elderly adults can become dehydrated quickly, especially if they also have diarrhea. Nausea and vomiting are symptoms of a condition or disease. It is important to find the cause of your symptoms. CAUSES   Direct irritation of the stomach lining. This irritation can result from increased acid production (gastroesophageal reflux disease), infection, food poisoning, taking certain medicines (such as nonsteroidal anti-inflammatory drugs), alcohol use, or tobacco use.  Signals from the brain.These signals could be caused by a headache, heat exposure, an inner ear disturbance, increased pressure in the brain  from injury, infection, a tumor, or a concussion, pain, emotional stimulus, or metabolic problems.  An obstruction in the gastrointestinal tract (bowel obstruction).  Illnesses such as diabetes, hepatitis, gallbladder problems, appendicitis, kidney problems, cancer, sepsis, atypical symptoms of a heart attack, or eating disorders.  Medical treatments such as chemotherapy and radiation.  Receiving medicine that makes you sleep (general anesthetic) during surgery. DIAGNOSIS Your caregiver may ask for tests to be done if the problems do not improve after a few days. Tests  may also be done if symptoms are severe or if the reason for the nausea and vomiting is not clear. Tests may include:  Urine tests.  Blood tests.  Stool tests.  Cultures (to look for evidence of infection).  X-rays or other imaging studies. Test results can help your caregiver make decisions about treatment or the need for additional tests. TREATMENT You need to stay well hydrated. Drink frequently but in small amounts.You may wish to drink water, sports drinks, clear broth, or eat frozen ice pops or gelatin dessert to help stay hydrated.When you eat, eating slowly may help prevent nausea.There are also some antinausea medicines that may help prevent nausea. HOME CARE INSTRUCTIONS   Take all medicine as directed by your caregiver.  If you do not have an appetite, do not force yourself to eat. However, you must continue to drink fluids.  If you have an appetite, eat a normal diet unless your caregiver tells you differently.  Eat a variety of complex carbohydrates (rice, wheat, potatoes, bread), lean meats, yogurt, fruits, and vegetables.  Avoid high-fat foods because they are more difficult to digest.  Drink enough water and fluids to keep your urine clear or pale yellow.  If you are dehydrated, ask your caregiver for specific rehydration instructions. Signs of dehydration may include:  Severe thirst.  Dry lips and mouth.  Dizziness.  Dark urine.  Decreasing urine frequency and amount.  Confusion.  Rapid breathing or pulse. SEEK IMMEDIATE MEDICAL CARE IF:   You have blood or brown flecks (like coffee grounds) in your vomit.  You have black or bloody stools.  You have a severe headache or stiff neck.  You are confused.  You have severe abdominal pain.  You have chest pain or trouble breathing.  You do not urinate at least once every 8 hours.  You develop cold or clammy skin.  You continue to vomit for longer than 24 to 48 hours.  You have a fever. MAKE  SURE YOU:   Understand these instructions.  Will watch your condition.  Will get help right away if you are not doing well or get worse. Document Released: 01/15/2005 Document Revised: 04/09/2011 Document Reviewed: 06/14/2010 [facility] Patient Information 2015 [place], [place]. This information is not intended to replace advice given to you by your health care provider. Make sure you discuss any questions you have with your health care provider.

## 2013-09-18 ENCOUNTER — Encounter: Payer: Self-pay | Admitting: Internal Medicine

## 2013-09-18 ENCOUNTER — Ambulatory Visit (INDEPENDENT_AMBULATORY_CARE_PROVIDER_SITE_OTHER): Payer: Medicare Other | Admitting: Internal Medicine

## 2013-09-18 ENCOUNTER — Telehealth: Payer: Self-pay | Admitting: Internal Medicine

## 2013-09-18 VITALS — BP 142/72 | HR 54 | Temp 98.2°F | Wt 113.0 lb

## 2013-09-18 DIAGNOSIS — I10 Essential (primary) hypertension: Secondary | ICD-10-CM

## 2013-09-18 DIAGNOSIS — G894 Chronic pain syndrome: Secondary | ICD-10-CM

## 2013-09-18 DIAGNOSIS — I2510 Atherosclerotic heart disease of native coronary artery without angina pectoris: Secondary | ICD-10-CM

## 2013-09-18 DIAGNOSIS — I701 Atherosclerosis of renal artery: Secondary | ICD-10-CM

## 2013-09-18 DIAGNOSIS — D751 Secondary polycythemia: Secondary | ICD-10-CM

## 2013-09-18 DIAGNOSIS — G4700 Insomnia, unspecified: Secondary | ICD-10-CM

## 2013-09-18 DIAGNOSIS — R7302 Impaired glucose tolerance (oral): Secondary | ICD-10-CM

## 2013-09-18 DIAGNOSIS — I7100 Dissection of unspecified site of aorta: Secondary | ICD-10-CM

## 2013-09-18 DIAGNOSIS — R7309 Other abnormal glucose: Secondary | ICD-10-CM

## 2013-09-18 DIAGNOSIS — Z23 Encounter for immunization: Secondary | ICD-10-CM

## 2013-09-18 DIAGNOSIS — E785 Hyperlipidemia, unspecified: Secondary | ICD-10-CM

## 2013-09-18 MED ORDER — ACETAMINOPHEN-CODEINE #3 300-30 MG PO TABS: 1.0000 | ORAL_TABLET | Freq: Four times a day (QID) | ORAL | Status: DC | PRN

## 2013-09-18 MED ORDER — ATORVASTATIN CALCIUM 20 MG PO TABS: 20.0000 mg | ORAL_TABLET | Freq: Every day | ORAL | Status: DC

## 2013-09-18 MED ORDER — AMLODIPINE BESYLATE 5 MG PO TABS: 5.0000 mg | ORAL_TABLET | Freq: Every day | ORAL | Status: DC

## 2013-09-18 MED ORDER — ZOLPIDEM TARTRATE 5 MG PO TABS: 5.0000 mg | ORAL_TABLET | Freq: Every evening | ORAL | Status: DC | PRN

## 2013-09-18 NOTE — Telephone Encounter (Signed)
Advise if interaction

## 2013-09-18 NOTE — Telephone Encounter (Signed)
Barnabas Lister with [facility] is calling in regards to the patient's Amlodipine and Simvastatin medications. States that there is a drug interaction. Barnabas Lister can be reached back at [phone]. Please advise.

## 2013-09-18 NOTE — Progress Notes (Signed)
Subjective:    Patient ID: [name], female    DOB: [birth year], 73 y.o.   MRN: [id number]  HPI Here to f/u; overall doing ok,  Pt denies chest pain, increased sob or doe, wheezing, orthopnea, PND, increased LE swelling, palpitations, dizziness or syncope.  Pt denies polydipsia, polyuria, or low sugar symptoms such as weakness or confusion improved with po intake.  Pt denies new neurological symptoms such as new headache, or facial or extremity weakness or numbness.   Pt states overall good compliance with meds, has been trying to follow lower cholesterol diet, with wt overall stable.  C/o ongoing pain, not well controlled.  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 Much difficulty getting to sleep, staying asleep most nights.  Denies worsening depressive symptoms, suicidal ideation, or panic; has ongoing anxiety, some increased recently. Still smoking, recent labs with elevated Hgb.  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 THYROID NODULE, RIGHT 02/01/2009  . GLUCOSE INTOLERANCE 11/04/2007  . HYPERLIPIDEMIA 02/12/2007  . HYPOKALEMIA 11/04/2007  . ANXIETY 02/12/2007  . DEPRESSION 02/12/2007  . HYPERTENSION 11/07/2006  . CORONARY ARTERY DISEASE 11/04/2007  . CAROTID ARTERY STENOSIS, BILATERAL 02/24/2007  . Unspecified Peripheral Vascular Disease 02/12/2007  . ASTHMATIC BRONCHITIS, ACUTE 04/22/2008  . ALLERGIC RHINITIS 02/12/2007  . COPD 11/04/2007  . GERD 02/12/2007  . CONSTIPATION 02/12/2007  . DEGENERATIVE JOINT DISEASE, FINGERS 03/04/2008  . LOW BACK PAIN, CHRONIC 02/01/2009  . BACK PAIN, RIGHT 12/05/2009  . LEG PAIN, BILATERAL 02/01/2009  . OSTEOPOROSIS 02/12/2007  . DIZZINESS 02/12/2007  . INSOMNIA-SLEEP DISORDER-UNSPEC 08/04/2007  . FATIGUE 02/12/2007  . CHEST PAIN 02/12/2007  . Coronary artery disease 9/09    nonobstructive by cath  . Peripheral  vascular disease 9/09    left carotid 60-80%   . Impaired glucose tolerance 05/11/2010  . Leg pain, bilateral 05/12/2010  . Syncope and collapse   . Essential hypertension, benign 06/17/2012   Past Surgical History  Procedure Laterality Date  . S/p bowel obstruction    . Abdominal hysterectomy    . Appendectomy    . Oophorectomy    . Tonsillectomy    . S/p right cea  2/09  . S/p right thyroid nodule biopsy  March 2011    negative  . Carotid endarterectomy    . Cardiac catheterization      reports that she has been smoking.  She uses smokeless tobacco. She reports that she drinks alcohol. She reports that she does not use illicit drugs. family history includes Alcohol abuse in her other; Cancer in her son; Diabetes in her other; Heart attack in her other; Hypertension in her other; Stroke in her other. Allergies  Allergen Reactions  . Tramadol     constipation   Current Outpatient Prescriptions on File Prior to Visit  Medication Sig Dispense Refill  . alendronate (FOSAMAX) 70 MG tablet Take 70 mg by mouth every 7 (seven) days. Take with a full glass of water on an empty stomach.      Marland Kitchen aspirin 81 MG tablet Take 81 mg by mouth daily.      . Aspirin-Salicylamide-Caffeine (BC HEADACHE POWDER PO) Take 1 packet by mouth daily as needed (for pain).      Marland Kitchen atenolol (TENORMIN) 50 MG tablet Take 50 mg by mouth daily.      Marland Kitchen  benzonatate (TESSALON) 100 MG capsule Take 100-200 mg by mouth 3 (three) times daily as needed for cough.      . cholecalciferol (VITAMIN D) 1000 UNITS tablet Take 1,000 Units by mouth daily.       . cloNIDine (CATAPRES) 0.2 MG tablet Take 0.2 mg by mouth 2 (two) times daily.      Marland Kitchen dicyclomine (BENTYL) 20 MG tablet Take 1 tablet (20 mg total) by mouth every 6 (six) hours as needed for spasms.  20 tablet  0  . feeding supplement, ENSURE COMPLETE, (ENSURE COMPLETE) LIQD Take 237 mLs by mouth every other day.      . ondansetron (ZOFRAN ODT) 8 MG disintegrating tablet Take 1  tablet (8 mg total) by mouth every 8 (eight) hours as needed for nausea or vomiting.  20 tablet  0  . oxybutynin (DITROPAN-XL) 10 MG 24 hr tablet Take 10 mg by mouth daily.      . pantoprazole (PROTONIX) 40 MG tablet Take 1 tablet (40 mg total) by mouth daily. Switch for any other PPI at similar dose and frequency  30 tablet  0  . simvastatin (ZOCOR) 40 MG tablet Take 40 mg by mouth daily.       No current facility-administered medications on file prior to visit.    Review of Systems  Constitutional: Negative for unusual diaphoresis or other sweats  HENT: Negative for ringing in ear Eyes: Negative for double vision or worsening visual disturbance.  Respiratory: Negative for choking and stridor.   Gastrointestinal: Negative for vomiting or other signifcant bowel change Genitourinary: Negative for hematuria or decreased urine volume.  Musculoskeletal: Negative for other MSK pain or swelling Skin: Negative for color change and worsening wound.  Neurological: Negative for tremors and numbness other than noted  Psychiatric/Behavioral: Negative for decreased concentration or agitation other than above       Objective:   Physical Exam BP 142/72  Pulse 54  Temp(Src) 98.2 F (36.8 C) (Oral)  Wt 113 lb (51.256 kg)  SpO2 98% VS noted,  Constitutional: Pt appears well-developed, well-nourished.  HENT: Head: NCAT.  Right Ear: External ear normal.  Left Ear: External ear normal.  Eyes: . Pupils are equal, round, and reactive to light. Conjunctivae and EOM are normal Neck: Normal range of motion. Neck supple.  Cardiovascular: Normal rate and regular rhythm.   Pulmonary/Chest: Effort normal and breath sounds normal.  Abd:  Soft, NT, ND, + BS Neurological: Pt is alert. Not confused , motor grossly intact Skin: Skin is warm. No rash Psychiatric: Pt behavior is normal. No agitation.     Assessment & Plan:

## 2013-09-18 NOTE — Progress Notes (Signed)
Pre visit review using our clinic review tool, if applicable. No additional management support is needed unless otherwise documented below in the visit note. 

## 2013-09-18 NOTE — Telephone Encounter (Signed)
Pharmacy informed of change.  Tried to call the patient but number in chart has been disconnected.

## 2013-09-18 NOTE — Telephone Encounter (Signed)
Since pt needs the amlodipine for BP and should not be taken with the 40 mg zocor, we will need to try to change the zocor /simvastatin to:  lipitor 20 mg per day - [facility]

## 2013-09-18 NOTE — Patient Instructions (Addendum)
You had the flu shot today, and the new Prevnar Pnuemonia shot  Please take all new medication as prescribed - the amlodipine 5 mg per day for blood pressure  Please continue all other medications as before, and refills have been done if requested - the tylenol #3  Please have the pharmacy call with any other refills you may need.  Please continue your efforts at being more active, low cholesterol diet, and weight control.  Please stop smoking  Please keep your appointments with your specialists as you may have planned  You will be contacted regarding the referral for: Pain clinic, vascular surgury, and Hematology  You will be contacted regarding the referral for: mammogram   Please go to the LAB in the Basement (turn left off the elevator) for the tests to be done today  You will be contacted by phone if any changes need to be made immediately.  Otherwise, you will receive a letter about your results with an explanation, but please check with MyChart first.  Please remember to sign up for MyChart if you have not done so, as this will be important to you in the future with finding out test results, communicating by private email, and scheduling acute appointments online when needed.  Please return in 6 months, or sooner if needed

## 2013-09-20 NOTE — Assessment & Plan Note (Signed)
stable overall by history and exam, recent data reviewed with pt, and pt to continue medical treatment as before,  to f/u any worsening symptoms or concerns Lab Results  Component Value Date   LDLCALC 81 07/09/2012   For f/u lab

## 2013-09-20 NOTE — Assessment & Plan Note (Signed)
For vasc surgury referral,  to f/u any worsening symptoms or concerns

## 2013-09-20 NOTE — Assessment & Plan Note (Signed)
stable overall by history and exam, recent data reviewed with pt, and pt to continue medical treatment as before,  to f/u any worsening symptoms or concerns Lab Results  Component Value Date   HGBA1C 5.5 07/09/2012   For f/u a1c

## 2013-09-20 NOTE — Assessment & Plan Note (Signed)
For ambien prn,  to f/u any worsening symptoms or concerns

## 2013-09-20 NOTE — Assessment & Plan Note (Signed)
For heme referral, stop smoking

## 2013-09-20 NOTE — Assessment & Plan Note (Signed)
Also for vasc surgury referral

## 2013-09-20 NOTE — Assessment & Plan Note (Addendum)
Uncontrolled, for add amldopine 5 qd  Note:  Total time for pt hx, exam, review of record with pt in the room, determination of diagnoses and plan for further eval and tx is > 40 min, with over 50% spent in coordination and counseling of patient

## 2013-09-20 NOTE — Assessment & Plan Note (Signed)
For pain clinic referral

## 2013-09-22 ENCOUNTER — Other Ambulatory Visit: Payer: Self-pay

## 2013-09-22 ENCOUNTER — Encounter: Payer: Self-pay | Admitting: Internal Medicine

## 2013-09-22 NOTE — Telephone Encounter (Signed)
Updated medication list

## 2013-09-24 ENCOUNTER — Other Ambulatory Visit: Payer: Self-pay | Admitting: *Deleted

## 2013-09-24 DIAGNOSIS — I701 Atherosclerosis of renal artery: Secondary | ICD-10-CM

## 2013-09-24 DIAGNOSIS — I7100 Dissection of unspecified site of aorta: Secondary | ICD-10-CM

## 2013-10-07 ENCOUNTER — Emergency Department (HOSPITAL_COMMUNITY): Payer: Medicare Other

## 2013-10-07 ENCOUNTER — Emergency Department (HOSPITAL_COMMUNITY)
Admission: EM | Admit: 2013-10-07 | Discharge: 2013-10-07 | Disposition: A | Discharge status: Home / Self Care | Payer: Medicare Other | Attending: Emergency Medicine | Admitting: Emergency Medicine

## 2013-10-07 ENCOUNTER — Encounter (HOSPITAL_COMMUNITY): Payer: Self-pay | Admitting: Emergency Medicine

## 2013-10-07 DIAGNOSIS — J4489 Other specified chronic obstructive pulmonary disease: Secondary | ICD-10-CM | POA: Insufficient documentation

## 2013-10-07 DIAGNOSIS — I10 Essential (primary) hypertension: Secondary | ICD-10-CM

## 2013-10-07 DIAGNOSIS — R112 Nausea with vomiting, unspecified: Secondary | ICD-10-CM | POA: Diagnosis present

## 2013-10-07 DIAGNOSIS — Z8739 Personal history of other diseases of the musculoskeletal system and connective tissue: Secondary | ICD-10-CM | POA: Diagnosis not present

## 2013-10-07 DIAGNOSIS — R1030 Lower abdominal pain, unspecified: Secondary | ICD-10-CM

## 2013-10-07 DIAGNOSIS — J449 Chronic obstructive pulmonary disease, unspecified: Secondary | ICD-10-CM | POA: Insufficient documentation

## 2013-10-07 DIAGNOSIS — F17200 Nicotine dependence, unspecified, uncomplicated: Secondary | ICD-10-CM | POA: Insufficient documentation

## 2013-10-07 DIAGNOSIS — Z8659 Personal history of other mental and behavioral disorders: Secondary | ICD-10-CM | POA: Diagnosis not present

## 2013-10-07 DIAGNOSIS — K5289 Other specified noninfective gastroenteritis and colitis: Secondary | ICD-10-CM | POA: Insufficient documentation

## 2013-10-07 DIAGNOSIS — Z7982 Long term (current) use of aspirin: Secondary | ICD-10-CM | POA: Diagnosis not present

## 2013-10-07 DIAGNOSIS — K529 Noninfective gastroenteritis and colitis, unspecified: Secondary | ICD-10-CM

## 2013-10-07 DIAGNOSIS — Z79899 Other long term (current) drug therapy: Secondary | ICD-10-CM | POA: Diagnosis not present

## 2013-10-07 DIAGNOSIS — I2510 Atherosclerotic heart disease of native coronary artery without angina pectoris: Secondary | ICD-10-CM | POA: Diagnosis not present

## 2013-10-07 DIAGNOSIS — E785 Hyperlipidemia, unspecified: Secondary | ICD-10-CM | POA: Insufficient documentation

## 2013-10-07 DIAGNOSIS — I701 Atherosclerosis of renal artery: Secondary | ICD-10-CM | POA: Diagnosis not present

## 2013-10-07 LAB — CBC WITH DIFFERENTIAL/PLATELET
BASOS ABS: 0 10*3/uL (ref 0.0–0.1)
BASOS PCT: 0 % (ref 0–1)
EOS ABS: 0 10*3/uL (ref 0.0–0.7)
EOS PCT: 0 % (ref 0–5)
HCT: 46.9 % — ABNORMAL HIGH (ref 36.0–46.0)
HEMOGLOBIN: 16.2 g/dL — AB (ref 12.0–15.0)
Lymphocytes Relative: 6 % — ABNORMAL LOW (ref 12–46)
Lymphs Abs: 0.7 10*3/uL (ref 0.7–4.0)
MCH: 30.1 pg (ref 26.0–34.0)
MCHC: 34.5 g/dL (ref 30.0–36.0)
MCV: 87 fL (ref 78.0–100.0)
MONO ABS: 0.7 10*3/uL (ref 0.1–1.0)
MONOS PCT: 6 % (ref 3–12)
NEUTROS ABS: 10.2 10*3/uL — AB (ref 1.7–7.7)
Neutrophils Relative %: 88 % — ABNORMAL HIGH (ref 43–77)
Platelets: 217 10*3/uL (ref 150–400)
RBC: 5.39 MIL/uL — ABNORMAL HIGH (ref 3.87–5.11)
RDW: 14.6 % (ref 11.5–15.5)
WBC: 11.6 10*3/uL — ABNORMAL HIGH (ref 4.0–10.5)

## 2013-10-07 LAB — I-STAT CG4 LACTIC ACID, ED: Lactic Acid, Venous: 1 mmol/L (ref 0.5–2.2)

## 2013-10-07 LAB — COMPREHENSIVE METABOLIC PANEL
ALBUMIN: 4.2 g/dL (ref 3.5–5.2)
ALT: 8 U/L (ref 0–35)
AST: 17 U/L (ref 0–37)
Alkaline Phosphatase: 63 U/L (ref 39–117)
Anion gap: 18 — ABNORMAL HIGH (ref 5–15)
BUN: 17 mg/dL (ref 6–23)
CO2: 24 mEq/L (ref 19–32)
Calcium: 10.1 mg/dL (ref 8.4–10.5)
Chloride: 97 mEq/L (ref 96–112)
Creatinine, Ser: 0.85 mg/dL (ref 0.50–1.10)
GFR calc Af Amer: 77 mL/min — ABNORMAL LOW (ref >90)
GFR calc non Af Amer: 67 mL/min — ABNORMAL LOW (ref >90)
Glucose, Bld: 127 mg/dL — ABNORMAL HIGH (ref 70–99)
POTASSIUM: 3 meq/L — AB (ref 3.7–5.3)
SODIUM: 139 meq/L (ref 137–147)
TOTAL PROTEIN: 8.5 g/dL — AB (ref 6.0–8.3)
Total Bilirubin: 0.4 mg/dL (ref 0.3–1.2)

## 2013-10-07 LAB — URINE MICROSCOPIC-ADD ON

## 2013-10-07 LAB — URINALYSIS, ROUTINE W REFLEX MICROSCOPIC
GLUCOSE, UA: 100 mg/dL — AB
Ketones, ur: 15 mg/dL — AB
LEUKOCYTES UA: NEGATIVE
NITRITE: NEGATIVE
PROTEIN: 100 mg/dL — AB
Specific Gravity, Urine: 1.019 (ref 1.005–1.030)
UROBILINOGEN UA: 1 mg/dL (ref 0.0–1.0)
pH: 6.5 (ref 5.0–8.0)

## 2013-10-07 LAB — TROPONIN I: Troponin I: <0.3 ng/mL (ref <0.30)

## 2013-10-07 MED ORDER — LABETALOL HCL 5 MG/ML IV SOLN
10.0000 mg | Freq: Once | INTRAVENOUS | Status: AC
  Administered 2013-10-07: 10 mg via INTRAVENOUS
  Filled 2013-10-07: qty 4

## 2013-10-07 MED ORDER — ONDANSETRON HCL 4 MG/2ML IJ SOLN
4.0000 mg | Freq: Once | INTRAMUSCULAR | Status: AC
  Administered 2013-10-07: 4 mg via INTRAVENOUS
  Filled 2013-10-07: qty 2

## 2013-10-07 MED ORDER — POTASSIUM CHLORIDE CRYS ER 20 MEQ PO TBCR
40.0000 meq | EXTENDED_RELEASE_TABLET | Freq: Once | ORAL | Status: AC
  Administered 2013-10-07: 40 meq via ORAL
  Filled 2013-10-07: qty 2

## 2013-10-07 MED ORDER — HYDROCODONE-ACETAMINOPHEN 5-325 MG PO TABS: 1.0000 | ORAL_TABLET | ORAL | Status: DC | PRN

## 2013-10-07 MED ORDER — ONDANSETRON 4 MG PO TBDP: ORAL_TABLET | ORAL | Status: DC

## 2013-10-07 MED ORDER — IOHEXOL 350 MG/ML SOLN
100.0000 mL | Freq: Once | INTRAVENOUS | Status: AC | PRN
  Administered 2013-10-07: 100 mL via INTRAVENOUS

## 2013-10-07 MED ORDER — SODIUM CHLORIDE 0.9 % IV BOLUS (SEPSIS)
500.0000 mL | Freq: Once | INTRAVENOUS | Status: AC
  Administered 2013-10-07: 500 mL via INTRAVENOUS

## 2013-10-07 MED ORDER — MORPHINE SULFATE 4 MG/ML IJ SOLN
4.0000 mg | Freq: Once | INTRAMUSCULAR | Status: AC
  Administered 2013-10-07: 4 mg via INTRAVENOUS
  Filled 2013-10-07: qty 1

## 2013-10-07 NOTE — ED Notes (Signed)
Bed: PF79 Expected date:  Expected time:  Means of arrival:  Comments: 73 yo, nausea, hypertensive

## 2013-10-07 NOTE — ED Notes (Signed)
Per EMS: pt. Complaint of 24 hr N/V. Ongoing HTN 227/104. NSR. 4 of Zofran given in route.

## 2013-10-07 NOTE — ED Provider Notes (Addendum)
CSN: [id number]     Arrival date & time 10/07/13  1005 History   First MD Initiated Contact with Patient 10/07/13 1008     Chief Complaint  Patient presents with  . Nausea  . Emesis     (Consider location/radiation/quality/duration/timing/severity/associated sxs/prior Treatment) HPI Comments: 73 year old female with history of abdominal dissection chronic, CAD, low back pain chronic, high blood pressure, arthritis presents with abdominal pain nausea and vomiting since yesterday. Patient had gradually worsening lower bowel pain radiating to the back mostly central. Says different than her previous pain. No recent surgeries. Patient had hysterectomy in the appendix removed in the past. Pain worse with palpation. Decreased appetite.  Patient is a 73 y.o. female presenting with vomiting. The history is provided by the patient.  Emesis Associated symptoms: abdominal pain   Associated symptoms: no chills and no headaches     Past Medical History  Diagnosis Date  . THYROID NODULE, RIGHT 02/01/2009  . GLUCOSE INTOLERANCE 11/04/2007  . HYPERLIPIDEMIA 02/12/2007  . HYPOKALEMIA 11/04/2007  . ANXIETY 02/12/2007  . DEPRESSION 02/12/2007  . HYPERTENSION 11/07/2006  . CORONARY ARTERY DISEASE 11/04/2007  . CAROTID ARTERY STENOSIS, BILATERAL 02/24/2007  . Unspecified Peripheral Vascular Disease 02/12/2007  . ASTHMATIC BRONCHITIS, ACUTE 04/22/2008  . ALLERGIC RHINITIS 02/12/2007  . COPD 11/04/2007  . GERD 02/12/2007  . CONSTIPATION 02/12/2007  . DEGENERATIVE JOINT DISEASE, FINGERS 03/04/2008  . LOW BACK PAIN, CHRONIC 02/01/2009  . BACK PAIN, RIGHT 12/05/2009  . LEG PAIN, BILATERAL 02/01/2009  . OSTEOPOROSIS 02/12/2007  . DIZZINESS 02/12/2007  . INSOMNIA-SLEEP DISORDER-UNSPEC 08/04/2007  . FATIGUE 02/12/2007  . CHEST PAIN 02/12/2007  . Coronary artery disease 9/09    nonobstructive by cath  . Peripheral vascular disease 9/09    left carotid 60-80%   . Impaired glucose tolerance 05/11/2010  . Leg pain, bilateral  05/12/2010  . Syncope and collapse   . Essential hypertension, benign 06/17/2012   Past Surgical History  Procedure Laterality Date  . S/p bowel obstruction    . Abdominal hysterectomy    . Appendectomy    . Oophorectomy    . Tonsillectomy    . S/p right cea  2/09  . S/p right thyroid nodule biopsy  March 2011    negative  . Carotid endarterectomy    . Cardiac catheterization     Family History  Problem Relation Age of Onset  . Cancer Son     Possible colon cancer  . Heart attack Other   . Stroke Other   . Alcohol abuse Other   . Diabetes Other   . Hypertension Other    History  Substance Use Topics  . Smoking status: Current Every Day Smoker -- 0.50 packs/day  . Smokeless tobacco: Current User  . Alcohol Use: Yes     Comment: beer occasionally   OB History   Grav Para Term Preterm Abortions TAB SAB Ect Mult Living                 Review of Systems  Constitutional: Positive for appetite change. Negative for fever and chills.  HENT: Negative for congestion.   Eyes: Negative for visual disturbance.  Respiratory: Negative for shortness of breath.   Cardiovascular: Negative for chest pain.  Gastrointestinal: Positive for nausea, vomiting and abdominal pain.  Genitourinary: Negative for dysuria and flank pain.  Musculoskeletal: Negative for back pain, neck pain and neck stiffness.  Skin: Negative for rash.  Neurological: Negative for light-headedness and headaches.      Allergies  Tramadol  Home Medications   Prior to Admission medications   Medication Sig Start Date End Date Taking? Authorizing Provider  amLODipine (NORVASC) 5 MG tablet Take 1 tablet (5 mg total) by mouth daily. 09/18/13  Yes Biagio Borg, MD  aspirin 81 MG tablet Take 81 mg by mouth daily.   Yes Historical Provider, MD  Aspirin-Salicylamide-Caffeine (BC HEADACHE POWDER PO) Take 1 packet by mouth daily as needed (for pain).   Yes Historical Provider, MD  atenolol (TENORMIN) 50 MG tablet Take  50 mg by mouth daily. 12/07/12  Yes Oswald Hillock, MD  atorvastatin (LIPITOR) 20 MG tablet Take 1 tablet (20 mg total) by mouth daily. 09/18/13 09/18/14 Yes Biagio Borg, MD  feeding supplement, ENSURE COMPLETE, (ENSURE COMPLETE) LIQD Take 237 mLs by mouth every other day. 12/07/12  Yes Oswald Hillock, MD   BP 230/114  Pulse 96  Temp(Src) 98.3 F (36.8 C) (Oral)  Resp 20  SpO2 94% Physical Exam  Nursing note and vitals reviewed. Constitutional: She is oriented to person, place, and time. She appears well-developed and well-nourished.  HENT:  Head: Normocephalic and atraumatic.  Mild dry meters membranes  Eyes: Conjunctivae are normal. Right eye exhibits no discharge. Left eye exhibits no discharge.  Neck: Normal range of motion. Neck supple. No tracheal deviation present.  Cardiovascular: Normal rate and regular rhythm.   Pulmonary/Chest: Effort normal and breath sounds normal.  Abdominal: Soft. She exhibits no distension. There is tenderness (tender suprapubic and lower abdomen bilateral moderate). There is no guarding.  Musculoskeletal: She exhibits no edema.  Neurological: She is alert and oriented to person, place, and time. No cranial nerve deficit.  Skin: Skin is warm. No rash noted.  Psychiatric: She has a normal mood and affect.    ED Course  Procedures (including critical care time) EMERGENCY DEPARTMENT ULTRASOUND  Study: Limited Retroperitoneal Ultrasound of the Abdominal Aorta.  INDICATIONS:Abdominal pain, Back pain and Age>55 Multiple views of the abdominal aorta were obtained in real-time from the diaphragmatic hiatus to the aortic bifurcation in transverse planes with a multi-frequency probe. PERFORMED BY: Myself IMAGES ARCHIVED?: Yes FINDINGS: Maximum aortic dimensions are 2.25 cm LIMITATIONS:  Bowel gas and Abdominal pain INTERPRETATION:  No abdominal aortic aneurysm    Labs Review Labs Reviewed  COMPREHENSIVE METABOLIC PANEL - Abnormal; Notable for the following:     Potassium 3.0 (*)    Glucose, Bld 127 (*)    Total Protein 8.5 (*)    GFR calc non Af Amer 67 (*)    GFR calc Af Amer 77 (*)    Anion gap 18 (*)    All other components within normal limits  CBC WITH DIFFERENTIAL - Abnormal; Notable for the following:    WBC 11.6 (*)    RBC 5.39 (*)    Hemoglobin 16.2 (*)    HCT 46.9 (*)    Neutrophils Relative % 88 (*)    Neutro Abs 10.2 (*)    Lymphocytes Relative 6 (*)    All other components within normal limits  URINALYSIS, ROUTINE W REFLEX MICROSCOPIC - Abnormal; Notable for the following:    Glucose, UA 100 (*)    Hgb urine dipstick TRACE (*)    Bilirubin Urine SMALL (*)    Ketones, ur 15 (*)    Protein, ur 100 (*)    All other components within normal limits  URINE MICROSCOPIC-ADD ON - Abnormal; Notable for the following:    Squamous Epithelial / LPF FEW (*)  Casts HYALINE CASTS (*)    All other components within normal limits  TROPONIN I  I-STAT CG4 LACTIC ACID, ED    Imaging Review Ct Cta Abd/pel W/cm &/or W/o Cm  10/07/2013   CLINICAL DATA:  Abdominal pain radiating to back. History of chronic dissection. Evaluate for interval change.  EXAM: CT ANGIOGRAPHY ABDOMEN AND PELVIS WITH CONTRAST AND WITHOUT CONTRAST  TECHNIQUE: Multidetector CT imaging of the abdomen and pelvis was performed using the standard protocol during bolus administration of intravenous contrast. Multiplanar reconstructed images including MIPs were obtained and reviewed to evaluate the vascular anatomy.  CONTRAST:  119mL OMNIPAQUE IOHEXOL 350 MG/ML SOLN  COMPARISON:  CT abdomen pelvis - 09/15/2013; 04/22/2013  FINDINGS: Vascular Findings:  Abdominal aorta: There is a large amount of eccentric mixed calcified and largely calcified eccentric atherosclerotic plaque throughout the normal caliber abdominal aorta. Eccentric irregular atherosclerotic plaque projects into the intraluminal aspect of the mid abdominal aorta (axial image 44, series 5, not resulting in a  hemodynamically significant stenosis. No abdominal aortic dissection or perivascular stranding. There is a small (approximately 1.1 x 0.6 cm penetrating atherosclerotic ulcer arising from the anterior left lateral aspect of the infrarenal abdominal aorta (representative coronal image 40, series 8) without associated perivascular stranding.  Celiac artery: There is a minimal amount of eccentric mixed calcified and noncalcified atherosclerotic plaque involving the origin of the celiac artery, not resulting in a hemodynamically significant stenosis. The right inferior phrenic artery is incidentally noted to arise immediately cranial to the origin of the celiac artery. Conventional branching pattern. The splenic artery is disease though patent and of normal caliber.  SMA: Widely patent without hemodynamically significant narrowing. There is a minimal amount of eccentric calcified plaque within the distal aspect of the main trunk of the SMA (representative axial image 55, series 5, not resulting in hemodynamically significant stenosis. The distal tributaries of the SMA are widely patent without evidence of distal embolism.  Right Renal artery: Solitary; eccentric calcified atherosclerotic plaque involving the origin the right renal artery, not resulting in hemodynamically significant stenosis.  Left Renal artery: Duplicated; there is a tiny accessory left renal artery which supplies the inferior pole the left kidney. There is eccentric mixed calcified and noncalcified atherosclerotic plaque involving the origin of the dominant left renal artery, not resulting in hemodynamically significant stenosis.  IMA: Disease at its origin no remains patent.  Right-sided pelvic vasculature: There is eccentric mixed calcified and noncalcified atherosclerotic plaque throughout the right common and external iliac arteries, not resulting in hemodynamically significant stenosis. The right internal iliac artery is heavily disease though  patent of normal caliber.  Left-sided pelvic vasculature: There is eccentric mixed calcified atherosclerotic plaque involving the origin and proximal aspect of the left common iliac artery, likely approaching at least 50% luminal narrowing (axial image 63, series 5). The left external iliac artery is mildly diseased proximally though patent and of normal caliber. The left internal iliac artery is diseased though patent and of normal caliber.  Review of the MIP images confirms the above findings.   --------------------------------------------------------------------------------  Nonvascular Findings:  Evaluation of the abdominal organs is limited to the arterial phase of enhancement.  There is abnormal wall thickening and hyperemia involving the horizontal segment of the duodenum (axial image 64, series 5) as well as several loops of proximal jejunum (representative axial images 51 and 54, coronal image is 37, 42 and 56, series 8). There is moderate upstream gaseous distention of the stomach. No definite pneumatosis. No pneumoperitoneum  or portal venous gas. The bowel is otherwise normal in course and caliber without wall thickening. Scattered colonic diverticulosis without evidence of diverticulitis. There is a minimal amount of free fluid within the abdomen. No peripherally enhancing definable/drainable fluid collection.  Normal hepatic contour. No discrete hyper vascular hepatic lesions. Normal appearance of the gallbladder given degree distention.  There is symmetric enhancement of the bilateral kidneys. No definite renal stones on this postcontrast examination. No discrete renal lesions. No urinary destruction or perinephric stranding. Normal appearance of the bilateral adrenal glands, pancreas and spleen.  No bulky retroperitoneal, mesenteric, pelvic or inguinal lymphadenopathy.  Post hysterectomy. No discrete adnexal lesion. There is mild diffuse thickening of the urinary bladder wall, likely accentuated due  to underdistention.  Limited visualization of lower thorax demonstrates minimal subsegmental and grossly symmetric ground-glass atelectasis. No discrete focal airspace opacities. No pleural effusion.  Normal heart size.  No pericardial effusion.  No acute or aggressive osseous abnormalities. Mild multilevel lumbar spine DDD, worse at L4-L5 and L5-S1 with disc space height loss, endplate irregularity and sclerosis.  Regional soft tissues appear normal.  IMPRESSION: Vascular Impression:  1. Unchanged moderate to large amount of eccentric mixed calcified and noncalcified irregular atherosclerotic plaque within a normal caliber abdominal aorta, not resulting in hemodynamically significant stenosis. 2. Unchanged small (approximately 1.0 x 0.6 cm) contained penetrating atherosclerotic ulcer arising from the anterior left lateral aspect of the abdominal aorta. No abdominal aortic dissection or periaortic stranding. 3. Eccentric mixed calcified and noncalcified atherosclerotic plaque involving the origin and proximal aspect of the left common iliac artery likely results in at least 50% luminal narrowing. Nonvascular Impression:  1. Nonspecific abnormal wall thickening and enhancement involving the distal duodenum and proximal jejunum, not resulting in enteric obstruction and while possibly accentuated due to phase of enhancement, is associated with small amount of intra-abdominal ascites. No drainable fluid collection. Differential considerations include inflammatory, infectious and ischemic etiologies. Note, however there are no discrete filling defects within the distal vascular tree of the SMA or the celiac artery to suggest distal embolism. No evidence of pneumatosis. Clinical correlation is advised.   Electronically Signed   By: Sandi Mariscal M.D.   On: 10/07/2013 14:12     EKG Interpretation   Date/Time:  Wednesday October 07 2013 10:10:46 EDT Ventricular Rate:  92 PR Interval:  152 QRS Duration: 99 QT  Interval:  408 QTC Calculation: 505 R Axis:   74 Text Interpretation:  Sinus rhythm Biatrial enlargement Minimal ST  elevation, anterior leads Prolonged QT interval Similar to previous  Confirmed by Alahni Varone  MD, Krysti Hickling ([id number]) on 10/07/2013 3:31:38 PM      MDM   Final diagnoses:  Essential hypertension  Lower abdominal pain  Colitis  Renal artery stenosis  Hypokalemia  Patient with moderate to severe abdominal pain history of dissection an elderly female. Bedside ultrasound done no aneurysm seen, chronic dissection visualized. Pain medicines and labs performed. CT angina pending.  Patient's pain controlled on recheck. Blood pressure still elevated significantly. Labetalol ordered. Patient denies chest pain or shortness of breath. Plan for labetalol, oral fluids challenge and recheck of vitals. Patient feels comfortable going home and following up with her primary Dr. and gastroenterology for further evaluation of thickening and likely colonoscopy.  Patient's blood pressure not controlled in ER. On recheck patient feels well and says she's been taking her medicines regularly. I discussed observation the hospital for blood pressure management and further workup of her abnormal CT scan. Patient has capacity make  decisions and prefers outpatient followup. Labetalol ordered which improved her blood pressure. Patient will followup with her primary Dr.   Results and differential diagnosis were discussed with the patient/parent/guardian. Close follow up outpatient was discussed, comfortable with the plan.   Medications  labetalol (NORMODYNE,TRANDATE) injection 10 mg (not administered)  ondansetron (ZOFRAN) injection 4 mg (4 mg Intravenous Given 10/07/13 1034)  sodium chloride 0.9 % bolus 500 mL (0 mLs Intravenous Stopped 10/07/13 1300)  iohexol (OMNIPAQUE) 350 MG/ML injection 100 mL (100 mLs Intravenous Contrast Given 10/07/13 1331)  morphine 4 MG/ML injection 4 mg (4 mg Intravenous Given 10/07/13 1357)   potassium chloride SA (K-DUR,KLOR-CON) CR tablet 40 mEq (40 mEq Oral Given 10/07/13 1427)  labetalol (NORMODYNE,TRANDATE) injection 10 mg (10 mg Intravenous Given 10/07/13 1543)    Filed Vitals:   10/07/13 1542 10/07/13 1549 10/07/13 1555 10/07/13 1605  BP: 212/113 189/84 172/88 204/105  Pulse: 106  87 88  Temp:      TempSrc:      Resp: 16  17 15   SpO2: 96% 97% 95% 96%         Mariea Clonts, MD 10/07/13 1600  Mariea Clonts, MD 10/07/13 1615

## 2013-10-07 NOTE — ED Notes (Signed)
Patient transported to CT 

## 2013-10-07 NOTE — Discharge Instructions (Signed)
Take your high blood pressure medications regularly. Followup with her primary Dr. for blood pressure management and reassessment. Followup with gastroenterology for thickening of your colon. Return to the ER if your symptoms worsen. For severe pain take norco or vicodin however realize they have the potential for addiction and it can make you sleepy and has tylenol in it.  No operating machinery while taking.  If you were given medicines take as directed.  If you are on coumadin or contraceptives realize their levels and effectiveness is altered by many different medicines.  If you have any reaction (rash, tongues swelling, other) to the medicines stop taking and see a physician.   Please follow up as directed and return to the ER or see a physician for new or worsening symptoms.  Thank you. Filed Vitals:   10/07/13 1300 10/07/13 1500 10/07/13 1542 10/07/13 1549  BP: 232/113  212/113 189/84  Pulse: 111 113 106   Temp:      TempSrc:      Resp: 22 17 16    SpO2: 98% 95% 96% 97%

## 2013-10-15 ENCOUNTER — Encounter: Payer: Self-pay | Admitting: Vascular Surgery

## 2013-10-16 ENCOUNTER — Encounter (HOSPITAL_COMMUNITY): Payer: Medicare Other

## 2013-10-16 ENCOUNTER — Encounter: Payer: Medicare Other | Admitting: Vascular Surgery

## 2014-03-18 ENCOUNTER — Ambulatory Visit: Payer: Medicare Other | Admitting: Internal Medicine

## 2014-03-19 ENCOUNTER — Telehealth: Payer: Self-pay | Admitting: Internal Medicine

## 2014-03-19 NOTE — Telephone Encounter (Signed)
Ok for re-schedule at her convenience

## 2014-03-19 NOTE — Telephone Encounter (Signed)
Number not in service.

## 2014-03-19 NOTE — Telephone Encounter (Signed)
No showed for 6 month fu.  Please advise.

## 2014-07-30 ENCOUNTER — Other Ambulatory Visit: Payer: Self-pay | Admitting: Internal Medicine

## 2014-12-16 ENCOUNTER — Other Ambulatory Visit: Payer: Self-pay | Admitting: Internal Medicine

## 2015-01-05 ENCOUNTER — Ambulatory Visit: Payer: Medicare Other | Admitting: Internal Medicine

## 2015-01-06 ENCOUNTER — Other Ambulatory Visit (INDEPENDENT_AMBULATORY_CARE_PROVIDER_SITE_OTHER): Payer: Medicare Other

## 2015-01-06 ENCOUNTER — Ambulatory Visit (INDEPENDENT_AMBULATORY_CARE_PROVIDER_SITE_OTHER): Payer: Medicare Other | Admitting: Internal Medicine

## 2015-01-06 VITALS — BP 182/90 | HR 90 | Temp 98.7°F | Ht 65.0 in | Wt 103.0 lb

## 2015-01-06 DIAGNOSIS — J449 Chronic obstructive pulmonary disease, unspecified: Secondary | ICD-10-CM | POA: Diagnosis not present

## 2015-01-06 DIAGNOSIS — R413 Other amnesia: Secondary | ICD-10-CM

## 2015-01-06 DIAGNOSIS — M542 Cervicalgia: Secondary | ICD-10-CM | POA: Diagnosis not present

## 2015-01-06 DIAGNOSIS — I10 Essential (primary) hypertension: Secondary | ICD-10-CM | POA: Diagnosis not present

## 2015-01-06 MED ORDER — TRAMADOL HCL 50 MG PO TABS: 50.0000 mg | ORAL_TABLET | Freq: Four times a day (QID) | ORAL | Status: DC | PRN

## 2015-01-06 MED ORDER — AMLODIPINE BESYLATE 10 MG PO TABS: 10.0000 mg | ORAL_TABLET | Freq: Every day | ORAL | Status: DC

## 2015-01-06 MED ORDER — ATENOLOL 50 MG PO TABS: 50.0000 mg | ORAL_TABLET | Freq: Every day | ORAL | Status: AC

## 2015-01-06 NOTE — Progress Notes (Signed)
Pre visit review using our clinic review tool, if applicable. No additional management support is needed unless otherwise documented below in the visit note. 

## 2015-01-06 NOTE — Progress Notes (Signed)
Subjective:    Patient ID: [name], female    DOB: 1940-12-31, 74 y.o.   MRN: [id number]  HPI  Here with family who assist with hx, pt with worsening memory loss in last 6 months,  Pt denies chest pain, increasing sob or doe, wheezing, orthopnea, PND, increased LE swelling, palpitations, dizziness or syncope.  Pt denies new neurological symptoms such as new headache, or facial or extremity weakness or numbness.  Pt denies polydipsia, polyuria, or low sugar episode.   Pt denies new neurological symptoms such as new facial or extremity weakness or numbness.   Family states overall good compliance with meds, mostly trying to follow appropriate diet, with wt overall stable,  but little exercise however. C/o persistent chronic post bilat neck pain with radiation occipital and crown.   Past Medical History  Diagnosis Date  . THYROID NODULE, RIGHT 02/01/2009  . GLUCOSE INTOLERANCE 11/04/2007  . HYPERLIPIDEMIA 02/12/2007  . HYPOKALEMIA 11/04/2007  . ANXIETY 02/12/2007  . DEPRESSION 02/12/2007  . HYPERTENSION 11/07/2006  . CORONARY ARTERY DISEASE 11/04/2007  . CAROTID ARTERY STENOSIS, BILATERAL 02/24/2007  . Unspecified Peripheral Vascular Disease 02/12/2007  . ASTHMATIC BRONCHITIS, ACUTE 04/22/2008  . ALLERGIC RHINITIS 02/12/2007  . COPD 11/04/2007  . GERD 02/12/2007  . CONSTIPATION 02/12/2007  . DEGENERATIVE JOINT DISEASE, FINGERS 03/04/2008  . LOW BACK PAIN, CHRONIC 02/01/2009  . BACK PAIN, RIGHT 12/05/2009  . LEG PAIN, BILATERAL 02/01/2009  . OSTEOPOROSIS 02/12/2007  . DIZZINESS 02/12/2007  . INSOMNIA-SLEEP DISORDER-UNSPEC 08/04/2007  . FATIGUE 02/12/2007  . CHEST PAIN 02/12/2007  . Coronary artery disease 9/09    nonobstructive by cath  . Peripheral vascular disease 9/09    left carotid 60-80%   . Impaired glucose tolerance 05/11/2010  . Leg pain, bilateral 05/12/2010  . Syncope and collapse   . Essential hypertension, benign 06/17/2012   Past Surgical History  Procedure Laterality Date  . S/p bowel  obstruction    . Abdominal hysterectomy    . Appendectomy    . Oophorectomy    . Tonsillectomy    . S/p right cea  2/09  . S/p right thyroid nodule biopsy  March 2011    negative  . Carotid endarterectomy    . Cardiac catheterization      reports that she has been smoking.  She uses smokeless tobacco. She reports that she drinks alcohol. She reports that she does not use illicit drugs. family history includes Alcohol abuse in her other; Cancer in her son; Diabetes in her other; Heart attack in her other; Hypertension in her other; Stroke in her other. Allergies  Allergen Reactions  . Tramadol Other (See Comments)    constipation   Current Outpatient Prescriptions on File Prior to Visit  Medication Sig Dispense Refill  . aspirin 81 MG tablet Take 81 mg by mouth daily.    Marland Kitchen atorvastatin (LIPITOR) 20 MG tablet Take 1 tablet (20 mg total) by mouth daily at 6 PM. ---patient needs office visit before any further refills 90 tablet 0  . Aspirin-Salicylamide-Caffeine (BC HEADACHE POWDER PO) Take 1 packet by mouth daily as needed (for pain).    . feeding supplement, ENSURE COMPLETE, (ENSURE COMPLETE) LIQD Take 237 mLs by mouth every other day.     No current facility-administered medications on file prior to visit.   Review of Systems  Constitutional: Negative for unusual diaphoresis or night sweats HENT: Negative for ringing in ear or discharge Eyes: Negative for double vision or worsening visual disturbance.  Respiratory: Negative for choking and stridor.   Gastrointestinal: Negative for vomiting or other signifcant bowel change Genitourinary: Negative for hematuria or change in urine volume.  Musculoskeletal: Negative for other MSK pain or swelling Skin: Negative for color change and worsening wound.  Neurological: Negative for tremors and numbness other than noted  Psychiatric/Behavioral: Negative for decreased concentration or agitation other than above       Objective:   Physical  Exam BP 182/90 mmHg  Pulse 90  Temp(Src) 98.7 F (37.1 C) (Oral)  Ht 5\' 5"  (1.651 m)  Wt 103 lb (46.72 kg)  BMI 17.14 kg/m2  SpO2 96% VS noted,  Constitutional: Pt appears in no significant distress HENT: Head: NCAT.  Right Ear: External ear normal.  Left Ear: External ear normal.  Eyes: . Pupils are equal, round, and reactive to light. Conjunctivae and EOM are normal Neck: Normal range of motion. Neck supple.  Cardiovascular: Normal rate and regular rhythm.   Pulmonary/Chest: Effort normal and breath sounds without rales or wheezing.  Abd:  Soft, NT, ND, + BS Neurological: Pt is alert. + confused , cn 2-12 intact, motor 5/5 intact, gait intact Spine nontender with reduced extension cervical Skin: Skin is warm. No rash, no LE edema Psychiatric: Pt behavior is normal. No agitation.     Assessment & Plan:

## 2015-01-06 NOTE — Patient Instructions (Signed)
Please take all new medication as prescribed - the tramadol pain medication  OK to increase the amlodipine to 10 mg for blood pressure  Please continue all other medications as before, and refills have been done if requested including the atenolol  Please have the pharmacy call with any other refills you may need.  Please continue your efforts at being more active, low cholesterol diet, and weight control.  Please keep your appointments with your specialists as you may have planned  You will be contacted regarding the referral for: MRI for the head, and Neurology  Please go to the LAB in the Basement (turn left off the elevator) for the tests to be done today  You will be contacted by phone if any changes need to be made immediately.  Otherwise, you will receive a letter about your results with an explanation, but please check with MyChart first.  Please remember to sign up for MyChart if you have not done so, as this will be important to you in the future with finding out test results, communicating by private email, and scheduling acute appointments online when needed.  Please return in 6 months, or sooner if needed

## 2015-01-07 ENCOUNTER — Encounter: Payer: Self-pay | Admitting: Internal Medicine

## 2015-01-07 LAB — VITAMIN B12: VITAMIN B 12: 408 pg/mL (ref 211–911)

## 2015-01-08 NOTE — Assessment & Plan Note (Addendum)
Worsening, for b12, MRI head, refer neuro per family request, consider aricept,  to f/u any worsening symptoms or concerns  Note:  Total time for pt hx, exam, review of record with pt in the room, determination of diagnoses and plan for further eval and tx is > 40 min, with over 50% spent in coordination and counseling of patient

## 2015-01-08 NOTE — Assessment & Plan Note (Signed)
stable overall by history and exam, recent data reviewed with pt, and pt to continue medical treatment as before,  to f/u any worsening symptoms or concerns Lab Results  Component Value Date   LDLCALC 81 07/09/2012   For f/u lab, cont diet

## 2015-01-08 NOTE — Assessment & Plan Note (Signed)
stable overall by history and exam, recent data reviewed with pt, and pt to continue medical treatment as before,  to f/u any worsening symptoms or concerns SpO2 Readings from Last 3 Encounters:  01/06/15 96%  10/07/13 96%  09/18/13 98%

## 2015-01-08 NOTE — Assessment & Plan Note (Signed)
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

## 2015-01-08 NOTE — Assessment & Plan Note (Signed)
Mild uncontrolled, asympt o/w stable overall by history and exam, recent data reviewed with pt, and pt to continue medical treatment as before except increase amlodipine 10 mg qd,  to f/u any worsening symptoms or concerns

## 2015-01-08 NOTE — Assessment & Plan Note (Signed)
stable overall by history and exam, recent data reviewed with pt, and pt to continue medical treatment as before,  to f/u any worsening symptoms or concerns Lab Results  Component Value Date   HGBA1C 5.5 07/09/2012   For f/u lab

## 2015-01-27 ENCOUNTER — Telehealth: Payer: Self-pay | Admitting: Internal Medicine

## 2015-01-27 ENCOUNTER — Ambulatory Visit
Admission: RE | Admit: 2015-01-27 | Discharge: 2015-01-27 | Disposition: A | Discharge status: Home / Self Care | Payer: Medicare Other | Source: Ambulatory Visit | Attending: Internal Medicine | Admitting: Internal Medicine

## 2015-01-27 DIAGNOSIS — C801 Malignant (primary) neoplasm, unspecified: Secondary | ICD-10-CM

## 2015-01-27 DIAGNOSIS — R413 Other amnesia: Secondary | ICD-10-CM

## 2015-01-27 DIAGNOSIS — C770 Secondary and unspecified malignant neoplasm of lymph nodes of head, face and neck: Secondary | ICD-10-CM

## 2015-01-27 NOTE — Telephone Encounter (Signed)
Spoke to [employer], Curator - (son is next of kin but is in hospital at this time); pt has not designated POA per Ms Lorriane Shire  D/w her the MRI results - likely severe malignancy, ? ENT related  For ENT referral, urgent, consider oncology as well

## 2015-01-27 NOTE — Telephone Encounter (Signed)
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 Nasopharyngeal related tumor vs bone related,  But suggests ENT referral at minimum

## 2015-01-28 ENCOUNTER — Telehealth: Payer: Self-pay | Admitting: Internal Medicine

## 2015-01-28 MED ORDER — HYDROCODONE-ACETAMINOPHEN 7.5-325 MG PO TABS: 1.0000 | ORAL_TABLET | Freq: Four times a day (QID) | ORAL | Status: DC | PRN

## 2015-01-28 NOTE — Telephone Encounter (Signed)
Pt's EC informed, Rx in cabinet for pick up

## 2015-01-28 NOTE — Telephone Encounter (Signed)
I assume she means the tramadol from dec 8 is not working  OK for hydrocodone 7.5 - Done hardcopy to [employer]

## 2015-01-28 NOTE — Telephone Encounter (Signed)
Pt's daughter called want to see if Dr. Jenny Reichmann can give Mrs. Handley something for her pain due to this diagnoses. Please give her a call back

## 2015-02-01 ENCOUNTER — Other Ambulatory Visit: Payer: Self-pay | Admitting: Otolaryngology

## 2015-02-01 ENCOUNTER — Encounter (HOSPITAL_BASED_OUTPATIENT_CLINIC_OR_DEPARTMENT_OTHER): Payer: Self-pay | Admitting: *Deleted

## 2015-02-01 NOTE — H&P (Signed)
PREOPERATIVE H&P  Chief Complaint: headache  HPI: [name] is a 75 y.o. female who presents for evaluation of headache and nasopharyngeal mass noted on recent CT scan. She's taken to the OR for DL and biopsy of nasopharyngeal mass.   Past Medical History  Diagnosis Date  . THYROID NODULE, RIGHT 02/01/2009  . GLUCOSE INTOLERANCE 11/04/2007  . HYPERLIPIDEMIA 02/12/2007  . HYPOKALEMIA 11/04/2007  . ANXIETY 02/12/2007  . HYPERTENSION 11/07/2006  . CORONARY ARTERY DISEASE 11/04/2007  . CAROTID ARTERY STENOSIS, BILATERAL 02/24/2007  . Unspecified Peripheral Vascular Disease 02/12/2007  . ASTHMATIC BRONCHITIS, ACUTE 04/22/2008  . ALLERGIC RHINITIS 02/12/2007  . COPD 11/04/2007  . GERD 02/12/2007  . CONSTIPATION 02/12/2007  . LOW BACK PAIN, CHRONIC 02/01/2009  . BACK PAIN, RIGHT 12/05/2009  . LEG PAIN, BILATERAL 02/01/2009  . OSTEOPOROSIS 02/12/2007  . DIZZINESS 02/12/2007  . INSOMNIA-SLEEP DISORDER-UNSPEC 08/04/2007  . FATIGUE 02/12/2007  . CHEST PAIN 02/12/2007  . Coronary artery disease 9/09    nonobstructive by cath  . Peripheral vascular disease ([place]) 9/09    left carotid 60-80%   . Impaired glucose tolerance 05/11/2010  . Leg pain, bilateral 05/12/2010  . Syncope and collapse   . Essential hypertension, benign 06/17/2012  . Shortness of breath dyspnea     with exertion, sleeps on 2 pillows  . DEGENERATIVE JOINT DISEASE, FINGERS 03/04/2008    knees   Past Surgical History  Procedure Laterality Date  . S/p bowel obstruction    . Abdominal hysterectomy    . Appendectomy    . Oophorectomy    . Tonsillectomy    . S/p right cea  2/09  . S/p right thyroid nodule biopsy  March 2011    negative  . Carotid endarterectomy    . Cardiac catheterization     Social History   Social History  . Marital Status: Widowed    Spouse Name: N/A  . Number of Children: 2  . Years of Education: N/A   Occupational History  . 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  Smoking status: Current Every Day Smoker -- 0.50 packs/day  . Smokeless tobacco: Current User  . Alcohol Use: Yes     Comment: beer occasionally  . Drug Use: No  . Sexual Activity: Not Asked   Other Topics Concern  . None   Social History Narrative   Family History  Problem Relation Age of Onset  . Cancer Son     Possible colon cancer  . Heart attack Other   . Stroke Other   . Alcohol abuse Other   . Diabetes Other   . Hypertension Other    Allergies  Allergen Reactions  . Tramadol Other (See Comments)    constipation   Prior to Admission medications   Medication Sig Start Date End Date Taking? Authorizing Provider  amLODipine (NORVASC) 10 MG tablet Take 1 tablet (10 mg total) by mouth daily. 01/06/15  Yes Biagio Borg, MD  aspirin 81 MG tablet Take 81 mg by mouth daily.   Yes Historical Provider, MD  atenolol (TENORMIN) 50 MG tablet Take 1 tablet (50 mg total) by mouth daily. 01/06/15  Yes Biagio Borg, MD  atorvastatin (LIPITOR) 20 MG tablet Take 1 tablet (20 mg total) by mouth daily at 6 PM. ---patient needs office visit before any further refills 12/16/14  Yes Biagio Borg, MD  HYDROcodone-acetaminophen ([place]) 7.5-325 MG tablet Take 1 tablet by mouth every 6 (six) hours  as needed for moderate pain. 01/28/15  Yes Biagio Borg, MD  Aspirin-Salicylamide-Caffeine [facility] HEADACHE POWDER PO) Take 1 packet by mouth daily as needed (for pain).    Historical Provider, MD     Positive ROS: no trouble breathing or any bleeding from nose  All other systems have been reviewed and were otherwise negative with the exception of those mentioned in the HPI and as above.  Physical Exam: There were no vitals filed for this visit.  General: Alert, no acute distress Oral: Normal oral mucosa and tonsils Nasal: Clear nasal passages. Nasopharyngoscopy reveals ulcerative mass in nasopharynx Neck: No palpable adenopathy or thyroid nodules Ear: Ear canal is clear with normal appearing  TMs Cardiovascular: Regular rate and rhythm, no murmur.  Respiratory: Clear to auscultation Neurologic: Alert and oriented x 3   Assessment/Plan: nasopharyngeal mass Plan for Procedure(s): DIRECT LARYNGOSCOPY WITH BIOPSY   Melony Overly, MD 02/01/2015 4:57 PM

## 2015-02-02 ENCOUNTER — Telehealth: Payer: Self-pay | Admitting: Hematology and Oncology

## 2015-02-02 NOTE — Telephone Encounter (Signed)
Pt aware of appt. 02/09/15 @11 :30

## 2015-02-03 ENCOUNTER — Telehealth: Payer: Self-pay | Admitting: *Deleted

## 2015-02-03 ENCOUNTER — Encounter (HOSPITAL_BASED_OUTPATIENT_CLINIC_OR_DEPARTMENT_OTHER)
Admission: RE | Admit: 2015-02-03 | Discharge: 2015-02-03 | Disposition: A | Discharge status: Home / Self Care | Payer: Medicare Other | Source: Ambulatory Visit | Attending: Otolaryngology | Admitting: Otolaryngology

## 2015-02-03 ENCOUNTER — Other Ambulatory Visit: Payer: Self-pay

## 2015-02-03 DIAGNOSIS — E785 Hyperlipidemia, unspecified: Secondary | ICD-10-CM | POA: Diagnosis not present

## 2015-02-03 DIAGNOSIS — I739 Peripheral vascular disease, unspecified: Secondary | ICD-10-CM | POA: Diagnosis not present

## 2015-02-03 DIAGNOSIS — G8929 Other chronic pain: Secondary | ICD-10-CM | POA: Diagnosis not present

## 2015-02-03 DIAGNOSIS — K219 Gastro-esophageal reflux disease without esophagitis: Secondary | ICD-10-CM | POA: Diagnosis not present

## 2015-02-03 DIAGNOSIS — M810 Age-related osteoporosis without current pathological fracture: Secondary | ICD-10-CM | POA: Diagnosis not present

## 2015-02-03 DIAGNOSIS — M549 Dorsalgia, unspecified: Secondary | ICD-10-CM | POA: Diagnosis not present

## 2015-02-03 DIAGNOSIS — Z90710 Acquired absence of both cervix and uterus: Secondary | ICD-10-CM | POA: Diagnosis not present

## 2015-02-03 DIAGNOSIS — D3705 Neoplasm of uncertain behavior of pharynx: Secondary | ICD-10-CM | POA: Diagnosis present

## 2015-02-03 DIAGNOSIS — C113 Malignant neoplasm of anterior wall of nasopharynx: Secondary | ICD-10-CM | POA: Diagnosis not present

## 2015-02-03 DIAGNOSIS — Z79899 Other long term (current) drug therapy: Secondary | ICD-10-CM | POA: Diagnosis not present

## 2015-02-03 DIAGNOSIS — I2510 Atherosclerotic heart disease of native coronary artery without angina pectoris: Secondary | ICD-10-CM | POA: Diagnosis not present

## 2015-02-03 DIAGNOSIS — F17200 Nicotine dependence, unspecified, uncomplicated: Secondary | ICD-10-CM | POA: Diagnosis not present

## 2015-02-03 DIAGNOSIS — Z8249 Family history of ischemic heart disease and other diseases of the circulatory system: Secondary | ICD-10-CM | POA: Diagnosis not present

## 2015-02-03 DIAGNOSIS — J449 Chronic obstructive pulmonary disease, unspecified: Secondary | ICD-10-CM | POA: Diagnosis not present

## 2015-02-03 DIAGNOSIS — I10 Essential (primary) hypertension: Secondary | ICD-10-CM | POA: Diagnosis not present

## 2015-02-03 DIAGNOSIS — I6523 Occlusion and stenosis of bilateral carotid arteries: Secondary | ICD-10-CM | POA: Diagnosis not present

## 2015-02-03 DIAGNOSIS — F419 Anxiety disorder, unspecified: Secondary | ICD-10-CM | POA: Diagnosis not present

## 2015-02-03 DIAGNOSIS — Z7982 Long term (current) use of aspirin: Secondary | ICD-10-CM | POA: Diagnosis not present

## 2015-02-03 LAB — BASIC METABOLIC PANEL
Anion gap: 12 (ref 5–15)
BUN: 13 mg/dL (ref 6–20)
CALCIUM: 10 mg/dL (ref 8.9–10.3)
CHLORIDE: 97 mmol/L — AB (ref 101–111)
CO2: 32 mmol/L (ref 22–32)
CREATININE: 0.9 mg/dL (ref 0.44–1.00)
GFR calc non Af Amer: >60 mL/min (ref >60)
Glucose, Bld: 149 mg/dL — ABNORMAL HIGH (ref 65–99)
Potassium: 3.1 mmol/L — ABNORMAL LOW (ref 3.5–5.1)
SODIUM: 141 mmol/L (ref 135–145)

## 2015-02-03 NOTE — Telephone Encounter (Signed)
  Oncology Nurse Navigator Documentation Navigator Location: [facility] (02/03/15 1636) Navigator Encounter Type: Introductory phone call (02/03/15 1636)   Abnormal Finding Date: 01/27/15 (02/03/15 1636)           Barriers/Navigation Needs: No barriers at this time (02/03/15 1636)       Placed introductory call to new referral patient.  Spoke with her granddaughter [name]. 1. Introduced myself as the oncology nurse navigator that works with Drs. Alvy Bimler and Isidore Moos to whom her grandmother has been referred by Dr. Lucia Gaskins. 2. She confirmed understanding of referral and 02/09/15 1200 appt date/time with Dr. Alvy Bimler, I informed her of 02/16/15 0900 appt with Dr. Isidore Moos. 3. I briefly explained my role as a navigator, indicated that I would be joining them during upcoming consult appts. 4. I confirmed understanding of the [facility] location, explained arrival and registration process. 5. I provided my contact information, encouraged her to call with questions/concerns before next week. She verbalized understanding of information provided, expressed appreciation for my call.  Gayleen Orem, RN, BSN, [place] at [place] [phone]                           Time Spent with Patient: 15 (02/03/15 1636)

## 2015-02-04 ENCOUNTER — Encounter (HOSPITAL_BASED_OUTPATIENT_CLINIC_OR_DEPARTMENT_OTHER)
Admission: RE | Disposition: A | Discharge status: Home / Self Care | Payer: Self-pay | Source: Ambulatory Visit | Attending: Otolaryngology

## 2015-02-04 ENCOUNTER — Ambulatory Visit (HOSPITAL_BASED_OUTPATIENT_CLINIC_OR_DEPARTMENT_OTHER): Payer: Medicare Other | Admitting: Certified Registered"

## 2015-02-04 ENCOUNTER — Ambulatory Visit (HOSPITAL_BASED_OUTPATIENT_CLINIC_OR_DEPARTMENT_OTHER)
Admission: RE | Admit: 2015-02-04 | Discharge: 2015-02-04 | Disposition: A | Discharge status: Home / Self Care | Payer: Medicare Other | Source: Ambulatory Visit | Attending: Otolaryngology | Admitting: Otolaryngology

## 2015-02-04 ENCOUNTER — Encounter (HOSPITAL_BASED_OUTPATIENT_CLINIC_OR_DEPARTMENT_OTHER): Payer: Self-pay

## 2015-02-04 DIAGNOSIS — E785 Hyperlipidemia, unspecified: Secondary | ICD-10-CM | POA: Diagnosis not present

## 2015-02-04 DIAGNOSIS — C113 Malignant neoplasm of anterior wall of nasopharynx: Secondary | ICD-10-CM | POA: Insufficient documentation

## 2015-02-04 DIAGNOSIS — I10 Essential (primary) hypertension: Secondary | ICD-10-CM | POA: Insufficient documentation

## 2015-02-04 DIAGNOSIS — M810 Age-related osteoporosis without current pathological fracture: Secondary | ICD-10-CM | POA: Insufficient documentation

## 2015-02-04 DIAGNOSIS — Z7982 Long term (current) use of aspirin: Secondary | ICD-10-CM | POA: Insufficient documentation

## 2015-02-04 DIAGNOSIS — I739 Peripheral vascular disease, unspecified: Secondary | ICD-10-CM | POA: Insufficient documentation

## 2015-02-04 DIAGNOSIS — F419 Anxiety disorder, unspecified: Secondary | ICD-10-CM | POA: Insufficient documentation

## 2015-02-04 DIAGNOSIS — Z90710 Acquired absence of both cervix and uterus: Secondary | ICD-10-CM | POA: Insufficient documentation

## 2015-02-04 DIAGNOSIS — F17200 Nicotine dependence, unspecified, uncomplicated: Secondary | ICD-10-CM | POA: Insufficient documentation

## 2015-02-04 DIAGNOSIS — I2510 Atherosclerotic heart disease of native coronary artery without angina pectoris: Secondary | ICD-10-CM | POA: Insufficient documentation

## 2015-02-04 DIAGNOSIS — K219 Gastro-esophageal reflux disease without esophagitis: Secondary | ICD-10-CM | POA: Insufficient documentation

## 2015-02-04 DIAGNOSIS — M549 Dorsalgia, unspecified: Secondary | ICD-10-CM | POA: Insufficient documentation

## 2015-02-04 DIAGNOSIS — I6523 Occlusion and stenosis of bilateral carotid arteries: Secondary | ICD-10-CM | POA: Insufficient documentation

## 2015-02-04 DIAGNOSIS — G8929 Other chronic pain: Secondary | ICD-10-CM | POA: Insufficient documentation

## 2015-02-04 DIAGNOSIS — Z8249 Family history of ischemic heart disease and other diseases of the circulatory system: Secondary | ICD-10-CM | POA: Insufficient documentation

## 2015-02-04 DIAGNOSIS — J449 Chronic obstructive pulmonary disease, unspecified: Secondary | ICD-10-CM | POA: Insufficient documentation

## 2015-02-04 DIAGNOSIS — Z79899 Other long term (current) drug therapy: Secondary | ICD-10-CM | POA: Insufficient documentation

## 2015-02-04 HISTORY — PX: DIRECT LARYNGOSCOPY: SHX5326

## 2015-02-04 HISTORY — DX: Reserved for inherently not codable concepts without codable children: IMO0001

## 2015-02-04 SURGERY — LARYNGOSCOPY, DIRECT
Anesthesia: General | Site: Mouth

## 2015-02-04 MED ORDER — ONDANSETRON HCL 4 MG/2ML IJ SOLN
INTRAMUSCULAR | Status: DC | PRN
  Administered 2015-02-04: 4 mg via INTRAVENOUS

## 2015-02-04 MED ORDER — PROPOFOL 10 MG/ML IV BOLUS
INTRAVENOUS | Status: AC
  Filled 2015-02-04: qty 20

## 2015-02-04 MED ORDER — FENTANYL CITRATE (PF) 100 MCG/2ML IJ SOLN
25.0000 ug | INTRAMUSCULAR | Status: DC | PRN
  Administered 2015-02-04 (×3): 25 ug via INTRAVENOUS

## 2015-02-04 MED ORDER — EPINEPHRINE HCL 1 MG/ML IJ SOLN
INTRAMUSCULAR | Status: AC
  Filled 2015-02-04: qty 1

## 2015-02-04 MED ORDER — SUCCINYLCHOLINE CHLORIDE 20 MG/ML IJ SOLN
INTRAMUSCULAR | Status: AC
  Filled 2015-02-04: qty 1

## 2015-02-04 MED ORDER — HYDROCODONE-ACETAMINOPHEN 5-325 MG PO TABS: 1.0000 | ORAL_TABLET | Freq: Four times a day (QID) | ORAL | Status: DC | PRN

## 2015-02-04 MED ORDER — LACTATED RINGERS IV SOLN
INTRAVENOUS | Status: DC
  Administered 2015-02-04: 10 mL/h via INTRAVENOUS
  Administered 2015-02-04: 07:00:00 via INTRAVENOUS

## 2015-02-04 MED ORDER — CEFAZOLIN SODIUM-DEXTROSE 2-3 GM-% IV SOLR
INTRAVENOUS | Status: AC
  Filled 2015-02-04: qty 50

## 2015-02-04 MED ORDER — ARTIFICIAL TEARS OP OINT
TOPICAL_OINTMENT | OPHTHALMIC | Status: AC
  Filled 2015-02-04: qty 3.5

## 2015-02-04 MED ORDER — FENTANYL CITRATE (PF) 100 MCG/2ML IJ SOLN
INTRAMUSCULAR | Status: AC
  Filled 2015-02-04: qty 2

## 2015-02-04 MED ORDER — CEFAZOLIN SODIUM-DEXTROSE 2-3 GM-% IV SOLR
2.0000 g | INTRAVENOUS | Status: AC
  Administered 2015-02-04: 2 g via INTRAVENOUS

## 2015-02-04 MED ORDER — ONDANSETRON HCL 4 MG/2ML IJ SOLN
INTRAMUSCULAR | Status: AC
  Filled 2015-02-04: qty 2

## 2015-02-04 MED ORDER — SCOPOLAMINE 1 MG/3DAYS TD PT72: 1.0000 | MEDICATED_PATCH | Freq: Once | TRANSDERMAL | Status: DC | PRN

## 2015-02-04 MED ORDER — PROPOFOL 10 MG/ML IV BOLUS
INTRAVENOUS | Status: DC | PRN
  Administered 2015-02-04: 100 mg via INTRAVENOUS
  Administered 2015-02-04: 50 mg via INTRAVENOUS

## 2015-02-04 MED ORDER — PHENYLEPHRINE HCL 10 MG/ML IJ SOLN
INTRAMUSCULAR | Status: DC | PRN
  Administered 2015-02-04: 80 ug via INTRAVENOUS

## 2015-02-04 MED ORDER — DEXAMETHASONE SODIUM PHOSPHATE 4 MG/ML IJ SOLN
INTRAMUSCULAR | Status: DC | PRN
  Administered 2015-02-04: 5 mg via INTRAVENOUS

## 2015-02-04 MED ORDER — GLYCOPYRROLATE 0.2 MG/ML IJ SOLN: 0.2000 mg | Freq: Once | INTRAMUSCULAR | Status: DC | PRN

## 2015-02-04 MED ORDER — SUCCINYLCHOLINE CHLORIDE 20 MG/ML IJ SOLN
INTRAMUSCULAR | Status: DC | PRN
  Administered 2015-02-04: 60 mg via INTRAVENOUS

## 2015-02-04 MED ORDER — FENTANYL CITRATE (PF) 100 MCG/2ML IJ SOLN
50.0000 ug | INTRAMUSCULAR | Status: DC | PRN
  Administered 2015-02-04: 25 ug via INTRAVENOUS
  Administered 2015-02-04: 50 ug via INTRAVENOUS

## 2015-02-04 MED ORDER — DEXAMETHASONE SODIUM PHOSPHATE 10 MG/ML IJ SOLN
INTRAMUSCULAR | Status: AC
  Filled 2015-02-04: qty 1

## 2015-02-04 MED ORDER — PROMETHAZINE HCL 25 MG/ML IJ SOLN: 6.2500 mg | INTRAMUSCULAR | Status: DC | PRN

## 2015-02-04 MED ORDER — MIDAZOLAM HCL 2 MG/2ML IJ SOLN: 1.0000 mg | INTRAMUSCULAR | Status: DC | PRN

## 2015-02-04 MED ORDER — LIDOCAINE HCL (CARDIAC) 20 MG/ML IV SOLN
INTRAVENOUS | Status: AC
  Filled 2015-02-04: qty 5

## 2015-02-04 MED ORDER — PHENYLEPHRINE 40 MCG/ML (10ML) SYRINGE FOR IV PUSH (FOR BLOOD PRESSURE SUPPORT)
PREFILLED_SYRINGE | INTRAVENOUS | Status: AC
  Filled 2015-02-04: qty 10

## 2015-02-04 MED ORDER — EPINEPHRINE HCL 1 MG/ML IJ SOLN
INTRAMUSCULAR | Status: DC | PRN
  Administered 2015-02-04: 1 mg

## 2015-02-04 SURGICAL SUPPLY — 34 items
CANISTER SUCT 1200ML W/VALVE (MISCELLANEOUS) ×3 IMPLANT
CATH ROBINSON RED A/P 12FR (CATHETERS) ×2 IMPLANT
COAGULATOR SUCT 6 FR SWTCH (ELECTROSURGICAL) ×1
COAGULATOR SUCT SWTCH 10FR 6 (ELECTROSURGICAL) ×1 IMPLANT
CONT SPEC 4OZ CLIKSEAL STRL BL (MISCELLANEOUS) IMPLANT
ELECT REM PT RETURN 9FT ADLT (ELECTROSURGICAL) ×3
ELECTRODE REM PT RTRN 9FT ADLT (ELECTROSURGICAL) ×1 IMPLANT
GLOVE SS BIOGEL STRL SZ 7.5 (GLOVE) ×1 IMPLANT
GLOVE SUPERSENSE BIOGEL SZ 7.5 (GLOVE) ×2
GLOVE SURG SS PI 7.0 STRL IVOR (GLOVE) ×2 IMPLANT
GOWN STRL REUS W/ TWL LRG LVL3 (GOWN DISPOSABLE) IMPLANT
GOWN STRL REUS W/ TWL XL LVL3 (GOWN DISPOSABLE) IMPLANT
GOWN STRL REUS W/TWL LRG LVL3 (GOWN DISPOSABLE) ×3
GOWN STRL REUS W/TWL XL LVL3 (GOWN DISPOSABLE)
GUARD TEETH (MISCELLANEOUS) ×3 IMPLANT
MARKER SKIN DUAL TIP RULER LAB (MISCELLANEOUS) IMPLANT
NDL SAFETY ECLIPSE 18X1.5 (NEEDLE) ×1 IMPLANT
NDL SPNL 22GX7 QUINCKE BK (NEEDLE) IMPLANT
NEEDLE HYPO 18GX1.5 SHARP (NEEDLE) ×3
NEEDLE SPNL 22GX7 QUINCKE BK (NEEDLE) IMPLANT
NS IRRIG 1000ML POUR BTL (IV SOLUTION) ×3 IMPLANT
PATTIES SURGICAL .5 X3 (DISPOSABLE) ×3 IMPLANT
SHEET MEDIUM DRAPE 40X70 STRL (DRAPES) ×3 IMPLANT
SLEEVE SCD COMPRESS KNEE MED (MISCELLANEOUS) ×3 IMPLANT
SOLUTION ANTI FOG 6CC (MISCELLANEOUS) IMPLANT
SOLUTION BUTLER CLEAR DIP (MISCELLANEOUS) ×3 IMPLANT
SPONGE GAUZE 4X4 12PLY STER LF (GAUZE/BANDAGES/DRESSINGS) ×6 IMPLANT
SURGILUBE 2OZ TUBE FLIPTOP (MISCELLANEOUS) IMPLANT
SYR 5ML LL (SYRINGE) ×3 IMPLANT
SYR BULB 3OZ (MISCELLANEOUS) ×2 IMPLANT
SYR CONTROL 10ML LL (SYRINGE) IMPLANT
TOWEL OR 17X24 6PK STRL BLUE (TOWEL DISPOSABLE) ×3 IMPLANT
TUBE CONNECTING 20'X1/4 (TUBING) ×1
TUBE CONNECTING 20X1/4 (TUBING) ×2 IMPLANT

## 2015-02-04 NOTE — Transfer of Care (Signed)
Immediate Anesthesia Transfer of Care Note  Patient: [name]  Procedure(s) Performed: Procedure(s): DIRECT LARYNGOSCOPY WITH BIOPSY OF NASOPHARYNGEAL MASS (N/A)  Patient Location: PACU  Anesthesia Type:General  Level of Consciousness: awake, patient cooperative and confused  Airway & Oxygen Therapy: Patient Spontanous Breathing and aerosol face mask  Post-op Assessment: Report given to RN and Post -op Vital signs reviewed and stable  Post vital signs: Reviewed and stable  Last Vitals:  Filed Vitals:   02/04/15 0631  BP: 136/83  Pulse: 83  Temp: 36.6 C  Resp: 20    Complications: No apparent anesthesia complications

## 2015-02-04 NOTE — Anesthesia Preprocedure Evaluation (Signed)
Anesthesia Evaluation  Patient identified by MRN, date of birth, ID band Patient awake    Reviewed: Allergy & Precautions, NPO status , Patient's Chart, lab work & pertinent test results  Airway Mallampati: II  TM Distance: >3 FB Neck ROM: Full    Dental no notable dental hx.    Pulmonary COPD, Current Smoker,    Pulmonary exam normal breath sounds clear to auscultation       Cardiovascular hypertension, Pt. on medications Normal cardiovascular exam Rhythm:Regular Rate:Normal     Neuro/Psych negative neurological ROS  negative psych ROS   GI/Hepatic negative GI ROS, Neg liver ROS,   Endo/Other  negative endocrine ROS  Renal/GU negative Renal ROS  negative genitourinary   Musculoskeletal negative musculoskeletal ROS (+)   Abdominal   Peds negative pediatric ROS (+)  Hematology negative hematology ROS (+)   Anesthesia Other Findings   Reproductive/Obstetrics negative OB ROS                             Anesthesia Physical Anesthesia Plan  ASA: III  Anesthesia Plan: General   Post-op Pain Management:    Induction: Intravenous  Airway Management Planned: Oral ETT  Additional Equipment:   Intra-op Plan:   Post-operative Plan: Extubation in OR  Informed Consent: I have reviewed the patients History and Physical, chart, labs and discussed the procedure including the risks, benefits and alternatives for the proposed anesthesia with the patient or authorized representative who has indicated his/her understanding and acceptance.   Dental advisory given  Plan Discussed with: CRNA and Surgeon  Anesthesia Plan Comments:         Anesthesia Quick Evaluation

## 2015-02-04 NOTE — Discharge Instructions (Signed)
°  Post Anesthesia Home Care Instructions  Activity: Get plenty of rest for the remainder of the day. A responsible adult should stay with you for 24 hours following the procedure.  For the next 24 hours, DO NOT: -Drive a car -Paediatric nurse -Drink alcoholic beverages -Take any medication unless instructed by your physician -Make any legal decisions or sign important papers.  Meals: Start with liquid foods such as gelatin or soup. Progress to regular foods as tolerated. Avoid greasy, spicy, heavy foods. If nausea and/or vomiting occur, drink only clear liquids until the nausea and/or vomiting subsides. Call your physician if vomiting continues.  Special Instructions/Symptoms: Your throat may feel dry or sore from the anesthesia or the breathing tube placed in your throat during surgery. If this causes discomfort, gargle with warm salt water. The discomfort should disappear within 24 hours.  If you had a scopolamine patch placed behind your ear for the management of post- operative nausea and/or vomiting:  1. The medication in the patch is effective for 72 hours, after which it should be removed.  Wrap patch in a tissue and discard in the trash. Wash hands thoroughly with soap and water. 2. You may remove the patch earlier than 72 hours if you experience unpleasant side effects which may include dry mouth, dizziness or visual disturbances. 3. Avoid touching the patch. Wash your hands with soap and water after contact with the patch.   Call Dr Pollie Friar office next Tues between 4 and 5 or Wed am concerning results of biopsy Take your regular meds Take tylenol, motrin or hydrocodone prn pain

## 2015-02-04 NOTE — Telephone Encounter (Signed)
A user error has taken place: encounter opened in error, closed for administrative reasons.

## 2015-02-04 NOTE — Brief Op Note (Signed)
02/04/2015  8:14 AM  PATIENT:  [name]  75 y.o. female  PRE-OPERATIVE DIAGNOSIS:  nasopharyngeal mass  POST-OPERATIVE DIAGNOSIS:  nasopharyngeal mass  PROCEDURE:  Procedure(s): DIRECT LARYNGOSCOPY WITH BIOPSY (N/A)  SURGEON:  Surgeon(s) and Role:    * Rozetta Nunnery, MD - Primary  PHYSICIAN ASSISTANT:   ASSISTANTS: none   ANESTHESIA:   general  EBL:     BLOOD ADMINISTERED:none  DRAINS: none   LOCAL MEDICATIONS USED:  NONE  SPECIMEN:  Source of Specimen:  nasopharynx  DISPOSITION OF SPECIMEN:  PATHOLOGY  COUNTS:  YES  TOURNIQUET:  * No tourniquets in log *  DICTATION: .Other Dictation: Dictation Number [id number]  PLAN OF CARE: Discharge to home after PACU  PATIENT DISPOSITION:  PACU - hemodynamically stable.   Delay start of Pharmacological VTE agent (>24hrs) due to surgical blood loss or risk of bleeding: yes

## 2015-02-04 NOTE — Interval H&P Note (Signed)
History and Physical Interval Note:  02/04/2015 7:31 AM  [name]  has presented today for surgery, with the diagnosis of nasopharyngeal mass  The various methods of treatment have been discussed with the patient and family. After consideration of risks, benefits and other options for treatment, the patient has consented to  Procedure(s): DIRECT LARYNGOSCOPY WITH BIOPSY (N/A) as a surgical intervention .  The patient's history has been reviewed, patient examined, no change in status, stable for surgery.  I have reviewed the patient's chart and labs.  Questions were answered to the patient's satisfaction.     [name]

## 2015-02-04 NOTE — Anesthesia Procedure Notes (Signed)
Procedure Name: Intubation Date/Time: 02/04/2015 7:45 AM Performed by: Lyndee Leo Pre-anesthesia Checklist: Patient identified, Emergency Drugs available, Suction available and Patient being monitored Patient Re-evaluated:Patient Re-evaluated prior to inductionOxygen Delivery Method: Circle System Utilized Preoxygenation: Pre-oxygenation with 100% oxygen Intubation Type: IV induction Ventilation: Mask ventilation without difficulty Laryngoscope Size: Mac and 3 Grade View: Grade IV Tube type: Oral Tube size: 6.0 mm Number of attempts: 1 Airway Equipment and Method: Stylet and Oral airway Placement Confirmation: ETT inserted through vocal cords under direct vision,  positive ETCO2 and breath sounds checked- equal and bilateral Secured at: 21 cm Tube secured with: Tape Dental Injury: Teeth and Oropharynx as per pre-operative assessment

## 2015-02-04 NOTE — Anesthesia Postprocedure Evaluation (Signed)
Anesthesia Post Note  Patient: [name]  Procedure(s) Performed: Procedure(s) (LRB): DIRECT LARYNGOSCOPY WITH BIOPSY OF NASOPHARYNGEAL MASS (N/A)  Patient location during evaluation: PACU Anesthesia Type: General Level of consciousness: awake and alert Pain management: pain level controlled Vital Signs Assessment: post-procedure vital signs reviewed and stable Respiratory status: spontaneous breathing and respiratory function stable Cardiovascular status: blood pressure returned to baseline and stable Postop Assessment: no signs of nausea or vomiting Anesthetic complications: no    Last Vitals:  Filed Vitals:   02/04/15 0631 02/04/15 0830  BP: 136/83 251/101  Pulse: 83 86  Temp: 36.6 C   Resp: 20 18    Last Pain:  Filed Vitals:   02/04/15 0841  PainSc: 5                  Kamarii Buren S

## 2015-02-07 ENCOUNTER — Telehealth: Payer: Self-pay | Admitting: *Deleted

## 2015-02-07 ENCOUNTER — Telehealth: Payer: Self-pay | Admitting: Internal Medicine

## 2015-02-07 ENCOUNTER — Encounter (HOSPITAL_BASED_OUTPATIENT_CLINIC_OR_DEPARTMENT_OTHER): Payer: Self-pay | Admitting: Otolaryngology

## 2015-02-07 ENCOUNTER — Telehealth: Payer: Self-pay | Admitting: Hematology and Oncology

## 2015-02-07 DIAGNOSIS — C760 Malignant neoplasm of head, face and neck: Secondary | ICD-10-CM

## 2015-02-07 NOTE — Telephone Encounter (Signed)
[name] who says she's the patient's POA. Called requesting a referral for home health care.  She has been recently diagnosed with cancer and they told her to call us. She can be reached at [phone] Pt aware that you will be back in office on Tuesday and it will after that you will call her back

## 2015-02-07 NOTE — Telephone Encounter (Signed)
Pt scheduled to see Dr. Alvy Bimler on Wednesday this week.  Daughter LVM asking if pt can be r/s to later this week? She asks about Thursday?

## 2015-02-07 NOTE — Telephone Encounter (Signed)
Per 1/9 pof move patients new appointment to 1/18 at 10:00 and per pof patients daughter is aware

## 2015-02-07 NOTE — Op Note (Signed)
NAMEKENEDIE, GAJDA NO.:  [id number]  MEDICAL RECORD NO.:  [id number]  LOCATION:                                 FACILITY:  PHYSICIAN:  Leonides Sake. Lucia Gaskins, M.D. DATE OF BIRTH:  DATE OF PROCEDURE: DATE OF DISCHARGE:                              OPERATIVE REPORT   PREOPERATIVE DIAGNOSIS:  Nasopharyngeal mass.  POSTOPERATIVE DIAGNOSIS:  Nasopharyngeal mass.  OPERATION PERFORMED:  Direct laryngoscopy and biopsy of nasopharyngeal mass.  SURGEON:  Leonides Sake. Lucia Gaskins, M.D.  ANESTHESIA:  General endotracheal.  COMPLICATIONS:  None.  BRIEF CLINICAL NOTE:  [name] is a 75 year old female, who has been having problems with headaches and some mental status changes.  She underwent an MRI scan, which showed an ulcer of the nasopharyngeal mass, which was penetrating and causing osteolytic changes into the skull base and clivus.  This was consistent with probable carcinoma.  She has had no trouble breathing through her nose and no bleeding from the nose. She was taken to the operating room at this time for biopsy of the nasopharyngeal mass.  DESCRIPTION OF PROCEDURE:  After adequate endotracheal anesthesia, first direct laryngoscopy was performed.  The oropharynx, tonsillar areas, base of tongue, vallecula, epiglottis, false and true cords were all clear.  Next, a red rubber catheter was passed through the nose and out from the mouth and the nasopharynx was examined with a mirror.  The patient had an eroding ulcerative mass of the nasopharynx.  Using cup forceps, several biopsies of the nasopharyngeal mass were obtained and sent to Pathology in formalin.  Hemostasis was obtained with suction cautery and cotton pledgets soaked in adrenaline.  After obtaining adequate hemostasis, the nasopharynx was irrigated with saline.  There was minimal bleeding.  The patient was subsequently awakened from anesthesia and transferred to the recovery room postoperatively  doing well.  DISPOSITION:  [name] was discharged home later this morning.  Her daughter will call next week concerning results of the biopsy and she has appointment scheduled with Radiation Oncology in a week and half for further evaluation and possible treatment of the nasopharyngeal mass.          ______________________________ Leonides Sake Lucia Gaskins, M.D.     CEN/MEDQ  D:  02/04/2015  T:  02/04/2015  Job:  [id number]  cc:   Biagio Borg, MD

## 2015-02-07 NOTE — Telephone Encounter (Signed)
  Oncology Nurse Navigator Documentation Navigator Location: [facility] (02/07/15 1155) Navigator Encounter Type: Telephone (02/07/15 1155) Telephone: [place] Call (02/07/15 1155)             Barriers/Navigation Needs: Coordination of Care (02/07/15 1155)     Spoke with patient's granddaughter [name] in follow-up to her call to Dr. Calton Dach office for rescheduling of this [facility] appt.    I noted appt has been rescheduled for next Wed at 1000 following appt with Dr. Isidore Moos.  She verbalized understanding, indicated schedule is better for her grandmother.  She understands I can be contacted with further questions/concerns prior to next week's appts.  Gayleen Orem, RN, BSN, [place] at [place] [phone]                         Time Spent with Patient: 15 (02/07/15 1155)

## 2015-02-07 NOTE — Telephone Encounter (Signed)
Pls reschedule to next week at 10 am after she sees Dr. Stark Bray, please coordinate care

## 2015-02-08 NOTE — Telephone Encounter (Signed)
Referral done

## 2015-02-08 NOTE — Telephone Encounter (Signed)
Pt aware.

## 2015-02-08 NOTE — Progress Notes (Signed)
Head and Neck Cancer Location of Tumor / Histology:  02/04/15 Diagnosis Nasopharynx, biopsy - INVASIVE SQUAMOUS CELL CARCINOMA  Patient presented with symptoms of: [name] had been having problems with headaches and some mental status changes. She underwent an MRI scan, which showed an ulcer of the nasopharyngeal mass, which was penetrating and causing osteolytic changes into the skull base and clivus.  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   Nutrition Status Yes No Comments  Weight changes? [x]  []  She has lost weight. Today she is 90.5, and per her grandaughter she was 93 lbs on 02/04/15  Swallowing concerns? [x]  []  She has swallowing issues related to pain in her throat  PEG? []  [x]     Referrals Yes No Comments  Social Work? []  [x]  She will be referred today based on her distress screening  Dentistry? []  [x]    Swallowing therapy? []  [x]    Nutrition? []  [x]    Med/Onc? [x]  []  02/16/15 10:00 with Dr. Alvy Bimler   Safety Issues Yes No Comments  Prior radiation? []  [x]    Pacemaker/ICD? []  [x]    Possible current pregnancy? []  [x]    Is the patient on methotrexate? []  [x]     Tobacco/Marijuana/Snuff/ETOH use: Current every day smoker of 1/2 pack daily  Past/Anticipated interventions by otolaryngology, if any: 02/04/15 Direct laryngoscopy and biopsy of nasopharyngeal mass by Dr. Melony Overly  Past/Anticipated interventions by medical oncology, if any: She has an appointment with Dr. Alvy Bimler today (02/16/15 at 10:00).     Current Complaints / other details:  She is here with her granddaughter today who is very helpful and involved in the patients care. She has a cough, and reports she coughs up white/yellow sputum. Her granddaughter reports in the recent past she has had small red spots in her sputum. Her main issue is pain, which she rates a 9/10 on the pain scale. She is unable to swallow because of this pain, and is not eating or drinking  much. She also has a constant headache which is over her face area, and extends to her shoulders. She takes 7.5 mg of hydrocodone every 4 hours for the past several days. She does have difficulty swallowing her pills, and has to try several times for it to go down.

## 2015-02-09 ENCOUNTER — Ambulatory Visit: Payer: Medicare Other | Admitting: Hematology and Oncology

## 2015-02-10 ENCOUNTER — Telehealth: Payer: Self-pay | Admitting: *Deleted

## 2015-02-10 ENCOUNTER — Other Ambulatory Visit (HOSPITAL_COMMUNITY): Payer: Self-pay | Admitting: Otolaryngology

## 2015-02-10 DIAGNOSIS — C111 Malignant neoplasm of posterior wall of nasopharynx: Secondary | ICD-10-CM

## 2015-02-10 NOTE — Telephone Encounter (Addendum)
  Oncology Nurse Navigator Documentation Navigator Location: [facility] (02/10/15 1200) Navigator Encounter Type: Telephone (02/10/15 1200) Telephone: [place] [facility] Call (02/10/15 1200)             Barriers/Navigation Needs: Coordination of Care (02/10/15 1200)       Following repeat phone conversations with Imagene Riches, Interim Director of Radiology, who arranged rescheduling of Ms Kohout 02/22/15 PET, I spoke with Ms Schmuhl granddaughter, [name].    Informed her of adjusted PET appt for 02/14/15 at 1:30, 1:10 arrival, NPO after 6 AM.    I provided directions to [facility].    She verbalized understanding of information. I encouraged her to call me with any questions prior to this or next Wed's appts.  Gayleen Orem, RN, BSN, [place] at [place] [phone]                       Time Spent with Patient: 30 (02/10/15 1200)

## 2015-02-14 ENCOUNTER — Ambulatory Visit (HOSPITAL_COMMUNITY)
Admission: RE | Admit: 2015-02-14 | Discharge: 2015-02-14 | Disposition: A | Discharge status: Home / Self Care | Payer: Medicare Other | Source: Ambulatory Visit | Attending: Otolaryngology | Admitting: Otolaryngology

## 2015-02-14 DIAGNOSIS — C969 Malignant neoplasm of lymphoid, hematopoietic and related tissue, unspecified: Secondary | ICD-10-CM | POA: Insufficient documentation

## 2015-02-14 DIAGNOSIS — C111 Malignant neoplasm of posterior wall of nasopharynx: Secondary | ICD-10-CM

## 2015-02-14 DIAGNOSIS — I2510 Atherosclerotic heart disease of native coronary artery without angina pectoris: Secondary | ICD-10-CM | POA: Insufficient documentation

## 2015-02-14 DIAGNOSIS — Z0189 Encounter for other specified special examinations: Secondary | ICD-10-CM | POA: Insufficient documentation

## 2015-02-14 DIAGNOSIS — R918 Other nonspecific abnormal finding of lung field: Secondary | ICD-10-CM

## 2015-02-14 DIAGNOSIS — C110 Malignant neoplasm of superior wall of nasopharynx: Secondary | ICD-10-CM | POA: Insufficient documentation

## 2015-02-14 LAB — GLUCOSE, CAPILLARY: Glucose-Capillary: 122 mg/dL — ABNORMAL HIGH (ref 65–99)

## 2015-02-14 MED ORDER — FLUDEOXYGLUCOSE F - 18 (FDG) INJECTION
5.6000 | Freq: Once | INTRAVENOUS | Status: AC | PRN
  Administered 2015-02-14: 5.6 via INTRAVENOUS

## 2015-02-15 ENCOUNTER — Telehealth: Payer: Self-pay | Admitting: *Deleted

## 2015-02-15 NOTE — Progress Notes (Addendum)
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 female who presented with Mental status changes, headaches.  MRI without contrast of breast showed a large nasopharyngeal mass, invading skull base and involving cranial nerves.  Subsequently, the patient saw Dr. Lucia Gaskins of ENT.  Biopsy of nasopharynx revealed: squamous cell carcinoma, invasive  Pertinent imaging thus far includes MRI as above and PET performed on 02-14-15 revealing hypermetabolic NP mass and a single hypermetabolic right neck node, 48mm.  Possible lung infection, no obvious metastases distantly  Swallowing issues, if any: yes - and throat pain. Trouble with pill  Weight Changes: 8lb loss  Pain status: throat pain, also right headaches radiating down right neck, to left neck  Other symptoms: visual issues since cataracts diagnosed years ago.  Needing changes in glasses Rx's. Partial left ptosis <1 week.  Tobacco history, if any: smokes 1/2ppd, smoked for decards  ETOH abuse, if any: distant heave use  Prior cancers, if any: none  She sees med/onc today  PREVIOUS RADIATION THERAPY: No  PAST MEDICAL HISTORY:  has a past medical history of THYROID NODULE, RIGHT (02/01/2009); GLUCOSE INTOLERANCE (11/04/2007); HYPERLIPIDEMIA (02/12/2007); HYPOKALEMIA (11/04/2007); ANXIETY (02/12/2007); HYPERTENSION (11/07/2006); CORONARY ARTERY DISEASE (11/04/2007); CAROTID ARTERY STENOSIS, BILATERAL (02/24/2007); Unspecified Peripheral Vascular Disease (02/12/2007); ASTHMATIC BRONCHITIS, ACUTE (04/22/2008); ALLERGIC RHINITIS (02/12/2007); COPD (11/04/2007); GERD  (02/12/2007); CONSTIPATION (02/12/2007); LOW BACK PAIN, CHRONIC (02/01/2009); BACK PAIN, RIGHT (12/05/2009); LEG PAIN, BILATERAL (02/01/2009); OSTEOPOROSIS (02/12/2007); DIZZINESS (02/12/2007); INSOMNIA-SLEEP DISORDER-UNSPEC (08/04/2007); FATIGUE (02/12/2007); CHEST PAIN (02/12/2007); Coronary artery disease (9/09); Peripheral vascular disease ([place]) (9/09); Impaired glucose tolerance (05/11/2010); Leg pain, bilateral (05/12/2010); Syncope and collapse; Essential hypertension, benign (06/17/2012); Shortness of breath dyspnea; and DEGENERATIVE JOINT DISEASE, FINGERS (03/04/2008).    PAST SURGICAL HISTORY: Past Surgical History  Procedure Laterality Date  . S/p bowel obstruction    . Abdominal hysterectomy    . Appendectomy    . Oophorectomy    . Tonsillectomy    . S/p right cea  2/09  . S/p right thyroid nodule biopsy  March 2011    negative  . Carotid endarterectomy    . Cardiac catheterization    . Direct laryngoscopy N/A 02/04/2015    Procedure: DIRECT LARYNGOSCOPY WITH BIOPSY OF NASOPHARYNGEAL MASS;  Surgeon: Rozetta Nunnery, MD;  Location: [facility];  Service: ENT;  Laterality: N/A;    FAMILY HISTORY: family history includes Alcohol abuse in her other; Cancer in her son; Diabetes in her other; Heart attack in her other; Hypertension in her other; Stroke in her other.  SOCIAL HISTORY:  reports that she has been smoking.  She has never used smokeless tobacco. She reports that she drinks alcohol. She reports that she does not use illicit drugs.  ALLERGIES: Tramadol  MEDICATIONS:  Current Outpatient Prescriptions  Medication Sig Dispense Refill  . amLODipine (NORVASC) 10 MG tablet Take 1 tablet (10 mg total) by mouth daily. 90 tablet 3  . atenolol (TENORMIN) 50 MG tablet Take 1 tablet (50 mg total) by mouth daily. 90 tablet 3  . atorvastatin (LIPITOR) 20 MG tablet Take  1 tablet (20 mg total) by mouth daily at 6 PM. ---patient needs office visit before any further refills 90 tablet 0  .  HYDROcodone-acetaminophen (NORCO/VICODIN) 5-325 MG tablet Take 1 tablet by mouth every 6 (six) hours as needed for moderate pain. 30 tablet 0  . aspirin 81 MG tablet Take 81 mg by mouth daily. Reported on 02/16/2015     No current facility-administered medications for this encounter.    REVIEW OF SYSTEMS:  Notable for that above.   PHYSICAL EXAM:  height is 5\' 3"  (1.6 m) and weight is 90 lb 8 oz (41.051 kg). Her temperature is 97.7 F (36.5 C). Her blood pressure is 152/72 and her pulse is 92. Her oxygen saturation is 96%.   General: Alert and conversant, thin, in no acute distress, occasionally twitches, in wheelchair HEENT: Head is normocephalic. Extraocular movements are intact. Vision grossly intact  Oropharynx is notable for poor dentition. No obvious mucosal lesions. Left partial ptosis. Tongue deviates slightly to right.  Slight trismus. Neck: Neck is notable for palpable node, 1cm, anterior level II right neck Heart: Regular in rate and rhythm with audible murmur (systolic) throughout precordium Chest: Clear to auscultation bilaterally, with no rhonchi, wheezes, or rales. Abdomen: Soft, nontender, nondistended, with no rigidity or guarding. Extremities: No cyanosis or edema. Lymphatics: see Neck Exam Skin: No concerning lesions. Musculoskeletal: 4+/5 symmetric strength throughout. Neurologic: see HEENT. Speech is fluent.   Psych: affect appropriate. Judgment and insight intact.  ECOG = 3  0 - Asymptomatic (Fully active, able to carry on all predisease activities without restriction)  1 - Symptomatic but completely ambulatory (Restricted in physically strenuous activity but ambulatory and able to carry out work of a light or sedentary nature. For example, light housework, office work)  2 - Symptomatic, <50% in bed during the day (Ambulatory and capable of all self care but unable to carry out any work activities. Up and about more than 50% of waking hours)  3 - Symptomatic, >50%  in bed, but not bedbound (Capable of only limited self-care, confined to bed or chair 50% or more of waking hours)  4 - Bedbound (Completely disabled. Cannot carry on any self-care. Totally confined to bed or chair)  5 - Death   Eustace Pen MM, Creech RH, Tormey DC, et al. [phone]). "Toxicity and response criteria of the [facility]". [place] Oncol. 5 (6): 649-55   LABORATORY DATA:  Lab Results  Component Value Date   WBC 11.6* 10/07/2013   HGB 16.2* 10/07/2013   HCT 46.9* 10/07/2013   MCV 87.0 10/07/2013   PLT 217 10/07/2013   CMP     Component Value Date/Time   NA 141 02/03/2015 1545   K 3.1* 02/03/2015 1545   CL 97* 02/03/2015 1545   CO2 32 02/03/2015 1545   GLUCOSE 149* 02/03/2015 1545   BUN 13 02/03/2015 1545   CREATININE 0.90 02/03/2015 1545   CALCIUM 10.0 02/03/2015 1545   PROT 8.5* 10/07/2013 1035   ALBUMIN 4.2 10/07/2013 1035   AST 17 10/07/2013 1035   ALT 8 10/07/2013 1035   ALKPHOS 63 10/07/2013 1035   BILITOT 0.4 10/07/2013 1035   GFRNONAA >60 02/03/2015 1545   GFRAA >60 02/03/2015 1545      Lab Results  Component Value Date   TSH 0.20* 07/09/2012      RADIOGRAPHY: Mr Brain Wo Contrast  01/27/2015  ADDENDUM REPORT: 01/27/2015 17:18 ADDENDUM: Study discussed by telephone with Dr. Cathlean Cower on 01/27/2015 at  1714 hours. He advised there is no known malignancy. I recommended ENT consultation, as they are probably best suited to biopsy the lesion. Electronically Signed   By: Genevie Ann M.D.   On: 01/27/2015 17:18  01/27/2015  CLINICAL DATA:  75 year old female with headaches. Worsening memory loss and confusion. No known injury. Initial encounter. EXAM: MRI HEAD WITHOUT CONTRAST TECHNIQUE: Multiplanar, multiecho pulse sequences of the brain and surrounding structures were obtained without intravenous contrast. COMPARISON:  Head CT without contrast 09/15/2013. Brain MRI 06/04/2011. FINDINGS: New since 2013, and also not evident on the 2015  comparison, is a destructive soft tissue mass at the central skullbase which has completely replaced the clivus and infiltrates bilaterally anteriorly and inferiorly. This T1 hypo intense mass tracks into the occipital condyles, more so the right. The bilateral hypoglossal canals are are obliterated. Tumor infiltrates into the nasopharynx, prevertebral and retropharyngeal spaces, and into the superior pterygoids. The floor of the sella is affected but the cavernous sinuses appear spared at this time. However, left Meckel cave is completely infiltrated as is the proximal course of the left V3 trunk. Some of the C1 vertebra is affected anteriorly on the right. Overall the mass encompasses 60 x 60 x 47 mm (AP by transverse by CC). The mass surrounds the left ICA siphon vertical and horizontal petrous segments and significantly narrows the vessel (series 16, image 18). Small volume of superimposed fluid in the sphenoid sinuses which are partially infiltrated. Surprisingly, no middle ear or mastoid fluid. The superior parapharyngeal spaces are relatively spared. Despite the above Major intracranial vascular flow voids are preserved. No restricted diffusion or evidence of acute infarction. No cerebral edema identified. No ventriculomegaly or acute intracranial hemorrhage. Chronic cerebral white matter signal changes and generalized cerebral volume loss have mildly progressed. Bone marrow signal throughout the calvarium a an below the odontoid process remains normal. Suspicious right level 2 lymph node enlargement (series 9, image 45). Negative scalp and orbits soft tissues. IMPRESSION: 1. Large, destructive, and infiltrative skullbase mass new since 2013 and also not evident on a 2015 head CT. Top differential considerations include Nasopharyngeal Carcinoma, Lymphoma, and Metastasis. 2. Enlarged right level 2 cervical lymph node suspicious for nodal metastasis. 3. Bilateral cranial nerve involvement and narrowing of the  left ICA petrous segment which is engulfed. However, no cerebral edema, acute infarct, or definite brain metastasis on this noncontrast exam. Electronically Signed: By: Genevie Ann M.D. On: 01/27/2015 17:14   Nm Pet Image Initial (pi) Skull Base To Thigh  02/14/2015  CLINICAL DATA:  Initial treatment strategy for malignant neoplasm of posterior nasopharyngeal wall. EXAM: NUCLEAR MEDICINE PET SKULL BASE TO THIGH TECHNIQUE: 5.6 mCi F-18 FDG was injected intravenously. Full-ring PET imaging was performed from the skull base to thigh after the radiotracer. CT data was obtained and used for attenuation correction and anatomic localization. FASTING BLOOD GLUCOSE:  Value: 122 mg/dl COMPARISON:  None. FINDINGS: NECK The borders of a hypermetabolic nasopharyngeal mass are somewhat difficult to definitively determine without IV contrast. Mass measures approximately 5.7 x 6.7 cm, erodes the skull base and narrows the airway to a diameter of 10 mm. Corresponding SUV max is 20.5. Right level 2 lymph node measures approximately 6 mm in short axis (CT image 16) with an SUV max of 7.8. No additional hypermetabolic lymph nodes in the neck. CT images show no acute findings. CHEST No hypermetabolic mediastinal, hilar or axillary lymph nodes. There is mildly hypermetabolic peribronchovascular ground-glass nodularity in the lingula and left lower  lobe. No hypermetabolic pulmonary nodules. CT images show three-vessel coronary artery calcification. No pericardial or pleural effusion. Mild centrilobular emphysema. ABDOMEN/PELVIS No abnormal hypermetabolism in the liver, adrenal glands, spleen or pancreas. No hypermetabolic lymph nodes. CT images show the liver, gallbladder, adrenal glands, kidneys, spleen, pancreas, stomach and bowel to be grossly unremarkable. Stool is seen throughout the colon. SKELETON No abnormal osseous hypermetabolism. IMPRESSION: 1. Large hypermetabolic nasopharyngeal mass and hypermetabolic right level 2 lymph node.  Associated skull base erosion and airway narrowing. No evidence of distant metastatic disease. 2. Mildly hypermetabolic peribronchovascular ground-glass nodularity in the lingula and left lower lobe, most likely due to bronchopneumonia. 3. Three-vessel coronary artery calcification. Electronically Signed   By: Lorin Picket M.D.   On: 02/14/2015 16:33      IMPRESSION/PLAN:  This is a delightful patient with head and neck cancer. I do recommend 7 weeks of radiotherapy with curative intent for this patient. She understands she has a serious, aggressive disease  We discussed the potential risks, benefits, and side effects of radiotherapy. We talked in detail about acute and late effects. We discussed that some of the most bothersome acute effects may be mucositis, dysgeusia, salivary changes, skin irritation, hair loss, dehydration, weight loss and fatigue. We talked about late effects which include but are not necessarily limited to dysphagia, hypothyroidism, nerve injury, spinal cord injury, xerostomia, trismus, and neck edema. No guarantees of treatment were given. A consent form was signed and placed in the patient's medical record. The patient and her granddaughter wish to think some more before making a final decision to proceed.  Simulation (treatment planning) will take place once cleared by dentistry and patient confirms interest to proceed.  We also discussed that the treatment of head and neck cancer is a multidisciplinary process to maximize treatment outcomes and quality of life. For this reasons the following referrals have been or will be made:   Medical oncology to discuss chemotherapy    Dentistry for dental evaluation, possible extractions in the radiation fields, and /or advice on reducing risk of cavities, osteoradionecrosis, or other oral issues.  I am willing to mold RT agressively away from tooth roots to expedite treatment for her aggressive tumor and avoid the delay of  extractions.   Nutritionist for nutrition support during and after treatment.   Speech language pathology for swallowing and/or speech therapy.   Social work for social support.    Physical therapy due to risk of lymphedema in neck and deconditioning.   Baseline labs including TSH.  MRI Brain with contrast for tumor delineation.  Patient will see Dr Alvy Bimler of medical oncology today.  Gayleen Orem, RN, our Head and Neck Oncology Navigator will let her know I defer to her on ABX for lung infection on imaging and narcotic management (which I will later assume if chemotherapy isn't recommended).  Patient/family alerted that she has a heart murmur.  I do not think this is an urgent issue, but advised them to discuss with her PCP at her next appointment. __________________________________________   Eppie Gibson, MD

## 2015-02-15 NOTE — Telephone Encounter (Signed)
  Oncology Nurse Navigator Documentation Navigator Location: [facility] (02/15/15 1213) Navigator Encounter Type: Telephone (02/15/15 1213) Telephone: Incoming Call;Appt Confirmation/Clarification (02/15/15 1213)       Ms [name] [name] called to confirm tomorrow's appts and to inform that her sister will be bringing their grandmother.  I explained arrival and registration process for RadOnc, she verbalized understanding.  Gayleen Orem, RN, BSN, [place] [facility] [place] [phone]                                     Time Spent with Patient: 15 (02/15/15 1213)

## 2015-02-16 ENCOUNTER — Ambulatory Visit (HOSPITAL_BASED_OUTPATIENT_CLINIC_OR_DEPARTMENT_OTHER): Payer: Medicare Other | Admitting: Hematology and Oncology

## 2015-02-16 ENCOUNTER — Encounter: Payer: Self-pay | Admitting: Radiation Therapy

## 2015-02-16 ENCOUNTER — Encounter: Payer: Self-pay | Admitting: Hematology and Oncology

## 2015-02-16 ENCOUNTER — Ambulatory Visit
Admission: RE | Admit: 2015-02-16 | Discharge: 2015-02-16 | Disposition: A | Discharge status: Home / Self Care | Payer: Medicare Other | Source: Ambulatory Visit | Attending: Radiation Oncology | Admitting: Radiation Oncology

## 2015-02-16 ENCOUNTER — Encounter: Payer: Self-pay | Admitting: Internal Medicine

## 2015-02-16 ENCOUNTER — Inpatient Hospital Stay (HOSPITAL_COMMUNITY): Payer: Medicare Other

## 2015-02-16 ENCOUNTER — Encounter (HOSPITAL_COMMUNITY): Payer: Self-pay

## 2015-02-16 ENCOUNTER — Ambulatory Visit (HOSPITAL_BASED_OUTPATIENT_CLINIC_OR_DEPARTMENT_OTHER): Payer: Medicare Other

## 2015-02-16 ENCOUNTER — Inpatient Hospital Stay (HOSPITAL_COMMUNITY)
Admission: AD | Admit: 2015-02-16 | Discharge: 2015-02-20 | DRG: 177 | Disposition: A | Discharge status: Hospice | Payer: Medicare Other | Source: Ambulatory Visit | Attending: Internal Medicine | Admitting: Internal Medicine

## 2015-02-16 ENCOUNTER — Encounter: Payer: Self-pay | Admitting: *Deleted

## 2015-02-16 ENCOUNTER — Other Ambulatory Visit: Payer: Self-pay | Admitting: Radiation Therapy

## 2015-02-16 ENCOUNTER — Encounter: Payer: Self-pay | Admitting: Radiation Oncology

## 2015-02-16 VITALS — BP 152/72 | HR 92 | Temp 97.7°F | Resp 17 | Wt 90.5 lb

## 2015-02-16 VITALS — BP 152/72 | HR 92 | Temp 97.7°F | Ht 63.0 in | Wt 90.5 lb

## 2015-02-16 DIAGNOSIS — J690 Pneumonitis due to inhalation of food and vomit: Principal | ICD-10-CM | POA: Diagnosis present

## 2015-02-16 DIAGNOSIS — M2624 Reverse articulation: Secondary | ICD-10-CM | POA: Diagnosis present

## 2015-02-16 DIAGNOSIS — I2510 Atherosclerotic heart disease of native coronary artery without angina pectoris: Secondary | ICD-10-CM | POA: Insufficient documentation

## 2015-02-16 DIAGNOSIS — K219 Gastro-esophageal reflux disease without esophagitis: Secondary | ICD-10-CM | POA: Diagnosis present

## 2015-02-16 DIAGNOSIS — M545 Low back pain: Secondary | ICD-10-CM | POA: Diagnosis present

## 2015-02-16 DIAGNOSIS — Z885 Allergy status to narcotic agent status: Secondary | ICD-10-CM

## 2015-02-16 DIAGNOSIS — R1310 Dysphagia, unspecified: Secondary | ICD-10-CM | POA: Diagnosis present

## 2015-02-16 DIAGNOSIS — M264 Malocclusion, unspecified: Secondary | ICD-10-CM | POA: Diagnosis present

## 2015-02-16 DIAGNOSIS — Z8249 Family history of ischemic heart disease and other diseases of the circulatory system: Secondary | ICD-10-CM

## 2015-02-16 DIAGNOSIS — C111 Malignant neoplasm of posterior wall of nasopharynx: Secondary | ICD-10-CM

## 2015-02-16 DIAGNOSIS — M1990 Unspecified osteoarthritis, unspecified site: Secondary | ICD-10-CM | POA: Diagnosis present

## 2015-02-16 DIAGNOSIS — E860 Dehydration: Secondary | ICD-10-CM | POA: Diagnosis present

## 2015-02-16 DIAGNOSIS — R627 Adult failure to thrive: Secondary | ICD-10-CM | POA: Diagnosis present

## 2015-02-16 DIAGNOSIS — G893 Neoplasm related pain (acute) (chronic): Secondary | ICD-10-CM | POA: Diagnosis present

## 2015-02-16 DIAGNOSIS — Z9189 Other specified personal risk factors, not elsewhere classified: Secondary | ICD-10-CM

## 2015-02-16 DIAGNOSIS — Z66 Do not resuscitate: Secondary | ICD-10-CM | POA: Diagnosis present

## 2015-02-16 DIAGNOSIS — I739 Peripheral vascular disease, unspecified: Secondary | ICD-10-CM | POA: Diagnosis present

## 2015-02-16 DIAGNOSIS — F419 Anxiety disorder, unspecified: Secondary | ICD-10-CM | POA: Diagnosis present

## 2015-02-16 DIAGNOSIS — Z7982 Long term (current) use of aspirin: Secondary | ICD-10-CM

## 2015-02-16 DIAGNOSIS — E873 Alkalosis: Secondary | ICD-10-CM | POA: Diagnosis present

## 2015-02-16 DIAGNOSIS — H269 Unspecified cataract: Secondary | ICD-10-CM | POA: Insufficient documentation

## 2015-02-16 DIAGNOSIS — C119 Malignant neoplasm of nasopharynx, unspecified: Secondary | ICD-10-CM | POA: Diagnosis not present

## 2015-02-16 DIAGNOSIS — J45909 Unspecified asthma, uncomplicated: Secondary | ICD-10-CM | POA: Diagnosis present

## 2015-02-16 DIAGNOSIS — I10 Essential (primary) hypertension: Secondary | ICD-10-CM | POA: Insufficient documentation

## 2015-02-16 DIAGNOSIS — M810 Age-related osteoporosis without current pathological fracture: Secondary | ICD-10-CM | POA: Diagnosis present

## 2015-02-16 DIAGNOSIS — Z85818 Personal history of malignant neoplasm of other sites of lip, oral cavity, and pharynx: Secondary | ICD-10-CM

## 2015-02-16 DIAGNOSIS — E785 Hyperlipidemia, unspecified: Secondary | ICD-10-CM | POA: Diagnosis present

## 2015-02-16 DIAGNOSIS — I6523 Occlusion and stenosis of bilateral carotid arteries: Secondary | ICD-10-CM | POA: Insufficient documentation

## 2015-02-16 DIAGNOSIS — E43 Unspecified severe protein-calorie malnutrition: Secondary | ICD-10-CM

## 2015-02-16 DIAGNOSIS — Z681 Body mass index (BMI) 19 or less, adult: Secondary | ICD-10-CM | POA: Diagnosis not present

## 2015-02-16 DIAGNOSIS — K5900 Constipation, unspecified: Secondary | ICD-10-CM | POA: Diagnosis present

## 2015-02-16 DIAGNOSIS — Z515 Encounter for palliative care: Secondary | ICD-10-CM

## 2015-02-16 DIAGNOSIS — R634 Abnormal weight loss: Secondary | ICD-10-CM

## 2015-02-16 DIAGNOSIS — E876 Hypokalemia: Secondary | ICD-10-CM | POA: Diagnosis present

## 2015-02-16 DIAGNOSIS — R413 Other amnesia: Secondary | ICD-10-CM

## 2015-02-16 DIAGNOSIS — Z833 Family history of diabetes mellitus: Secondary | ICD-10-CM

## 2015-02-16 DIAGNOSIS — F17200 Nicotine dependence, unspecified, uncomplicated: Secondary | ICD-10-CM | POA: Insufficient documentation

## 2015-02-16 DIAGNOSIS — R110 Nausea: Secondary | ICD-10-CM | POA: Diagnosis present

## 2015-02-16 DIAGNOSIS — R7302 Impaired glucose tolerance (oral): Secondary | ICD-10-CM | POA: Insufficient documentation

## 2015-02-16 DIAGNOSIS — C140 Malignant neoplasm of pharynx, unspecified: Secondary | ICD-10-CM

## 2015-02-16 DIAGNOSIS — Z823 Family history of stroke: Secondary | ICD-10-CM | POA: Diagnosis not present

## 2015-02-16 DIAGNOSIS — F17210 Nicotine dependence, cigarettes, uncomplicated: Secondary | ICD-10-CM | POA: Diagnosis present

## 2015-02-16 DIAGNOSIS — K0530 Chronic periodontitis, unspecified: Secondary | ICD-10-CM | POA: Diagnosis present

## 2015-02-16 DIAGNOSIS — Z789 Other specified health status: Secondary | ICD-10-CM

## 2015-02-16 DIAGNOSIS — K029 Dental caries, unspecified: Secondary | ICD-10-CM | POA: Diagnosis present

## 2015-02-16 DIAGNOSIS — J440 Chronic obstructive pulmonary disease with acute lower respiratory infection: Secondary | ICD-10-CM | POA: Diagnosis present

## 2015-02-16 DIAGNOSIS — Z79899 Other long term (current) drug therapy: Secondary | ICD-10-CM | POA: Diagnosis not present

## 2015-02-16 DIAGNOSIS — F1010 Alcohol abuse, uncomplicated: Secondary | ICD-10-CM | POA: Insufficient documentation

## 2015-02-16 DIAGNOSIS — E7439 Other disorders of intestinal carbohydrate absorption: Secondary | ICD-10-CM | POA: Diagnosis present

## 2015-02-16 DIAGNOSIS — D72829 Elevated white blood cell count, unspecified: Secondary | ICD-10-CM | POA: Diagnosis present

## 2015-02-16 DIAGNOSIS — Z01818 Encounter for other preprocedural examination: Secondary | ICD-10-CM | POA: Diagnosis not present

## 2015-02-16 DIAGNOSIS — G8929 Other chronic pain: Secondary | ICD-10-CM | POA: Insufficient documentation

## 2015-02-16 DIAGNOSIS — E041 Nontoxic single thyroid nodule: Secondary | ICD-10-CM | POA: Insufficient documentation

## 2015-02-16 LAB — CBC WITH DIFFERENTIAL/PLATELET
BASO%: 0.2 % (ref 0.0–2.0)
BASOS ABS: 0 10*3/uL (ref 0.0–0.1)
Basophils Absolute: 0 10*3/uL (ref 0.0–0.1)
Basophils Relative: 0 %
EOS PCT: 0 %
EOS%: 0 % (ref 0.0–7.0)
Eosinophils Absolute: 0 10*3/uL (ref 0.0–0.5)
Eosinophils Absolute: 0 10*3/uL (ref 0.0–0.7)
HCT: 42.1 % (ref 36.0–46.0)
HEMATOCRIT: 43.2 % (ref 34.8–46.6)
HEMOGLOBIN: 14 g/dL (ref 11.6–15.9)
Hemoglobin: 13.3 g/dL (ref 12.0–15.0)
LYMPH#: 0.4 10*3/uL — AB (ref 0.9–3.3)
LYMPH%: 3.2 % — ABNORMAL LOW (ref 14.0–49.7)
LYMPHS PCT: 6 %
Lymphs Abs: 0.8 10*3/uL (ref 0.7–4.0)
MCH: 27.2 pg (ref 25.1–34.0)
MCH: 27.9 pg (ref 26.0–34.0)
MCHC: 32.4 g/dL (ref 31.5–36.0)
MCHC: 31.6 g/dL (ref 30.0–36.0)
MCV: 84 fL (ref 79.5–101.0)
MCV: 88.4 fL (ref 78.0–100.0)
MONO ABS: 1.5 10*3/uL — AB (ref 0.1–1.0)
MONO#: 1.1 10*3/uL — ABNORMAL HIGH (ref 0.1–0.9)
MONO%: 8.2 % (ref 0.0–14.0)
MONOS PCT: 12 %
NEUT#: 12.2 10*3/uL — ABNORMAL HIGH (ref 1.5–6.5)
NEUT%: 88.4 % — AB (ref 38.4–76.8)
Neutro Abs: 10.2 10*3/uL — ABNORMAL HIGH (ref 1.7–7.7)
Neutrophils Relative %: 82 %
PLATELETS: 239 10*3/uL (ref 150–400)
Platelets: 248 10*3/uL (ref 145–400)
RBC: 5.14 10*6/uL (ref 3.70–5.45)
RBC: 4.76 MIL/uL (ref 3.87–5.11)
RDW: 15.2 % — AB (ref 11.2–14.5)
RDW: 14.9 % (ref 11.5–15.5)
WBC: 13.8 10*3/uL — ABNORMAL HIGH (ref 3.9–10.3)
WBC: 12.5 10*3/uL — ABNORMAL HIGH (ref 4.0–10.5)

## 2015-02-16 LAB — COMPREHENSIVE METABOLIC PANEL
ALBUMIN: 3.1 g/dL — AB (ref 3.5–5.0)
ALBUMIN: 3.4 g/dL — AB (ref 3.5–5.0)
ALK PHOS: 110 U/L (ref 40–150)
ALK PHOS: 92 U/L (ref 38–126)
ALT: 12 U/L (ref 0–55)
ALT: 13 U/L — ABNORMAL LOW (ref 14–54)
ANION GAP: 13 meq/L — AB (ref 3–11)
AST: 16 U/L (ref 5–34)
AST: 19 U/L (ref 15–41)
Anion gap: 12 (ref 5–15)
BILIRUBIN TOTAL: 0.66 mg/dL (ref 0.20–1.20)
BILIRUBIN TOTAL: 0.5 mg/dL (ref 0.3–1.2)
BUN: 18.7 mg/dL (ref 7.0–26.0)
BUN: 18 mg/dL (ref 6–20)
CALCIUM: 10.6 mg/dL — AB (ref 8.4–10.4)
CALCIUM: 10 mg/dL (ref 8.9–10.3)
CO2: 33 mEq/L — ABNORMAL HIGH (ref 22–29)
CO2: 38 mmol/L — AB (ref 22–32)
CREATININE: 0.9 mg/dL (ref 0.6–1.1)
Chloride: 92 mEq/L — ABNORMAL LOW (ref 98–109)
Chloride: 94 mmol/L — ABNORMAL LOW (ref 101–111)
Creatinine, Ser: 0.81 mg/dL (ref 0.44–1.00)
EGFR: 76 mL/min/{1.73_m2} — AB (ref >90)
GFR calc Af Amer: >60 mL/min (ref >60)
GFR calc non Af Amer: >60 mL/min (ref >60)
GLUCOSE: 128 mg/dL — AB (ref 65–99)
Glucose: 191 mg/dl — ABNORMAL HIGH (ref 70–140)
POTASSIUM: 3.5 mmol/L (ref 3.5–5.1)
Potassium: 2.7 mEq/L — CL (ref 3.5–5.1)
Sodium: 138 mEq/L (ref 136–145)
Sodium: 144 mmol/L (ref 135–145)
TOTAL PROTEIN: 8.3 g/dL (ref 6.4–8.3)
TOTAL PROTEIN: 7.9 g/dL (ref 6.5–8.1)

## 2015-02-16 LAB — MAGNESIUM
MAGNESIUM: 2.3 mg/dL (ref 1.5–2.5)
Magnesium: 1.8 mg/dL (ref 1.7–2.4)

## 2015-02-16 LAB — PROTIME-INR
INR: 1.1 (ref 0.00–1.49)
Prothrombin Time: 14.4 seconds (ref 11.6–15.2)

## 2015-02-16 LAB — PHOSPHORUS: Phosphorus: 3 mg/dL (ref 2.5–4.6)

## 2015-02-16 LAB — APTT: aPTT: 32 seconds (ref 24–37)

## 2015-02-16 MED ORDER — POTASSIUM CHLORIDE CRYS ER 20 MEQ PO TBCR: 40.0000 meq | EXTENDED_RELEASE_TABLET | Freq: Once | ORAL | Status: DC

## 2015-02-16 MED ORDER — HYDROMORPHONE HCL 4 MG/ML IJ SOLN
INTRAMUSCULAR | Status: AC
  Filled 2015-02-16: qty 1

## 2015-02-16 MED ORDER — POTASSIUM CHLORIDE 10 MEQ/100ML IV SOLN
10.0000 meq | INTRAVENOUS | Status: AC
  Administered 2015-02-16 (×2): 10 meq via INTRAVENOUS
  Filled 2015-02-16 (×3): qty 100

## 2015-02-16 MED ORDER — ACETAMINOPHEN 650 MG RE SUPP
650.0000 mg | Freq: Four times a day (QID) | RECTAL | Status: DC | PRN
  Filled 2015-02-16: qty 1

## 2015-02-16 MED ORDER — ATENOLOL 50 MG PO TABS
50.0000 mg | ORAL_TABLET | Freq: Every day | ORAL | Status: DC
  Administered 2015-02-16 – 2015-02-20 (×5): 50 mg via ORAL
  Filled 2015-02-16 (×5): qty 1

## 2015-02-16 MED ORDER — SODIUM CHLORIDE 0.9 % IV SOLN
Freq: Once | INTRAVENOUS | Status: AC
  Administered 2015-02-16: 12:00:00 via INTRAVENOUS

## 2015-02-16 MED ORDER — PIPERACILLIN-TAZOBACTAM 3.375 G IVPB
3.3750 g | Freq: Three times a day (TID) | INTRAVENOUS | Status: DC
  Administered 2015-02-16 – 2015-02-17 (×2): 3.375 g via INTRAVENOUS
  Filled 2015-02-16: qty 50

## 2015-02-16 MED ORDER — SODIUM CHLORIDE 0.9 % IV SOLN
INTRAVENOUS | Status: DC
  Administered 2015-02-16 – 2015-02-19 (×3): via INTRAVENOUS
  Administered 2015-02-20: 1000 mL via INTRAVENOUS

## 2015-02-16 MED ORDER — HYDROMORPHONE HCL 4 MG/ML IJ SOLN
1.0000 mg | INTRAMUSCULAR | Status: DC | PRN
  Administered 2015-02-16: 1 mg via INTRAVENOUS

## 2015-02-16 MED ORDER — AMLODIPINE BESYLATE 10 MG PO TABS
10.0000 mg | ORAL_TABLET | Freq: Every day | ORAL | Status: DC
  Administered 2015-02-16 – 2015-02-18 (×3): 10 mg via ORAL
  Filled 2015-02-16 (×3): qty 1

## 2015-02-16 MED ORDER — ONDANSETRON HCL 4 MG/2ML IJ SOLN: 4.0000 mg | Freq: Four times a day (QID) | INTRAMUSCULAR | Status: DC | PRN

## 2015-02-16 MED ORDER — ATORVASTATIN CALCIUM 10 MG PO TABS
20.0000 mg | ORAL_TABLET | Freq: Every day | ORAL | Status: DC
  Administered 2015-02-16 – 2015-02-17 (×2): 20 mg via ORAL
  Filled 2015-02-16 (×2): qty 2

## 2015-02-16 MED ORDER — GADOBENATE DIMEGLUMINE 529 MG/ML IV SOLN
10.0000 mL | Freq: Once | INTRAVENOUS | Status: AC | PRN
  Administered 2015-02-16: 8 mL via INTRAVENOUS

## 2015-02-16 MED ORDER — ONDANSETRON HCL 4 MG PO TABS: 4.0000 mg | ORAL_TABLET | Freq: Four times a day (QID) | ORAL | Status: DC | PRN

## 2015-02-16 MED ORDER — ASPIRIN EC 81 MG PO TBEC
81.0000 mg | DELAYED_RELEASE_TABLET | Freq: Every day | ORAL | Status: DC
  Administered 2015-02-16 – 2015-02-20 (×5): 81 mg via ORAL
  Filled 2015-02-16 (×5): qty 1

## 2015-02-16 MED ORDER — ACETAMINOPHEN 325 MG PO TABS
650.0000 mg | ORAL_TABLET | Freq: Four times a day (QID) | ORAL | Status: DC | PRN
  Administered 2015-02-17 – 2015-02-20 (×4): 650 mg via ORAL
  Filled 2015-02-16 (×4): qty 2

## 2015-02-16 MED ORDER — HYDROMORPHONE HCL 1 MG/ML IJ SOLN
0.5000 mg | INTRAMUSCULAR | Status: DC | PRN
Start: 2015-02-16 — End: 2015-02-17
  Administered 2015-02-16 – 2015-02-17 (×2): 0.5 mg via INTRAVENOUS
  Filled 2015-02-16 (×2): qty 1

## 2015-02-16 NOTE — Assessment & Plan Note (Signed)
She has baseline cognitive impairments. I spoke with her primary caregiver regarding CODE STATUS. Her medical power of attorney, Anne Fu wants her to get full CODE STATUS for now knowing that it may change in the future depending on her medical progress.

## 2015-02-16 NOTE — Addendum Note (Signed)
Encounter addended by: Eppie Gibson, MD on: 02/16/2015  2:54 PM<BR>     Documentation filed: Notes Section

## 2015-02-16 NOTE — Progress Notes (Signed)
[place] NOTE  Patient Care Team: Biagio Borg, MD as PCP - General Heath Lark, MD as Consulting Physician (Hematology and Oncology) Rozetta Nunnery, MD as Consulting Physician (Otolaryngology)  CHIEF COMPLAINTS/PURPOSE OF CONSULTATION:  Nasopharyngeal carcinoma  HISTORY OF PRESENTING ILLNESS:  [name] 75 y.o. female is here because of newly diagnosed nasopharyngeal cancer  According to the patient, the first initial presentation was due to worsening mental status change. She underwent multiple investigation including imaging study including MRI and PET CT scan and biopsy which confirmed locally advanced nasopharyngeal cancer. 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 2. 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 Associated skull base erosion and airway narrowing. No evidence of distant metastatic disease. 2. Evidence of pneumonia      MEDICAL HISTORY:  Past Medical History  Diagnosis Date  . THYROID NODULE, RIGHT 02/01/2009  . GLUCOSE INTOLERANCE 11/04/2007  . HYPERLIPIDEMIA 02/12/2007  . HYPOKALEMIA 11/04/2007  . ANXIETY 02/12/2007  . HYPERTENSION 11/07/2006  . CORONARY ARTERY DISEASE 11/04/2007  . CAROTID ARTERY STENOSIS, BILATERAL 02/24/2007  . Unspecified Peripheral Vascular Disease  02/12/2007  . ASTHMATIC BRONCHITIS, ACUTE 04/22/2008  . ALLERGIC RHINITIS 02/12/2007  . COPD 11/04/2007  . GERD 02/12/2007  . CONSTIPATION 02/12/2007  . LOW BACK PAIN, CHRONIC 02/01/2009  . BACK PAIN, RIGHT 12/05/2009  . LEG PAIN, BILATERAL 02/01/2009  . OSTEOPOROSIS 02/12/2007  . DIZZINESS 02/12/2007  . INSOMNIA-SLEEP DISORDER-UNSPEC 08/04/2007  . FATIGUE 02/12/2007  . CHEST PAIN 02/12/2007  . Coronary artery disease 9/09    nonobstructive by cath  . Peripheral vascular disease ([place]) 9/09    left carotid 60-80%   . Impaired glucose tolerance 05/11/2010  . Leg pain, bilateral 05/12/2010  . Syncope and collapse   . Essential hypertension, benign 06/17/2012  . Shortness of breath dyspnea     with exertion, sleeps on 2 pillows  . DEGENERATIVE JOINT DISEASE, FINGERS 03/04/2008    knees    SURGICAL HISTORY: Past Surgical History  Procedure Laterality Date  . S/p bowel obstruction    . Abdominal hysterectomy    . Appendectomy    . Oophorectomy    . Tonsillectomy    . S/p right cea  2/09  . S/p right thyroid nodule biopsy  March 2011    negative  . Carotid endarterectomy    . Cardiac catheterization    . Direct laryngoscopy N/A 02/04/2015    Procedure: DIRECT LARYNGOSCOPY WITH BIOPSY OF NASOPHARYNGEAL MASS;  Surgeon: Rozetta Nunnery, MD;  Location: [facility];  Service: ENT;  Laterality: N/A;    SOCIAL HISTORY: Social History   Social History  . Marital Status: Widowed    Spouse Name: N/A  . Number of Children: 2  . Years of Education: N/A   Occupational History  . 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 Smoking status:  Current Every Day Smoker -- 0.50 packs/day  . Smokeless tobacco: Never Used  . Alcohol Use: Yes     Comment: beer occasionally  . Drug Use: No  . Sexual Activity: Not on file   Other Topics Concern  . Not on file   Social History Narrative    FAMILY HISTORY: Family History  Problem Relation Age of  Onset  . Cancer Son     Possible colon cancer  . Heart attack Other   . Stroke Other   . Alcohol abuse Other   . Diabetes Other   . Hypertension Other     ALLERGIES:  is allergic to tramadol.  MEDICATIONS:  Current Outpatient Prescriptions  Medication Sig Dispense Refill  . amLODipine (NORVASC) 10 MG tablet Take 1 tablet (10 mg total) by mouth daily. 90 tablet 3  . aspirin 81 MG tablet Take 81 mg by mouth daily. Reported on 02/16/2015    . atenolol (TENORMIN) 50 MG tablet Take 1 tablet (50 mg total) by mouth daily. 90 tablet 3  . atorvastatin (LIPITOR) 20 MG tablet Take 1 tablet (20 mg total) by mouth daily at 6 PM. ---patient needs office visit before any further refills 90 tablet 0  . HYDROcodone-acetaminophen (NORCO/VICODIN) 5-325 MG tablet Take 1 tablet by mouth every 6 (six) hours as needed for moderate pain. 30 tablet 0   No current facility-administered medications for this visit.    REVIEW OF SYSTEMS:   Constitutional: Denies fevers, chills or abnormal night sweats Eyes: Denies blurriness of vision, double vision or watery eyes Cardiovascular: Denies palpitation, chest discomfort or lower extremity swelling Gastrointestinal:  Denies nausea, heartburn or change in bowel habits Skin: Denies abnormal skin rashes Lymphatics: Denies new lymphadenopathy or easy bruising Neurological:Denies numbness, tingling or new weaknesses Behavioral/Psych: Mood is stable, no new changes  All other systems were reviewed with the patient and are negative.  PHYSICAL EXAMINATION: ECOG PERFORMANCE STATUS: 3 - Symptomatic, >50% confined to bed  Filed Vitals:   02/16/15 1010  BP: 152/72  Pulse: 92  Temp: 97.7 F (36.5 C)  Resp: 17   Filed Weights   02/16/15 1010  Weight: 90 lb 8 oz (41.051 kg)    GENERAL:alert, no distress and comfortable. She looks weak, thin and cachectic SKIN: skin color, texture, turgor are normal, no rashes or significant lesions EYES: normal, conjunctiva are  pink and non-injected, sclera clear OROPHARYNX:no exudate, no erythema and lips, buccal mucosa, and tongue normal  NECK: supple, thyroid normal size, non-tender, without nodularity LYMPH:  no palpable lymphadenopathy in the cervical, axillary or inguinal LUNGS: clear to auscultation and percussion with normal breathing effort. She coughs after she swallows liquids HEART: regular rate & rhythm and no murmurs and no lower extremity edema ABDOMEN:abdomen soft, non-tender and normal bowel sounds Musculoskeletal:no cyanosis of digits and no clubbing  PSYCH: alert & oriented x 3 with fluent speech NEURO: no focal motor/sensory deficits  LABORATORY DATA:  I have reviewed the data as listed Lab Results  Component Value Date   WBC 11.6* 10/07/2013   HGB 16.2* 10/07/2013   HCT 46.9* 10/07/2013   MCV 87.0 10/07/2013   PLT 217 10/07/2013   Lab Results  Component Value Date   NA 141 02/03/2015   K 3.1* 02/03/2015   CL 97* 02/03/2015   CO2 32 02/03/2015    RADIOGRAPHIC STUDIES: I have personally reviewed the radiological images as listed and agreed with the findings in the report. Mr Brain Wo Contrast  01/27/2015  ADDENDUM  REPORT: 01/27/2015 17:18 ADDENDUM: Study discussed by telephone with Dr. Cathlean Cower on 01/27/2015 at 1714 hours. He advised there is no known malignancy. I recommended ENT consultation, as they are probably best suited to biopsy the lesion. Electronically Signed   By: Genevie Ann M.D.   On: 01/27/2015 17:18  01/27/2015  CLINICAL DATA:  75 year old female with headaches. Worsening memory loss and confusion. No known injury. Initial encounter. EXAM: MRI HEAD WITHOUT CONTRAST TECHNIQUE: Multiplanar, multiecho pulse sequences of the brain and surrounding structures were obtained without intravenous contrast. COMPARISON:  Head CT without contrast 09/15/2013. Brain MRI 06/04/2011. FINDINGS: New since 2013, and also not evident on the 2015 comparison, is a destructive soft tissue mass at  the central skullbase which has completely replaced the clivus and infiltrates bilaterally anteriorly and inferiorly. This T1 hypo intense mass tracks into the occipital condyles, more so the right. The bilateral hypoglossal canals are are obliterated. Tumor infiltrates into the nasopharynx, prevertebral and retropharyngeal spaces, and into the superior pterygoids. The floor of the sella is affected but the cavernous sinuses appear spared at this time. However, left Meckel cave is completely infiltrated as is the proximal course of the left V3 trunk. Some of the C1 vertebra is affected anteriorly on the right. Overall the mass encompasses 60 x 60 x 47 mm (AP by transverse by CC). The mass surrounds the left ICA siphon vertical and horizontal petrous segments and significantly narrows the vessel (series 16, image 18). Small volume of superimposed fluid in the sphenoid sinuses which are partially infiltrated. Surprisingly, no middle ear or mastoid fluid. The superior parapharyngeal spaces are relatively spared. Despite the above Major intracranial vascular flow voids are preserved. No restricted diffusion or evidence of acute infarction. No cerebral edema identified. No ventriculomegaly or acute intracranial hemorrhage. Chronic cerebral white matter signal changes and generalized cerebral volume loss have mildly progressed. Bone marrow signal throughout the calvarium a an below the odontoid process remains normal. Suspicious right level 2 lymph node enlargement (series 9, image 45). Negative scalp and orbits soft tissues. IMPRESSION: 1. Large, destructive, and infiltrative skullbase mass new since 2013 and also not evident on a 2015 head CT. Top differential considerations include Nasopharyngeal Carcinoma, Lymphoma, and Metastasis. 2. Enlarged right level 2 cervical lymph node suspicious for nodal metastasis. 3. Bilateral cranial nerve involvement and narrowing of the left ICA petrous segment which is engulfed.  However, no cerebral edema, acute infarct, or definite brain metastasis on this noncontrast exam. Electronically Signed: By: Genevie Ann M.D. On: 01/27/2015 17:14   Nm Pet Image Initial (pi) Skull Base To Thigh  02/14/2015  CLINICAL DATA:  Initial treatment strategy for malignant neoplasm of posterior nasopharyngeal wall. EXAM: NUCLEAR MEDICINE PET SKULL BASE TO THIGH TECHNIQUE: 5.6 mCi F-18 FDG was injected intravenously. Full-ring PET imaging was performed from the skull base to thigh after the radiotracer. CT data was obtained and used for attenuation correction and anatomic localization. FASTING BLOOD GLUCOSE:  Value: 122 mg/dl COMPARISON:  None. FINDINGS: NECK The borders of a hypermetabolic nasopharyngeal mass are somewhat difficult to definitively determine without IV contrast. Mass measures approximately 5.7 x 6.7 cm, erodes the skull base and narrows the airway to a diameter of 10 mm. Corresponding SUV max is 20.5. Right level 2 lymph node measures approximately 6 mm in short axis (CT image 16) with an SUV max of 7.8. No additional hypermetabolic lymph nodes in the neck. CT images show no acute findings. CHEST No hypermetabolic mediastinal, hilar or axillary  lymph nodes. There is mildly hypermetabolic peribronchovascular ground-glass nodularity in the lingula and left lower lobe. No hypermetabolic pulmonary nodules. CT images show three-vessel coronary artery calcification. No pericardial or pleural effusion. Mild centrilobular emphysema. ABDOMEN/PELVIS No abnormal hypermetabolism in the liver, adrenal glands, spleen or pancreas. No hypermetabolic lymph nodes. CT images show the liver, gallbladder, adrenal glands, kidneys, spleen, pancreas, stomach and bowel to be grossly unremarkable. Stool is seen throughout the colon. SKELETON No abnormal osseous hypermetabolism. IMPRESSION: 1. Large hypermetabolic nasopharyngeal mass and hypermetabolic right level 2 lymph node. Associated skull base erosion and airway  narrowing. No evidence of distant metastatic disease. 2. Mildly hypermetabolic peribronchovascular ground-glass nodularity in the lingula and left lower lobe, most likely due to bronchopneumonia. 3. Three-vessel coronary artery calcification. Electronically Signed   By: Lorin Picket M.D.   On: 02/14/2015 16:33    ASSESSMENT & PLAN . 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 I would not recommend concurrent chemotherapy therapy due to her poor performance status. I recommend palliative care only for now. If she improved further in the future, she may benefit from palliative radiation treatment. I have a long discussion with her caregiver, her granddaughter, Anne Fu and she understood the severity of her situation. I recommend admission to the hospital for IV antibiotics for pneumonia, IV pain medicine for uncontrolled pain, speech and language therapy for assessment of swallowing, nutrition consult for recent weight loss and palliative care consult for symptomatic management and consideration for possible hospice care.  Protein-calorie malnutrition, severe ([place]) She has lost a lot of weight, at least 11 pounds since a visit with her primary care doctor. She is not able to eat because of severe, uncontrolled pain. I'm also concerned about aspiration pneumonia. I recommend dietitian review and swallow assessment once she is hospitalized.   Aspiration pneumonia of left lung ([place]) She cough after swallowing liquids in my office. Her PET/CT scan from Monday showed signs of aspiration pneumonia. I recommend speech and language therapy assessment when she is hospitalized to see if she needs modified diet  Cancer associated pain She has severe uncontrolled pain. While waiting for a bed, I will give her 1 mg of IV Dilaudid in the office. I recommend palliative care  service to see her for symptom management while hospitalized  FTT (failure to thrive) in adult She has failure to thrive and appear clinically dehydrated. We will start her on some IV fluids while waiting for bed placement  Full code status She has baseline cognitive impairments. I spoke with her primary caregiver regarding CODE STATUS. Her medical power of attorney, Anne Fu wants her to get full CODE STATUS for now knowing that it may change in the future depending on her medical progress.  Palliative care by specialist I introduced the concept of palliative care and hospice to the patient and her caregiver. Given her poor performance status and failure to thrive, I am not sure that we could make her better to the point that she could benefit from further treatment for her cancer. I fully support palliative care consult while hospitalized and consideration for home hospice care soon after discharge.      All questions were answered. The patient knows to call the clinic with any problems, questions or concerns. I spent 55 minutes counseling the patient face to face. The total time spent in the appointment was 60 minutes and more than 50% was on  counseling.     [place], [place], MD 02/16/2015 11:23 AM

## 2015-02-16 NOTE — Evaluation (Addendum)
SLP Cancellation Note  Patient Details Name: [name] MRN: [id number] DOB: Oct 08, 1940   Cancelled treatment:       Reason Eval/Treat Not Completed: Other (comment) (pt newly admitted from cancer center this pm, SLP would recommend consider MBS to allow instrumental swallow evaluation given cranial nerve involvement with nasopharyngeal mass and coughing with intake, Note palliative referral pending = MBS may be helpful in decision making.  MD please order MBS if you agree.  SlP to follow up next date 02/17/15.  Thanks.    Luanna Salk, [place] [facility] SLP [phone]

## 2015-02-16 NOTE — Assessment & Plan Note (Signed)
She cough after swallowing liquids in my office. Her PET/CT scan from Monday showed signs of aspiration pneumonia. I recommend speech and language therapy assessment when she is hospitalized to see if she needs modified diet

## 2015-02-16 NOTE — Progress Notes (Signed)
ANTIBIOTIC CONSULT NOTE - INITIAL  Pharmacy Consult for Zosyn Indication: Aspiration Pneumonia  Allergies  Allergen Reactions  . Tramadol Other (See Comments)    constipation    Patient Measurements: Height: 5\' 3"  (160 cm) Weight: 90 lb 13.3 oz (41.2 kg) IBW/kg (Calculated) : 52.4  Vital Signs: Temp: 97.6 F (36.4 C) (01/18 1306) Temp Source: Oral (01/18 1306) BP: 157/63 mmHg (01/18 1306) Pulse Rate: 83 (01/18 1306) Intake/Output from previous day:    Labs:  Recent Labs  02/16/15 1133 02/16/15 1133  WBC  --  13.8*  HGB  --  14.0  PLT  --  248  CREATININE 0.9  --    Estimated Creatinine Clearance: 36.2 mL/min (by C-G formula based on Cr of 0.9).  Microbiology: No results found for this or any previous visit (from the past 720 hour(s)).  Medical History: History reviewed. No pertinent past medical history.  Medications:  Anti-infectives    None     Assessment: 63 yoF admitted on 1/18 directly from [facility] for possible aspiration pneumonia.  Pharmacy is consulted to dose Zosyn.  Today, 02/16/2015: Tmax: afebrile WBC 12.5 SCr 0.9 with CrCl ~ 36 ml/min  Antimicrobials this admission: 1/18 >> Zosyn >>  Levels/dose changes this admission:  Microbiology Results:   Goal of Therapy:  Appropriate abx dosing, eradication of infection.  Plan:   Zosyn 3.375g IV Q8H infused over 4hrs.  Follow up renal fxn, culture results, and clinical course.  Gretta Arab PharmD, BCPS Pager [phone] 02/16/2015 3:08 PM

## 2015-02-16 NOTE — Progress Notes (Signed)
1.  Do you need a wheel chair?   yes   2. On oxygen? no  3. Have you ever had any surgery in the body part being scanned? Yes, neck - due to a blood clot   4. Have you ever had any surgery on your brain or heart?                                            no  5. Have you ever had surgery on your eyes or ears?                                             no  6. Do you have a pacemaker or defibrillator?   no  7. Do you have a Neurostimulator?    no 8. Claustrophobic?  no  9. Any risk for metal in eyes?  no  10. Injury by bullet, buckshot, or shrapnel? no  11. Stent?  no                                                                                                                12. Hx of Cancer?   Yes nasopharyngeal cancer - this scan is to define its boarders                                                                                                    13. Kidney or Liver disease?  no  14. Hx of Lupus, Rheumatoid Arthritis or Scleroderma?  no  15. IV Antibiotics or long term use of NSAIDS?  Recent admission due to pneumonia - she did receive IV antibiotics   16. HX of Hypertension?  Yes, controlled by meds   17. Diabetes?  no  18. Allergy to contrast?  no  19. Recent labs. Recent labs in Epic from hospital stay   These questions were answered by her granddaughter over the phone.   Mont Dutton R.T.(R)(T)  Special Procedures Navigator  [phone]

## 2015-02-16 NOTE — Assessment & Plan Note (Signed)
She has failure to thrive and appear clinically dehydrated. We will start her on some IV fluids while waiting for bed placement

## 2015-02-16 NOTE — Progress Notes (Signed)
Notified Patient Placement of Bed request direct admit to 3W or first available Med/Surg Bed.   They will call back w/ room when clean on 3W.   Called Charge RN, Legrand Rams, and gave report on pt..  They still need to clean bed.     Started IVFs and Pain medication given to pt while she is waiting in exam room.  Pt and grand-daughter understand waiting for bed in hospital.

## 2015-02-16 NOTE — Progress Notes (Signed)
   02/16/15 1600  Clinical Encounter Type  Visited With Patient and family together  Visit Type Other (Comment) (advance directive)  Referral From Nurse  Advance Directives (For Healthcare)  Does patient have an advance directive? Yes  Would patient like information on creating an advanced directive? No - patient declined information  Type of Advance Directive Living will;Healthcare Power of Attorney  Does patient want to make changes to advanced directive? No - Patient declined  Copy of advanced directive(s) in chart? Yes    Chaplain provided education, assistance with advance directive.  Chaplain notarized  HCPOA and Living will.  Pt with original and two copies.  Copy placed in chart.   [place], [place]

## 2015-02-16 NOTE — Assessment & Plan Note (Signed)
I introduced the concept of palliative care and hospice to the patient and her caregiver. Given her poor performance status and failure to thrive, I am not sure that we could make her better to the point that she could benefit from further treatment for her cancer. I fully support palliative care consult while hospitalized and consideration for home hospice care soon after discharge.

## 2015-02-16 NOTE — Assessment & Plan Note (Signed)
She has severe uncontrolled pain. While waiting for a bed, I will give her 1 mg of IV Dilaudid in the office. I recommend palliative care service to see her for symptom management while hospitalized

## 2015-02-16 NOTE — Progress Notes (Signed)
  Oncology Nurse Navigator Documentation Navigator Location: [facility]-Med Onc (02/16/15 2224) Navigator Encounter Type: Initial MedOnc;Initial RadOnc (02/16/15 0905)           Patient Visit Type: MedOnc;RadOnc (02/16/15 0905) Treatment Phase: Pre-Tx/Tx Discussion (02/16/15 0905) Barriers/Navigation Needs: Family concerns (02/16/15 0905)                Acuity: Level 2 (02/16/15 0905)   Acuity Level 2: Ongoing guidance and education throughout treatment as needed;Educational needs (02/16/15 0905)     Met with [name] during initial consult with Dr. Isidore Moos.  She arrived in [facility], was accompanied by her granddaughter Anne Fu.   1. Further introduced myself as her Navigator, explained my role as a member of the Care Team.   2. Provided New Patient Information packet, discussed contents:  Contact information for physician(s), myself, other members of the Care Team.  Fall Prevention Patient Safety Plan  Appointment Guideline  Financial Assistance Information sheet  ACS Referral form  [facility] campus map with highlight of [place] 3. Provided introductory explanation of radiation treatment including SIM planning and purpose of Aquaplast head and shoulder mask, showed them example.   4. Leah Hendrie reported uncontrolled pain and weakness.  I escorted them to and joined them during initial consult with Dr. Alvy Bimler.   They verbalized understanding of Dr. Calton Dach:  Concerns about including chemotherapy as part of her treatment plan.  The need to manage pain and treat apparent aspiration PNA, agreed with Dr. Calton Dach recommendation for admission. Leah Ozer remained in exam room pending availability of inpatient room.  RN Cameo began IVF and administered pain medication.   When notified by RN Cameo of room readiness, I  took Leah Sillas to [facility] Registration and 1340, gave report to [employer].  I will continue to follow Leah Mower during this admission.  Gayleen Orem, RN, BSN,  [place] [facility] [place] [phone]

## 2015-02-16 NOTE — Assessment & Plan Note (Signed)
The patient has significant locally advanced disease with uncontrolled pain, progressive weight loss and failure to thrive. I would not recommend concurrent chemotherapy therapy due to her poor performance status. I recommend palliative care only for now. If she improved further in the future, she may benefit from palliative radiation treatment. I have a long discussion with her caregiver, her granddaughter, Anne Fu and she understood the severity of her situation. I recommend admission to the hospital for IV antibiotics for pneumonia, IV pain medicine for uncontrolled pain, speech and language therapy for assessment of swallowing, nutrition consult for recent weight loss and palliative care consult for symptomatic management and consideration for possible hospice care.

## 2015-02-16 NOTE — Assessment & Plan Note (Signed)
She has lost a lot of weight, at least 11 pounds since a visit with her primary care doctor. She is not able to eat because of severe, uncontrolled pain. I'm also concerned about aspiration pneumonia. I recommend dietitian review and swallow assessment once she is hospitalized.

## 2015-02-16 NOTE — H&P (Signed)
[facility] History and Physical  [name] [id number] DOB: 12-Mar-1940 DOA: 02/16/2015  Referring physician: Dr. Heath Lark of oncology PCP: Cathlean Cower, MD  Chief Complaint: "not feeling well"  HPI:  75 year old female with past medical history of nasopharyngeal carcinoma, under Dr. Calton Dach care, hypertension, dyslipidemia who presented to cancer center today not feeling very well and was noticed to cough up meds in the office. Patient does have difficulty swallowing since the diagnosis of cancer. No fevers or chills. No chest pain. No shortness of breath. No abdominal pain. She has some nausea but no vomiting. No lightheadedness or loss of consciousness.  She was directly admitted from cancer center for evaluation of possible aspiration. Her blood work showed potassium of 2.7, calcium 10.6, WBC count 13.8. She is hemodynamically stable at this time. Palliative care consulted for goals of care.  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[place]) - Will attempt sips of water with meds  - Order placed for SLP eval - Palliative consulted for goals of care  Active Problems:   Protein-calorie malnutrition, severe ([place]) /  FTT (failure to thrive) in adult - In the context of chronic illness - Needs SLP eval - Nutrition consulted    Cancer associated pain  - Continue pain management efforts    Hypokalemia - Due to poor po intake - Supplemented in IV form    Leukocytosis - Unclear etiology - Check CXR, UA and Urine culture    Hypercalcemia - Likely due to dehydration or malignancy - Continue IV fluids - Follow up BMP in am    Essential hypertension, benign - Continue norvasc and atenolol    Dyslipidemia - Continue statin therapy  DVT prophylaxis:  - SCD's bilaterally    Radiological Exams on Admission: Nm Pet Image Initial (pi) Skull Base To Thigh 02/14/2015 1. Large hypermetabolic  nasopharyngeal mass and hypermetabolic right level 2 lymph node. Associated skull base erosion and airway narrowing. No evidence of distant metastatic disease. 2. Mildly hypermetabolic peribronchovascular ground-glass nodularity in the lingula and left lower lobe, most likely due to bronchopneumonia. 3. Three-vessel coronary artery calcification. Electronically Signed   By: Lorin Picket M.D.   On: 02/14/2015 16:33    Code Status: Full Family Communication: Plan of care discussed with the patient  Disposition Plan: Admit for further evaluation  Leisa Lenz, MD  [facility] Pager [phone]  Time spent in minutes: 75 minutes  Review of Systems:  Constitutional: Negative for fever, chills and positive for malaise/fatigue. Negative for diaphoresis.  HENT: Negative for hearing loss, ear pain, nosebleeds, congestion, sore throat, neck pain, tinnitus and ear discharge.   Eyes: Negative for blurred vision, double vision, photophobia, pain, discharge and redness.  Respiratory: Negative for cough, hemoptysis, sputum production, shortness of breath, wheezing and stridor.   Cardiovascular: Negative for chest pain, palpitations, orthopnea, claudication and leg swelling.  Gastrointestinal: Negative for nausea, vomiting and abdominal pain. Negative for heartburn, constipation, blood in stool and melena.  Genitourinary: Negative for dysuria, urgency, frequency, hematuria and flank pain.  Musculoskeletal: Negative for myalgias, back pain, joint pain and falls.  Skin: Negative for itching and rash.  Neurological: Negative for dizziness and weakness. Negative for tingling, tremors, sensory change, speech change, focal weakness, loss of consciousness and headaches.  Endo/Heme/Allergies: Negative for environmental allergies and polydipsia. Does not bruise/bleed easily.  Psychiatric/Behavioral: Negative for suicidal ideas. The patient is not nervous/anxious.  Past Medical History  Diagnosis Date   . THYROID NODULE, RIGHT 02/01/2009  . GLUCOSE INTOLERANCE 11/04/2007  . HYPERLIPIDEMIA 02/12/2007  . HYPOKALEMIA 11/04/2007  . ANXIETY 02/12/2007  . HYPERTENSION 11/07/2006  . CORONARY ARTERY DISEASE 11/04/2007  . CAROTID ARTERY STENOSIS, BILATERAL 02/24/2007  . Unspecified Peripheral Vascular Disease 02/12/2007  . ASTHMATIC BRONCHITIS, ACUTE 04/22/2008  . ALLERGIC RHINITIS 02/12/2007  . COPD 11/04/2007  . GERD 02/12/2007  . CONSTIPATION 02/12/2007  . LOW BACK PAIN, CHRONIC 02/01/2009  . BACK PAIN, RIGHT 12/05/2009  . LEG PAIN, BILATERAL 02/01/2009  . OSTEOPOROSIS 02/12/2007  . DIZZINESS 02/12/2007  . INSOMNIA-SLEEP DISORDER-UNSPEC 08/04/2007  . FATIGUE 02/12/2007  . CHEST PAIN 02/12/2007  . Coronary artery disease 9/09    nonobstructive by cath  . Peripheral vascular disease ([place]) 9/09    left carotid 60-80%   . Impaired glucose tolerance 05/11/2010  . Leg pain, bilateral 05/12/2010  . Syncope and collapse   . Essential hypertension, benign 06/17/2012  . Shortness of breath dyspnea     with exertion, sleeps on 2 pillows  . DEGENERATIVE JOINT DISEASE, FINGERS 03/04/2008    knees   Past Surgical History  Procedure Laterality Date  . S/p bowel obstruction    . Abdominal hysterectomy    . Appendectomy    . Oophorectomy    . Tonsillectomy    . S/p right cea  2/09  . S/p right thyroid nodule biopsy  March 2011    negative  . Carotid endarterectomy    . Cardiac catheterization    . Direct laryngoscopy N/A 02/04/2015    Procedure: DIRECT LARYNGOSCOPY WITH BIOPSY OF NASOPHARYNGEAL MASS;  Surgeon: Rozetta Nunnery, MD;  Location: [place];  Service: ENT;  Laterality: N/A;   Social History:  reports that she has been smoking.  She has never used smokeless tobacco. She reports that she drinks alcohol. She reports that she does not use illicit drugs.  Allergies  Allergen Reactions  . Tramadol Other (See Comments)    constipation    Family History:  Family History  Problem  Relation Age of Onset  . Cancer Son     Possible colon cancer  . Heart attack Other   . Stroke Other   . Alcohol abuse Other   . Diabetes Other   . Hypertension Other      Prior to Admission medications   Medication Sig Start Date End Date Taking? Authorizing Provider  amLODipine (NORVASC) 10 MG tablet Take 1 tablet (10 mg total) by mouth daily. 01/06/15   Biagio Borg, MD  aspirin 81 MG tablet Take 81 mg by mouth daily. Reported on 02/16/2015    Historical Provider, MD  atenolol (TENORMIN) 50 MG tablet Take 1 tablet (50 mg total) by mouth daily. 01/06/15   Biagio Borg, MD  atorvastatin (LIPITOR) 20 MG tablet Take 1 tablet (20 mg total) by mouth daily at 6 PM. ---patient needs office visit before any further refills 12/16/14   Biagio Borg, MD  HYDROcodone-acetaminophen (NORCO/VICODIN) 5-325 MG tablet Take 1 tablet by mouth every 6 (six) hours as needed for moderate pain. 02/04/15   Rozetta Nunnery, MD   Physical Exam: Filed Vitals:   02/16/15 1306  BP: 157/63  Pulse: 83  Temp: 97.6 F (36.4 C)  TempSrc: Oral  Resp: 16  Height: 5\' 3"  (1.6 m)  Weight: 90 lb 13.3 oz (41.2 kg)  SpO2: 98%    Physical Exam  Constitutional: Appears well-developed and well-nourished.  No distress.  HENT: Normocephalic. No tonsillar erythema or exudates Eyes: Conjunctivae are normal. No scleral icterus.  Neck: Normal ROM. Neck supple. No JVD. No tracheal deviation. No thyromegaly.  CVS: RRR, S1/S2 appreciated  Pulmonary: Effort and breath sounds normal, no stridor, rhonchi, wheezes, rales.  Abdominal: Soft. BS +,  no distension, tenderness, rebound or guarding.  Musculoskeletal: Normal range of motion. No edema and no tenderness.  Lymphadenopathy: No lymphadenopathy noted, cervical, inguinal. Neuro: Alert. Normal reflexes, muscle tone coordination. No focal neurologic deficits. Skin: Skin is warm and dry. No rash noted.  No erythema. No pallor.  Psychiatric: Normal mood and affect. Behavior,  judgment, thought content normal.   Labs on Admission:  Basic Metabolic Panel:  Recent Labs Lab 02/16/15 1133  NA 138  K 2.7 Repeated and Verified*  CO2 33*  GLUCOSE 191*  BUN 18.7  CREATININE 0.9  CALCIUM 10.6*  MG 2.3   Liver Function Tests:  Recent Labs Lab 02/16/15 1133  AST 16  ALT 12  ALKPHOS 110  BILITOT 0.66  PROT 8.3  ALBUMIN 3.1*   No results for input(s): LIPASE, AMYLASE in the last 168 hours. No results for input(s): AMMONIA in the last 168 hours. CBC:  Recent Labs Lab 02/16/15 1133  WBC 13.8*  NEUTROABS 12.2*  HGB 14.0  HCT 43.2  MCV 84.0  PLT 248   Cardiac Enzymes: No results for input(s): CKTOTAL, CKMB, CKMBINDEX, TROPONINI in the last 168 hours. BNP: Invalid input(s): POCBNP CBG:  Recent Labs Lab 02/14/15 1421  GLUCAP 122*    If 7PM-7AM, please contact night-coverage [email or web address] Password [email or web address] 02/16/2015, 1:46 PM

## 2015-02-17 ENCOUNTER — Inpatient Hospital Stay (HOSPITAL_COMMUNITY): Payer: Medicare Other

## 2015-02-17 ENCOUNTER — Encounter (HOSPITAL_COMMUNITY): Payer: Self-pay | Admitting: Dentistry

## 2015-02-17 DIAGNOSIS — J690 Pneumonitis due to inhalation of food and vomit: Principal | ICD-10-CM

## 2015-02-17 DIAGNOSIS — C119 Malignant neoplasm of nasopharynx, unspecified: Secondary | ICD-10-CM

## 2015-02-17 DIAGNOSIS — G893 Neoplasm related pain (acute) (chronic): Secondary | ICD-10-CM

## 2015-02-17 DIAGNOSIS — K0530 Chronic periodontitis, unspecified: Secondary | ICD-10-CM | POA: Diagnosis present

## 2015-02-17 DIAGNOSIS — Z01818 Encounter for other preprocedural examination: Secondary | ICD-10-CM

## 2015-02-17 DIAGNOSIS — C111 Malignant neoplasm of posterior wall of nasopharynx: Secondary | ICD-10-CM

## 2015-02-17 DIAGNOSIS — R627 Adult failure to thrive: Secondary | ICD-10-CM

## 2015-02-17 DIAGNOSIS — E876 Hypokalemia: Secondary | ICD-10-CM

## 2015-02-17 DIAGNOSIS — Z515 Encounter for palliative care: Secondary | ICD-10-CM

## 2015-02-17 DIAGNOSIS — I10 Essential (primary) hypertension: Secondary | ICD-10-CM

## 2015-02-17 DIAGNOSIS — E43 Unspecified severe protein-calorie malnutrition: Secondary | ICD-10-CM

## 2015-02-17 DIAGNOSIS — Z66 Do not resuscitate: Secondary | ICD-10-CM | POA: Insufficient documentation

## 2015-02-17 DIAGNOSIS — Z9189 Other specified personal risk factors, not elsewhere classified: Secondary | ICD-10-CM

## 2015-02-17 DIAGNOSIS — D72829 Elevated white blood cell count, unspecified: Secondary | ICD-10-CM

## 2015-02-17 LAB — COMPREHENSIVE METABOLIC PANEL
ALBUMIN: 3 g/dL — AB (ref 3.5–5.0)
ALK PHOS: 89 U/L (ref 38–126)
ALT: 14 U/L (ref 14–54)
ANION GAP: 11 (ref 5–15)
AST: 19 U/L (ref 15–41)
BUN: 14 mg/dL (ref 6–20)
CHLORIDE: 93 mmol/L — AB (ref 101–111)
CO2: 34 mmol/L — AB (ref 22–32)
Calcium: 9 mg/dL (ref 8.9–10.3)
Creatinine, Ser: 0.64 mg/dL (ref 0.44–1.00)
GFR calc Af Amer: >60 mL/min (ref >60)
GFR calc non Af Amer: >60 mL/min (ref >60)
GLUCOSE: 116 mg/dL — AB (ref 65–99)
POTASSIUM: 2.9 mmol/L — AB (ref 3.5–5.1)
SODIUM: 138 mmol/L (ref 135–145)
Total Bilirubin: 1 mg/dL (ref 0.3–1.2)
Total Protein: 6.8 g/dL (ref 6.5–8.1)

## 2015-02-17 LAB — CBC
HEMATOCRIT: 38.8 % (ref 36.0–46.0)
HEMOGLOBIN: 12.3 g/dL (ref 12.0–15.0)
MCH: 27.4 pg (ref 26.0–34.0)
MCHC: 31.7 g/dL (ref 30.0–36.0)
MCV: 86.4 fL (ref 78.0–100.0)
Platelets: 248 10*3/uL (ref 150–400)
RBC: 4.49 MIL/uL (ref 3.87–5.11)
RDW: 14.9 % (ref 11.5–15.5)
WBC: 12.9 10*3/uL — ABNORMAL HIGH (ref 4.0–10.5)

## 2015-02-17 LAB — GLUCOSE, CAPILLARY: Glucose-Capillary: 119 mg/dL — ABNORMAL HIGH (ref 65–99)

## 2015-02-17 MED ORDER — MORPHINE SULFATE (CONCENTRATE) 10 MG/0.5ML PO SOLN
5.0000 mg | ORAL | Status: DC | PRN
  Administered 2015-02-17 (×3): 10 mg via ORAL
  Filled 2015-02-17 (×3): qty 0.5

## 2015-02-17 MED ORDER — CHLORHEXIDINE GLUCONATE 0.12 % MT SOLN
15.0000 mL | Freq: Two times a day (BID) | OROMUCOSAL | Status: DC
Start: 2015-02-17 — End: 2015-02-20
  Administered 2015-02-17 – 2015-02-20 (×5): 15 mL via OROMUCOSAL
  Filled 2015-02-17 (×5): qty 15

## 2015-02-17 MED ORDER — SENNOSIDES-DOCUSATE SODIUM 8.6-50 MG PO TABS
1.0000 | ORAL_TABLET | Freq: Two times a day (BID) | ORAL | Status: DC
  Administered 2015-02-17 – 2015-02-20 (×7): 1 via ORAL
  Filled 2015-02-17 (×7): qty 1

## 2015-02-17 MED ORDER — NICOTINE 14 MG/24HR TD PT24
14.0000 mg | MEDICATED_PATCH | Freq: Every day | TRANSDERMAL | Status: DC
  Administered 2015-02-17 – 2015-02-20 (×4): 14 mg via TRANSDERMAL
  Filled 2015-02-17 (×4): qty 1

## 2015-02-17 MED ORDER — POTASSIUM CHLORIDE 20 MEQ/15ML (10%) PO SOLN
40.0000 meq | Freq: Three times a day (TID) | ORAL | Status: DC
  Administered 2015-02-17 – 2015-02-18 (×4): 40 meq via ORAL
  Filled 2015-02-17 (×4): qty 30

## 2015-02-17 MED ORDER — SODIUM CHLORIDE 0.9 % IV SOLN
1.5000 g | Freq: Four times a day (QID) | INTRAVENOUS | Status: DC
  Administered 2015-02-17 – 2015-02-19 (×8): 1.5 g via INTRAVENOUS
  Filled 2015-02-17 (×11): qty 1.5

## 2015-02-17 MED ORDER — PHENOL 1.4 % MT LIQD
1.0000 | OROMUCOSAL | Status: DC | PRN
  Filled 2015-02-17: qty 177

## 2015-02-17 MED ORDER — LIDOCAINE VISCOUS 2 % MT SOLN
15.0000 mL | OROMUCOSAL | Status: DC | PRN
  Filled 2015-02-17: qty 15

## 2015-02-17 MED ORDER — LIP MEDEX EX OINT
TOPICAL_OINTMENT | CUTANEOUS | Status: DC | PRN
  Administered 2015-02-18: 22:00:00 via TOPICAL
  Filled 2015-02-17 (×2): qty 7

## 2015-02-17 MED ORDER — ENSURE ENLIVE PO LIQD
237.0000 mL | Freq: Three times a day (TID) | ORAL | Status: DC
  Administered 2015-02-17 – 2015-02-20 (×9): 237 mL via ORAL

## 2015-02-17 NOTE — Progress Notes (Signed)
Nutrition Brief Note  Chart reviewed. Pt with severe malnutrition from weight loss and poor PO intake. Pt desires no nutrition support. Pt has been ordered Ensure supplements. Pt now transitioning to comfort care.  No further nutrition interventions warranted at this time.  Please re-consult as needed.   Clayton Bibles, MS, RD, LDN Pager: [phone] After Hours Pager: [phone]

## 2015-02-17 NOTE — Progress Notes (Signed)
Heard noise in pt room, upon entering room found patient at the edge of the bed attempting to get up to use the bathroom. Bed alarm was on.  IV was out and there was blood everywhere. Assisted patient to bathroom and stopped bleeding. Pt appeared slightly confused. Patient continues to attempt to get out of bed, alarm is set and patient is redirected and reassured.  Pain medication administered, patient rate pain 9/10. Will continue to monitor patient.

## 2015-02-17 NOTE — Evaluation (Signed)
Physical Therapy Evaluation Patient Details Name: [name] MRN: [id number] DOB: [birth year] Today's Date: 02/17/2015   History of Present Illness  75 yo female admitted with aspiration risk. Hx of nasopharyngeal carcinoma, HTN.   Clinical Impression  On eval, pt required Min assist for mobility-walked in room only. Pt refused to ambulate in hallway. Pt is confused and not really following commands. Only able to observe gait because pt was looking for her clothes. Some difficulty with redirection. No family present during session. Per chart, pt to d/c home with hospice. Highly recommend 24 hour supervision/assist. Pt is at high risk for falls.     Follow Up Recommendations Supervision/Assistance - 24 hour    Equipment Recommendations  Rolling walker with 5" wheels;Wheelchair ;Wheelchair cushion (if family wants--may need due to instability/risk for falls)   Recommendations for Other Services       Precautions / Restrictions Precautions Precautions: Fall Restrictions Weight Bearing Restrictions: No      Mobility  Bed Mobility Overal bed mobility: Needs Assistance Bed Mobility: Supine to Sit;Sit to Supine     Supine to sit: Supervision Sit to supine: Supervision   General bed mobility comments: for safety  Transfers Overall transfer level: Needs assistance Equipment used: 1 person hand held assist Transfers: Sit to/from Stand Sit to Stand: Min assist         General transfer comment: Very unsteady. Not following commands-increased risk for falls  Ambulation/Gait Ambulation/Gait assistance: Min assist Ambulation Distance (Feet): 10 Feet Assistive device: 1 person hand held assist Gait Pattern/deviations: Step-through pattern;Decreased stride length     General Gait Details: Assist to stabilize. Very unsteady. Pt walked around room looking for clothes. confused. Refused to ambulate in hallway  Stairs            Wheelchair Mobility    Modified Rankin  (Stroke Patients Only)       Balance Overall balance assessment: Needs assistance         Standing balance support: During functional activity Standing balance-Leahy Scale: Poor                               Pertinent Vitals/Pain Pain Assessment: No/denies pain    Home Living Family/patient expects to be discharged to:: Other (Comment) (family memeber's home) Living Arrangements: Alone Available Help at Discharge: Family Type of Home: House         Home Equipment: None      Prior Function           Comments: unsure of PLOF-pt unable to provide     Hand Dominance        Extremity/Trunk Assessment   Upper Extremity Assessment: Difficult to assess due to impaired cognition           Lower Extremity Assessment: Difficult to assess due to impaired cognition (pt able to weightbear)      Cervical / Trunk Assessment: Kyphotic  Communication   Communication: No difficulties  Cognition Arousal/Alertness: Awake/alert Behavior During Therapy: Restless Overall Cognitive Status: No family/caregiver present to determine baseline cognitive functioning Area of Impairment: Orientation;Attention;Following commands;Safety/judgement Orientation Level: Place;Disoriented to;Time;Situation Current Attention Level: Focused Memory: Decreased recall of precautions Following Commands: Follows one step commands inconsistently Safety/Judgement: Decreased awareness of deficits;Decreased awareness of safety          General Comments      Exercises        Assessment/Plan    PT Assessment Patient needs continued  PT services  PT Diagnosis Difficulty walking;Generalized weakness;Altered mental status   PT Problem List Decreased strength;Decreased activity tolerance;Decreased balance;Decreased cognition;Decreased safety awareness;Decreased mobility  PT Treatment Interventions DME instruction;Gait training;Functional mobility training;Therapeutic  activities;Patient/family education;Balance training;Therapeutic exercise   PT Goals (Current goals can be found in the Care Plan section) Acute Rehab PT Goals Patient Stated Goal: none stated PT Goal Formulation: Patient unable to participate in goal setting Time For Goal Achievement: 03/03/15 Potential to Achieve Goals: Poor    Frequency Min 3X/week   Barriers to discharge        Co-evaluation               End of Session   Activity Tolerance: Treatment limited secondary to agitation Patient left: in bed;with call bell/phone within reach;with bed alarm set           Time: 1552-1600 PT Time Calculation (min) (ACUTE ONLY): 8 min   Charges:   PT Evaluation $PT Eval Moderate Complexity: 1 Procedure     PT G Codes:        Weston Anna, MPT Pager: [phone]

## 2015-02-17 NOTE — Consult Note (Signed)
Consultation Note Date: 02/17/2015   Patient Name: [name]  DOB: 1940-06-30  MRN: [id number]  Age / Sex: 75 y.o., female  PCP: Biagio Borg, MD Referring Physician: Charlynne Cousins, MD  Reason for Consultation: Disposition, Establishing goals of care, Hospice Evaluation and Pain control    Clinical Assessment/Narrative: [name] is a wonderful 75 yo lady with advanced head and neck cancer biopsied/diagnosed in early January 2017.  She also has a history of HTN, HLD.  She was directly admitted 1/18 from Dr. Calton Dach office for possible aspiration/elevated calcium/and intractable pain.  She has had significant weight loss recently and now weighs approximately 90 lbs.  She has odynaphagia and consumes primarily ensure.  Per EPIC notes she has had several episodes of altered mental status as well as confusion.  MRI performed 1/18 shows progression of the tumor since 01/27/15 with skull base destruction and infiltration into the brain and cervical spine.  Oncology recommends palliative treatment only.  This morning [name] is very pleasant and coherent.  Her career with was [employer] and she still smokes 1/2 ppd.  She cared for her husband who died of cancer.  She also cared for her mother thru her death.  She states it is important that she remain independent as long as possible.  She does not want to be a burden on her family.  When she is unable to do for herself she wants to be placed somewhere.  She currently lives at home, and her son Hendricks Milo) and grand-daugther Anne Fu) live with her.  We discussed code status and resuscitation - she stated, "I don't want any of that, when It's my time I just want to go peacefully".   She indicates that she has throat pain.    After speaking with [name], I talked with her grand daughter [name].  [name] indicated that she and her sister, Anne Fu have joint HCPOA.  [name] supported her grand mother's DNR decision and indicated that she is interested in Hospice services at home.  [name] mentioned that the patient's son, Hendricks Milo - whom the patient originally wanted as her HCPOA, is going to alcohol and drug rehab currently.  Contacts/Participants in Discussion: Patient and subsequently grand dtr, [name] Primary Decision Maker: Patient Relationship to Patient self HCPOA: [name] and Anne Fu   SUMMARY OF RECOMMENDATIONS   DNR / DNI.  Will refer to social work to start the process of obtaining Hospice services in the home.  Tobacco addiction:  Ordered nicotine patch  Pain control:  Will order lidocaine viscous solution, and roxanol 5-10 mg q 2 hours PRN pain.  Soft diet with aspiration precautions.  Will request a chaplain visit  Code Status/Advance Care Planning: DNR    Code Status Orders        Start     Ordered   02/17/15 0857  Do not attempt resuscitation (DNR)   Continuous    Question Answer Comment  In the event of cardiac or respiratory ARREST Do not call a "code blue"   In the event of cardiac or respiratory ARREST Do not perform Intubation, CPR, defibrillation or ACLS   In the event of cardiac or respiratory ARREST Use medication by any route, position, wound care, and other measures to relive pain and suffering. May use oxygen, suction and manual treatment of airway obstruction as needed for comfort.      02/17/15 0856    Code Status History    Date Active Date Inactive Code Status Order ID  Comments User Context   02/16/2015  1:52 PM 02/17/2015  8:56 AM Full Code [id number]  Robbie Lis, MD Inpatient   04/22/2013  8:48 AM 04/23/2013  7:34 PM Full Code [id number]  Caren Griffins, MD ED   12/05/2012  6:30 AM 12/07/2012  7:30 PM Full Code [id number]  Phillips Grout, MD ED   07/24/2012 11:50 PM 07/27/2012  3:48 PM Full Code [id number]  Orvan Falconer, MD ED    Advance Directive Documentation        Most Recent Value   Type of Advance Directive   Living will, Healthcare Power of Attorney   Pre-existing out of facility DNR order (yellow form or pink MOST form)     "MOST" Form in Place?        Other Directives:None  Symptom Management:   As above.  Palliative Prophylaxis:   Aspiration, Delirium Protocol and Frequent Pain Assessment  Additional Recommendations (Limitations, Scope, Preferences):  Would utilize IV antibiotics and Fluids for now as indicated  Will attempt to meet with family / patient in person.  Psycho-social/Spiritual:  Support System: Strong Desire for further Chaplaincy support: yes Additional Recommendations: Caregiving  Support/Resources and Education on Hospice  Prognosis: < 3 months.  The patient is losing significant weight and having periods of altered mental status likely as a result of her advanced head and neck cancer.  Discharge Planning: Home with Hospice in 24 - 48 hours as deemed appropriate by the primary team.   Chief Complaint/ Primary Diagnoses: Present on Admission:  . Cancer associated pain . 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 Protein-calorie malnutrition, severe ([place]) . FTT (failure to thrive) in adult . Hypokalemia . Leukocytosis . Hypercalcemia . Essential hypertension, benign . Dyslipidemia  I have reviewed the medical record, interviewed the patient and family, and examined the patient. The following aspects are pertinent.  History reviewed. No pertinent past medical history. Social History   Social History  . Marital Status: Widowed    Spouse Name: N/A  . Number of Children: 2  . Years of Education: N/A   Occupational History  . 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 Smoking status: Current Every Day Smoker -- 0.50 packs/day  . Smokeless tobacco: Never Used  . Alcohol Use: Yes     Comment: beer occasionally  . Drug Use: No  . Sexual Activity: No   Other Topics Concern  . None   Social History  Narrative   Family History  Problem Relation Age of Onset  . Cancer Son     Possible colon cancer  . Heart attack Other   . Stroke Other   . Alcohol abuse Other   . Diabetes Other   . Hypertension Other    Scheduled Meds: . amLODipine  10 mg Oral Daily  . aspirin EC  81 mg Oral Daily  . atenolol  50 mg Oral Daily  . atorvastatin  20 mg Oral q1800  . feeding supplement (ENSURE ENLIVE)  237 mL Oral TID BM  . nicotine  14 mg Transdermal Daily  . piperacillin-tazobactam (ZOSYN)  IV  3.375 g Intravenous Q8H   Continuous Infusions: . sodium chloride 75 mL/hr at 02/16/15 1409   PRN Meds:.acetaminophen **OR** acetaminophen, lidocaine, lip balm, morphine CONCENTRATE, ondansetron **OR** ondansetron (ZOFRAN) IV, phenol Medications Prior to Admission:  Prior to Admission medications   Medication Sig Start Date End Date Taking? Authorizing Provider  amLODipine (NORVASC) 10 MG tablet Take 1  tablet (10 mg total) by mouth daily. 01/06/15  Yes Biagio Borg, MD  aspirin 81 MG tablet Take 81 mg by mouth daily. Reported on 02/16/2015   Yes Historical Provider, MD  atenolol (TENORMIN) 50 MG tablet Take 1 tablet (50 mg total) by mouth daily. 01/06/15  Yes Biagio Borg, MD  atorvastatin (LIPITOR) 20 MG tablet Take 1 tablet (20 mg total) by mouth daily at 6 PM. ---patient needs office visit before any further refills 12/16/14  Yes Biagio Borg, MD  HYDROcodone-acetaminophen (NORCO/VICODIN) 5-325 MG tablet Take 1 tablet by mouth every 6 (six) hours as needed for moderate pain. 02/04/15  Yes Rozetta Nunnery, MD   Allergies  Allergen Reactions  . Tramadol Other (See Comments)    constipation    Review of Systems  Constitutional: Positive for diaphoresis, activity change, appetite change and unexpected weight change.  HENT: Positive for voice change.   Eyes: Negative.   Respiratory: Positive for cough.   Cardiovascular: Negative.   Gastrointestinal: Positive for constipation.  Endocrine: Negative.    Genitourinary: Negative.   Musculoskeletal: Positive for neck pain and neck stiffness.  Skin: Negative.   Allergic/Immunologic: Negative.   Neurological: Positive for dizziness, weakness and headaches.  Hematological: Negative.   Psychiatric/Behavioral: Positive for confusion.    Physical Exam  Vital Signs: BP 154/63 mmHg  Pulse 60  Temp(Src) 98 F (36.7 C) (Oral)  Resp 16  Ht 5\' 3"  (1.6 m)  Wt 41.7 kg (91 lb 14.9 oz)  BMI 16.29 kg/m2  SpO2 95%  SpO2: SpO2: 95 % O2 Device:SpO2: 95 % O2 Conley Rate: .   IO: Intake/output summary: No intake or output data in the 24 hours ending 02/17/15 0912  LBM: Last BM Date: 02/15/15 Baseline Weight: Weight: 41.2 kg (90 lb 13.3 oz) Most recent weight: Weight: 41.7 kg (91 lb 14.9 oz)      Palliative Assessment/Data:  Flowsheet Rows        Most Recent Value   Intake Tab    Referral Department  Oncology   Unit at Time of Referral  Med/Surg Unit   Palliative Care Primary Diagnosis  Cancer   Date Notified  02/17/15   Palliative Care Type  New Palliative care   Reason for referral  Pain, Clarify Goals of Care, Counsel Regarding Hospice   Date of Admission  02/16/15   Date first seen by Palliative Care  02/17/15   # of days Palliative referral response time  0 Day(s)   # of days IP prior to Palliative referral  1   Clinical Assessment    Palliative Performance Scale Score  40%   Pain Max last 24 hours  7   Pain Min Last 24 hours  2   Psychosocial & Spiritual Assessment    Social Work Plan of Care  Education on Hospice, Clarified patient/family wishes with healthcare team   Palliative Care Outcomes    Patient/Family meeting held?  Yes   Palliative Care Outcomes  Improved pain interventions, Clarified goals of care   Patient/Family wishes: Interventions discontinued/not started   Mechanical Ventilation, PEG   Palliative Care follow-up planned  Yes, Facility      Additional Data Reviewed:  CBC:    Component Value Date/Time    WBC 12.9* 02/17/2015 0420   WBC 13.8* 02/16/2015 1133   HGB 12.3 02/17/2015 0420   HGB 14.0 02/16/2015 1133   HCT 38.8 02/17/2015 0420   HCT 43.2 02/16/2015 1133   PLT 248 02/17/2015 0420  PLT 248 02/16/2015 1133   MCV 86.4 02/17/2015 0420   MCV 84.0 02/16/2015 1133   NEUTROABS 10.2* 02/16/2015 1450   NEUTROABS 12.2* 02/16/2015 1133   LYMPHSABS 0.8 02/16/2015 1450   LYMPHSABS 0.4* 02/16/2015 1133   MONOABS 1.5* 02/16/2015 1450   MONOABS 1.1* 02/16/2015 1133   EOSABS 0.0 02/16/2015 1450   EOSABS 0.0 02/16/2015 1133   BASOSABS 0.0 02/16/2015 1450   BASOSABS 0.0 02/16/2015 1133   Comprehensive Metabolic Panel:    Component Value Date/Time   NA 138 02/17/2015 0420   NA 138 02/16/2015 1133   K 2.9* 02/17/2015 0420   K 2.7 Repeated and Verified* 02/16/2015 1133   CL 93* 02/17/2015 0420   CO2 34* 02/17/2015 0420   CO2 33* 02/16/2015 1133   BUN 14 02/17/2015 0420   BUN 18.7 02/16/2015 1133   CREATININE 0.64 02/17/2015 0420   CREATININE 0.9 02/16/2015 1133   GLUCOSE 116* 02/17/2015 0420   GLUCOSE 191* 02/16/2015 1133   CALCIUM 9.0 02/17/2015 0420   CALCIUM 10.6* 02/16/2015 1133   AST 19 02/17/2015 0420   AST 16 02/16/2015 1133   ALT 14 02/17/2015 0420   ALT 12 02/16/2015 1133   ALKPHOS 89 02/17/2015 0420   ALKPHOS 110 02/16/2015 1133   BILITOT 1.0 02/17/2015 0420   BILITOT 0.66 02/16/2015 1133   PROT 6.8 02/17/2015 0420   PROT 8.3 02/16/2015 1133   ALBUMIN 3.0* 02/17/2015 0420   ALBUMIN 3.1* 02/16/2015 1133     Time In: 8:00 Time Out: 9:10 Time Total: 70 min Greater than 50%  of this time was spent counseling and coordinating care related to the above assessment and plan.  Signed by:  Imogene Burn, PA-C Palliative Medicine Pager: [phone]  02/17/2015, 9:12 AM  Please contact Palliative Medicine Team phone at [phone] for questions and concerns.

## 2015-02-17 NOTE — Progress Notes (Signed)
Notified by [facility] of family request for Hospice and [facility] services at home after discharge.   Met with Anne Fu to initiate education related to hospice philosophy, services and team approach to care. Patient confused at time of visit and unable to participate in conversation.  Markita tearful as she stated this diagnosis has happened very quickly and she has noticed a decline in her mother's status over the past 24 hours.  She reported that her mother has had no meaningful po intake for days and has had a total weight loss of 20 lbs, since the beginning of this month.  The patient was living in her own home independently, prior to this admission.  Anne Fu is willing to take her mother to her apartment with the help of hospice, however, she does not wish for her mother to die in her apartment.  Anne Fu voiced interest in her mother being transferred to an inpatient hospice facility, if her mother is close to end of life. Markita plans to speak with her sister, [name] who shares joint HCPOA regarding taking her home. Will follow up in the morning, to continue conversation in regards to discharge planning.  Provided contact information to [facility] at time of visit. Offered emotional support.  Please call with any questions. Thank Rhae Lerner RN, [facility] Liaison [phone]

## 2015-02-17 NOTE — Care Management Note (Signed)
Case Management Note  Patient Details  Name: [name] MRN: [id number] Date of Birth: May 15, 1940  Subjective/Objective:        75 yo admitted with Nasopharyngeal CA            Action/Plan: From home alone but plans to DC to granddaughter Markita's home.  Expected Discharge Date:   (unknown)               Expected Discharge Plan:  Home w Hospice Care  In-House Referral:     Discharge planning Services  CM Consult  Post Acute Care Choice:  Hospice Choice offered to:  [name]  DME Arranged:    DME Agency:     HH Arranged:  Disease Management [place] Agency:  [facility]  Status of Service:  In process, will continue to follow  Medicare Important Message Given:    Date Medicare IM Given:    Medicare IM give by:    Date Additional Medicare IM Given:    Additional Medicare Important Message give by:     If discussed at [place] of Stay Meetings, dates discussed:    Additional Comments: This CM first met with pt Granddaughter [name] at bedside as pt was down having a test done. [name] states that her and her sister Anne Fu are helping pt make disposition plans. Plan is for pt to DC to Markita's home at discharge and they would like pt to have hospice services at DC. This CM inquired as to whether pt was aware and ok with plans and she states that she is.  She states that they would like to use the services of [facility].  This CM came back to room when pt returned from test. This CM asked pt about her dc plans and home care needs. I explained that granddaughter and I had talked about her having home hospice care. Pt states that she is ok with this and she thinks she would benefit from hospice services.  This CM made referral to [facility] and will continue to follow. Lynnell Catalan, RN 02/17/2015, 2:33 PM

## 2015-02-17 NOTE — Progress Notes (Signed)
[name]   DOB:[birth year]   [id number]    Subjective: The patient felt better. Her pain is better controlled. She had MRI done yesterday which show significant disease progression. She continues to feel weak. Denies headache today. No fevers or chills.  Objective:  Filed Vitals:   02/17/15 0428 02/17/15 1210  BP: 154/63 143/68  Pulse: 60 73  Temp: 98 F (36.7 C) 98.6 F (37 C)  Resp: 16 18     Intake/Output Summary (Last 24 hours) at 02/17/15 1323 Last data filed at 02/17/15 1100  Gross per 24 hour  Intake    120 ml  Output      0 ml  Net    120 ml    GENERAL:alert, no distress and comfortable. She looks thin and cachectic SKIN: skin color, texture, turgor are normal, no rashes or significant lesions EYES: normal, Conjunctiva are pink and non-injected, sclera clear OROPHARYNX:no exudate, no erythema and lips, buccal mucosa, and tongue normal  Musculoskeletal:no cyanosis of digits and no clubbing  NEURO: alert & oriented x 3 with fluent speech, no focal motor/sensory deficits   Labs:  Lab Results  Component Value Date   WBC 12.9* 02/17/2015   HGB 12.3 02/17/2015   HCT 38.8 02/17/2015   MCV 86.4 02/17/2015   PLT 248 02/17/2015   NEUTROABS 10.2* 02/16/2015    Lab Results  Component Value Date   NA 138 02/17/2015   K 2.9* 02/17/2015   CL 93* 02/17/2015   CO2 34* 02/17/2015    Studies: I reviewed the imaging Mr Jeri Cos Wo Contrast  02/16/2015  CLINICAL DATA:  Squamous cell carcinoma of the nasopharynx, with skullbase involvement. Subsequent encounter. EXAM: MRI HEAD WITHOUT AND WITH CONTRAST; MR NECK SOFT TISSUE ONLY WITHOUT AND WITH CONTRAST TECHNIQUE: Multiplanar, multiecho pulse sequences of the brain and surrounding structures were obtained according to standard protocol without and with intravenous contrast; Multiplanar, multisequence MR imaging of the neck was performed both before and after administration of intravenous contrast. CONTRAST:  8 mL  COMPARISON:  01/27/2015 MRI brain. FINDINGS: There is considerable motion degradation, despite best efforts of the technologist to emphasize the importance of holding still. The study is marginally diagnostic. MR BRAIN: Progression of tumor since the previous study. The clivus is destroyed as is the floor of the sella. The cavernous sinuses bulge mildly, more so on the LEFT, consistent with tumor infiltration. Abnormal enhancement is now seen along the floor of the LEFT middle cranial fossa and tumor likely extends anteriorly to the BILATERAL pterygopalatine fossae. Tumor destruction of the clivus has resulted in dural thickening anterior to the pons continuous with tumor in the upper cervical region. Tumor surrounds the skullbase LEFT internal carotid artery but there is no vascular occlusion. Tumor also surrounds both vertebral arteries, greater on the RIGHT. No acute stroke is evident. No intra-axial brain metastases are seen. Generalized atrophy with chronic microvascular ischemic changes are stable. Chronic LEFT sphenoid sinus disease. No orbital masses. No mastoid fluid. 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 The lesion has grown since the previous study, and measures approximately 62 x 45 x 65 mm (R-L x A-P x C-C). There is destruction of the clivus, as well as anterior arch of C1. Abnormal enhancement can be seen in the ventral epidural space anterior to C1 and C2. Lateral mass of C1 on the RIGHT is invaded. BILATERAL hypoglossal canals cannot be visualized. Tumor involves the parapharyngeal spaces, retropharyngeal space, and  extends superiorly where it is inseparable from the pterygoids. There may be BILATERAL level II lymph nodes incompletely evaluated. Cervical spondylosis. IMPRESSION: Severely motion degraded study demonstrating progression of squamous cell carcinoma of the nasopharynx, with intracranial disease, most notably in the LEFT  middle cranial fossa and BILATERAL LEFT greater than RIGHT cavernous sinuses. Skullbase destruction, with enhancement of the dura in the pre pontine cistern, continuous with epidural tumor extending into the upper cervical region this far caudally as C2. Overall the tumor estimated size is increased, measuring 62 x 45 x 65 mm. Osseous involvement of C1. Bulky nasopharyngeal disease extends into the parapharyngeal, retropharyngeal, and pterygoid masticator spaces. If further delineation desired, and the patient is able to hold still, CT neck with contrast may provide better treatment planning. Electronically Signed   By: Staci Righter M.D.   On: 02/16/2015 20:41   Mr Neck Soft Tissue Only W Wo Contrast  02/16/2015  CLINICAL DATA:  Squamous cell carcinoma of the nasopharynx, with skullbase involvement. Subsequent encounter. EXAM: MRI HEAD WITHOUT AND WITH CONTRAST; MR NECK SOFT TISSUE ONLY WITHOUT AND WITH CONTRAST TECHNIQUE: Multiplanar, multiecho pulse sequences of the brain and surrounding structures were obtained according to standard protocol without and with intravenous contrast; Multiplanar, multisequence MR imaging of the neck was performed both before and after administration of intravenous contrast. CONTRAST:  8 mL COMPARISON:  01/27/2015 MRI brain. FINDINGS: There is considerable motion degradation, despite best efforts of the technologist to emphasize the importance of holding still. The study is marginally diagnostic. MR BRAIN: Progression of tumor since the previous study. The clivus is destroyed as is the floor of the sella. The cavernous sinuses bulge mildly, more so on the LEFT, consistent with tumor infiltration. Abnormal enhancement is now seen along the floor of the LEFT middle cranial fossa and tumor likely extends anteriorly to the BILATERAL pterygopalatine fossae. Tumor destruction of the clivus has resulted in dural thickening anterior to the pons continuous with tumor in the upper cervical  region. Tumor surrounds the skullbase LEFT internal carotid artery but there is no vascular occlusion. Tumor also surrounds both vertebral arteries, greater on the RIGHT. No acute stroke is evident. No intra-axial brain metastases are seen. Generalized atrophy with chronic microvascular ischemic changes are stable. Chronic LEFT sphenoid sinus disease. No orbital masses. No mastoid fluid. 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 The lesion has grown since the previous study, and measures approximately 62 x 45 x 65 mm (R-L x A-P x C-C). There is destruction of the clivus, as well as anterior arch of C1. Abnormal enhancement can be seen in the ventral epidural space anterior to C1 and C2. Lateral mass of C1 on the RIGHT is invaded. BILATERAL hypoglossal canals cannot be visualized. Tumor involves the parapharyngeal spaces, retropharyngeal space, and extends superiorly where it is inseparable from the pterygoids. There may be BILATERAL level II lymph nodes incompletely evaluated. Cervical spondylosis. IMPRESSION: Severely motion degraded study demonstrating progression of squamous cell carcinoma of the nasopharynx, with intracranial disease, most notably in the LEFT middle cranial fossa and BILATERAL LEFT greater than RIGHT cavernous sinuses. Skullbase destruction, with enhancement of the dura in the pre pontine cistern, continuous with epidural tumor extending into the upper cervical region this far caudally as C2. Overall the tumor estimated size is increased, measuring 62 x 45 x 65 mm. Osseous involvement of C1. Bulky nasopharyngeal disease extends into the parapharyngeal, retropharyngeal, and pterygoid masticator spaces. If further delineation desired,  and the patient is able to hold still, CT neck with contrast may provide better treatment planning. Electronically Signed   By: Staci Righter M.D.   On: 02/16/2015 20:41   Dg Chest Port 1 View  02/16/2015   CLINICAL DATA:  Leukocytosis EXAM: PORTABLE CHEST 1 VIEW COMPARISON:  10/07/2013 FINDINGS: Mild hyperinflation. Heart and mediastinal contours are within normal limits. No focal opacities or effusions. No acute bony abnormality. IMPRESSION: Hyperinflation compatible with COPD.  No active disease. Electronically Signed   By: Rolm Baptise M.D.   On: 02/16/2015 15:44    Assessment & Plan:   Carcinoma of posterior wall of nasopharynx ([place]) The patient has significant locally advanced disease with uncontrolled pain, progressive weight loss and failure to thrive. I would not recommend concurrent chemotherapy therapy due to her poor performance status. I recommend palliative care only for now. If she improved further in the future, she may benefit from palliative radiation treatment.  Protein-calorie malnutrition, severe ([place]) She has lost a lot of weight, at least 11 pounds since a visit with her primary care doctor. She is not able to eat because of severe, uncontrolled pain. I'm also concerned about aspiration pneumonia. I recommend dietitian review and swallow assessment  Aspiration pneumonia of left lung ([place]) She cough after swallowing liquids in my office. Her PET/CT scan from Monday showed signs of aspiration pneumonia. She is on IV antibiotics I recommend speech and language therapy assessment when she is hospitalized to see if she needs modified diet  Cancer associated pain She has severe uncontrolled pain, improved now she is on IV pain medications I recommend palliative care service to see her for symptom management while hospitalized  FTT (failure to thrive) in adult She has failure to thrive  Consulting palliative care for symptomatic management  Discharge planning I introduced the concept of palliative care and hospice to the patient and her caregiver. Given her poor performance status and failure to thrive, I am not sure that we could make her better to the point that she  could benefit from further treatment for her cancer. I fully support palliative care consult while hospitalized and consideration for home hospice care soon after discharge.  Hypokalemia On replacement therapy, due to poor oral intake  Will follow  [place], Telicia Hodgkiss, MD 02/17/2015  1:23 PM

## 2015-02-17 NOTE — Progress Notes (Signed)
Modified Barium Swallow Study Results, full report in imaging section   HPI: 75 yo female adm to [facility] from cancer center with FTT and dehydration. PMH + for recently diagnosed nasopharyngeal mass, thyroid nodule - right, COPD, GERD, constipation, PVD, shortness of breath, ETOH and tobacco use. Pt found to have mass involving posterior pharyngeal wall, skull base and CNS. She reports problems swallowing pills and pain with swallowing. MBS recommended by this SLP due to pt's clear CN involvement. Pt did not recalll having a throat mass per conversation with this SLP.   Subjective: pt awake in chair in flouro suite  Assessment / Plan / Recommendation [facility] IP CLINICAL IMPRESSIONS 02/17/2015  Therapy Diagnosis Severe cervical esophageal phase dysphagia;Severe pharyngeal phase dysphagia  Clinical Impression Pt presents with severe pharyngeal and cervical esophageal dysphagia appearing obstructive in nature (suspect due to mass). Very poor clearance of barium into esophagus with only liquids provided due to aspiration risk. Pt requires 9 swallows to clear 90% of moderate sized bolus of nectar. Pt with subsequent minimal laryngeal penetration and trace aspiration due to residuals from pyriform spilling into open larynx. Cough did clear aspirates but risk is present. Pt is high aspiration and malnutrition risk, note palliative referral pending. Postural modifications considered but pt with pain with minimal neck manipulation and therefore not attempted- suspect secondary to bone mets.   Using live video, educated pt to findings/compensation strategies. Pt appeared confused as to her medical diagnosis and will benefit from reinforcement for compensation strategies. Recommend continue liquids ONLY with strict aspiration precautions.   Impact on safety and function Risk for inadequate nutrition/hydration;Moderate aspiration risk      CHL IP TREATMENT RECOMMENDATION 02/17/2015  Treatment  Recommendations Therapy as outlined in treatment plan below     Prognosis 02/17/2015  Prognosis for Safe Diet Advancement Guarded  Barriers to Reach Goals Severity of deficits;Other (Comment)  Barriers/Prognosis Comment --    CHL IP DIET RECOMMENDATION 02/17/2015  SLP Diet Recommendations Thin liquid;Nectar thick liquid  Liquid Administration via Cup;Straw  Medication Administration Via alternative means  Compensations Slow rate;Small sips/bites;Multiple dry swallows after each bite/sip  Postural Changes Remain semi-upright after after feeds/meals (Comment);Seated upright at 90 degrees     CHL IP OTHER RECOMMENDATIONS 02/17/2015  Recommended Consults --  Oral Care Recommendations Oral care BID  Other Recommendations Have oral suction available        Luanna Salk, [facility] SLP [phone]

## 2015-02-17 NOTE — Progress Notes (Signed)
[facility] PROGRESS NOTE    Progress Note   COURTENY MACHO [id number] DOB: 10-24-1940 DOA: 02/16/2015 PCP: Cathlean Cower, MD   Brief Narrative:   [name] is an 75 y.o. 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  Assessment/Plan:   At risk for aspiration/carcinoma of the posterior wall of the nasopharynx: Swallowing evaluation was ordered and they recommended an MBS. Palliative care was consulted, the patient was made a DNR/DNI and they would like referral with hospice services at home. Per oncology notes the patient is not a candidate for chemotherapy. This was explained to the patient. We'll continue lidocaine solution and Roxanol for pain. Change antibiotic regimen to Unasyn. Family would like to move towards comfort care. I spent greater than 35 minutes speaking to the family about her prognosis and goals of care.  Protein-calorie malnutrition, severe (HCC)  Cancer associated pain Continue narcotics lidocaine solutions for pain.  Hypokalemia Probably due to dehydration replete and recheck in the morning.  Hypercalcemia Likely due to dehydration resolved with IV fluid hydration.  Metabolic alkalosis: Likely contraction alkalosis continue IV fluids strict I's and O's.    Essential hypertension, benign: Continue atenolol.    DVT Prophylaxis - Lovenox ordered.  Family Communication: Grandaughters Disposition Plan: Home 3-4 days Code Status:     Code Status Orders        Start     Ordered   02/17/15 0857  Do not attempt resuscitation (DNR)   Continuous    Question Answer Comment  In the event of cardiac or respiratory ARREST Do not call a "code blue"   In the event of cardiac or respiratory ARREST Do not perform Intubation, CPR, defibrillation or ACLS   In the event of cardiac or respiratory ARREST Use medication by any  route, position, wound care, and other measures to relive pain and suffering. May use oxygen, suction and manual treatment of airway obstruction as needed for comfort.      02/17/15 0856    Code Status History    Date Active Date Inactive Code Status Order ID Comments User Context   02/16/2015  1:52 PM 02/17/2015  8:56 AM Full Code [id number]  Robbie Lis, MD Inpatient   04/22/2013  8:48 AM 04/23/2013  7:34 PM Full Code [id number]  Caren Griffins, MD ED   12/05/2012  6:30 AM 12/07/2012  7:30 PM Full Code [id number]  Phillips Grout, MD ED   07/24/2012 11:50 PM 07/27/2012  3:48 PM Full Code [id number]  Orvan Falconer, MD ED    Advance Directive Documentation        Most Recent Value   Type of Advance Directive  Living will, Healthcare Power of Attorney   Pre-existing out of facility DNR order (yellow form or pink MOST form)     "MOST" Form in Place?          IV Access:    Peripheral IV   Procedures and diagnostic studies:   Mr [name] Contrast  02/16/2015  CLINICAL DATA:  Squamous cell carcinoma of the nasopharynx, with skullbase involvement. Subsequent encounter. EXAM: MRI HEAD WITHOUT AND WITH CONTRAST; MR NECK SOFT TISSUE ONLY WITHOUT AND WITH CONTRAST TECHNIQUE: Multiplanar, multiecho pulse sequences of the brain and surrounding structures were obtained according to standard protocol without and with intravenous contrast; Multiplanar, multisequence MR imaging of the neck was performed both before  and after administration of intravenous contrast. CONTRAST:  8 mL COMPARISON:  01/27/2015 MRI brain. FINDINGS: There is considerable motion degradation, despite best efforts of the technologist to emphasize the importance of holding still. The study is marginally diagnostic. MR BRAIN: Progression of tumor since the previous study. The clivus is destroyed as is the floor of the sella. The cavernous sinuses bulge mildly, more so on the LEFT, consistent with tumor infiltration. Abnormal enhancement is now  seen along the floor of the LEFT middle cranial fossa and tumor likely extends anteriorly to the BILATERAL pterygopalatine fossae. Tumor destruction of the clivus has resulted in dural thickening anterior to the pons continuous with tumor in the upper cervical region. Tumor surrounds the skullbase LEFT internal carotid artery but there is no vascular occlusion. Tumor also surrounds both vertebral arteries, greater on the RIGHT. No acute stroke is evident. No intra-axial brain metastases are seen. Generalized atrophy with chronic microvascular ischemic changes are stable. Chronic LEFT sphenoid sinus disease. No orbital masses. No mastoid fluid. 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 The lesion has grown since the previous study, and measures approximately 62 x 45 x 65 mm (R-L x A-P x C-C). There is destruction of the clivus, as well as anterior arch of C1. Abnormal enhancement can be seen in the ventral epidural space anterior to C1 and C2. Lateral mass of C1 on the RIGHT is invaded. BILATERAL hypoglossal canals cannot be visualized. Tumor involves the parapharyngeal spaces, retropharyngeal space, and extends superiorly where it is inseparable from the pterygoids. There may be BILATERAL level II lymph nodes incompletely evaluated. Cervical spondylosis. IMPRESSION: Severely motion degraded study demonstrating progression of squamous cell carcinoma of the nasopharynx, with intracranial disease, most notably in the LEFT middle cranial fossa and BILATERAL LEFT greater than RIGHT cavernous sinuses. Skullbase destruction, with enhancement of the dura in the pre pontine cistern, continuous with epidural tumor extending into the upper cervical region this far caudally as C2. Overall the tumor estimated size is increased, measuring 62 x 45 x 65 mm. Osseous involvement of C1. Bulky nasopharyngeal disease extends into the parapharyngeal, retropharyngeal, and  pterygoid masticator spaces. If further delineation desired, and the patient is able to hold still, CT neck with contrast may provide better treatment planning. Electronically Signed   By: Staci Righter M.D.   On: 02/16/2015 20:41   Mr Neck Soft Tissue Only W Conley Contrast  02/16/2015  CLINICAL DATA:  Squamous cell carcinoma of the nasopharynx, with skullbase involvement. Subsequent encounter. EXAM: MRI HEAD WITHOUT AND WITH CONTRAST; MR NECK SOFT TISSUE ONLY WITHOUT AND WITH CONTRAST TECHNIQUE: Multiplanar, multiecho pulse sequences of the brain and surrounding structures were obtained according to standard protocol without and with intravenous contrast; Multiplanar, multisequence MR imaging of the neck was performed both before and after administration of intravenous contrast. CONTRAST:  8 mL COMPARISON:  01/27/2015 MRI brain. FINDINGS: There is considerable motion degradation, despite best efforts of the technologist to emphasize the importance of holding still. The study is marginally diagnostic. MR BRAIN: Progression of tumor since the previous study. The clivus is destroyed as is the floor of the sella. The cavernous sinuses bulge mildly, more so on the LEFT, consistent with tumor infiltration. Abnormal enhancement is now seen along the floor of the LEFT middle cranial fossa and tumor likely extends anteriorly to the BILATERAL pterygopalatine fossae. Tumor destruction of the clivus has resulted in dural thickening anterior to the pons continuous with tumor in  the upper cervical region. Tumor surrounds the skullbase LEFT internal carotid artery but there is no vascular occlusion. Tumor also surrounds both vertebral arteries, greater on the RIGHT. No acute stroke is evident. No intra-axial brain metastases are seen. Generalized atrophy with chronic microvascular ischemic changes are stable. Chronic LEFT sphenoid sinus disease. No orbital masses. No mastoid fluid. MR NECK:Large squamous cell carcinoma of the  nasopharynx with skull base invasion is redemonstrated. The lesion has grown since the previous study, and measures approximately 62 x 45 x 65 mm (R-L x A-P x C-C). There is destruction of the clivus, as well as anterior arch of C1. Abnormal enhancement can be seen in the ventral epidural space anterior to C1 and C2. Lateral mass of C1 on the RIGHT is invaded. BILATERAL hypoglossal canals cannot be visualized. Tumor involves the parapharyngeal spaces, retropharyngeal space, and extends superiorly where it is inseparable from the pterygoids. There may be BILATERAL level II lymph nodes incompletely evaluated. Cervical spondylosis. IMPRESSION: Severely motion degraded study demonstrating progression of squamous cell carcinoma of the nasopharynx, with intracranial disease, most notably in the LEFT middle cranial fossa and BILATERAL LEFT greater than RIGHT cavernous sinuses. Skullbase destruction, with enhancement of the dura in the pre pontine cistern, continuous with epidural tumor extending into the upper cervical region this far caudally as C2. Overall the tumor estimated size is increased, measuring 62 x 45 x 65 mm. Osseous involvement of C1. Bulky nasopharyngeal disease extends into the parapharyngeal, retropharyngeal, and pterygoid masticator spaces. If further delineation desired, and the patient is able to hold still, CT neck with contrast may provide better treatment planning. Electronically Signed   By: Staci Righter M.D.   On: 02/16/2015 20:41   Dg Chest Port 1 View  02/16/2015  CLINICAL DATA:  Leukocytosis EXAM: PORTABLE CHEST 1 VIEW COMPARISON:  10/07/2013 FINDINGS: Mild hyperinflation. Heart and mediastinal contours are within normal limits. No focal opacities or effusions. No acute bony abnormality. IMPRESSION: Hyperinflation compatible with COPD.  No active disease. Electronically Signed   By: Rolm Baptise M.D.   On: 02/16/2015 15:44     Medical Consultants:    None.  Anti-Infectives:    Anti-infectives    Start     Dose/Rate Route Frequency Ordered Stop   02/16/15 1530  piperacillin-tazobactam (ZOSYN) IVPB 3.375 g     3.375 g 12.5 mL/hr over 240 Minutes Intravenous Every 8 hours 02/16/15 1511        Subjective:    Roshini A Lindenbaum pain not well controlled.  Objective:    Filed Vitals:   02/16/15 1306 02/16/15 2144 02/17/15 0428  BP: 157/63 130/56 154/63  Pulse: 83 65 60  Temp: 97.6 F (36.4 C) 98.7 F (37.1 C) 98 F (36.7 C)  TempSrc: Oral Oral Oral  Resp: 16 16 16   Height: 5\' 3"  (1.6 m)    Weight: 41.2 kg (90 lb 13.3 oz)  41.7 kg (91 lb 14.9 oz)  SpO2: 98% 96% 95%   No intake or output data in the 24 hours ending 02/17/15 1008 Filed Weights   02/16/15 1306 02/17/15 0428  Weight: 41.2 kg (90 lb 13.3 oz) 41.7 kg (91 lb 14.9 oz)    Exam: Gen:  NAD, cachectic Cardiovascular:  RRR. Chest and lungs:   CTAB Abdomen:  Abdomen soft, NT/ND, + BS Extremities:  No C/E/C   Data Reviewed:    Labs: Basic Metabolic Panel:  Recent Labs Lab 02/16/15 1133 02/16/15 1450 02/17/15 0420  NA 138 144 138  K 2.7 Repeated and Verified* 3.5 2.9*  CL  --  94* 93*  CO2 33* 38* 34*  GLUCOSE 191* 128* 116*  BUN 18.7 18 14   CREATININE 0.9 0.81 0.64  CALCIUM 10.6* 10.0 9.0  MG 2.3 1.8  --   PHOS  --  3.0  --    GFR Estimated Creatinine Clearance: 41.2 mL/min (by C-G formula based on Cr of 0.64). Liver Function Tests:  Recent Labs Lab 02/16/15 1133 02/16/15 1450 02/17/15 0420  AST 16 19 19   ALT 12 13* 14  ALKPHOS 110 92 89  BILITOT 0.66 0.5 1.0  PROT 8.3 7.9 6.8  ALBUMIN 3.1* 3.4* 3.0*   No results for input(s): LIPASE, AMYLASE in the last 168 hours. No results for input(s): AMMONIA in the last 168 hours. Coagulation profile  Recent Labs Lab 02/16/15 1450  INR 1.10    CBC:  Recent Labs Lab 02/16/15 1133 02/16/15 1450 02/17/15 0420  WBC 13.8* 12.5* 12.9*  NEUTROABS 12.2* 10.2*  --   HGB 14.0 13.3 12.3  HCT 43.2 42.1 38.8  MCV  84.0 88.4 86.4  PLT 248 239 248   Cardiac Enzymes: No results for input(s): CKTOTAL, CKMB, CKMBINDEX, TROPONINI in the last 168 hours. BNP (last 3 results) No results for input(s): PROBNP in the last 8760 hours. CBG:  Recent Labs Lab 02/14/15 1421 02/17/15 0750  GLUCAP 122* 119*   D-Dimer: No results for input(s): DDIMER in the last 72 hours. Hgb A1c: No results for input(s): HGBA1C in the last 72 hours. Lipid Profile: No results for input(s): CHOL, HDL, LDLCALC, TRIG, CHOLHDL, LDLDIRECT in the last 72 hours. Thyroid function studies: No results for input(s): TSH, T4TOTAL, T3FREE, THYROIDAB in the last 72 hours.  Invalid input(s): FREET3 Anemia work up: No results for input(s): VITAMINB12, FOLATE, FERRITIN, TIBC, IRON, RETICCTPCT in the last 72 hours. Sepsis Labs:  Recent Labs Lab 02/16/15 1133 02/16/15 1450 02/17/15 0420  WBC 13.8* 12.5* 12.9*   Microbiology No results found for this or any previous visit (from the past 240 hour(s)).   Medications:   . amLODipine  10 mg Oral Daily  . aspirin EC  81 mg Oral Daily  . atenolol  50 mg Oral Daily  . atorvastatin  20 mg Oral q1800  . feeding supplement (ENSURE ENLIVE)  237 mL Oral TID BM  . nicotine  14 mg Transdermal Daily  . piperacillin-tazobactam (ZOSYN)  IV  3.375 g Intravenous Q8H  . senna-docusate  1 tablet Oral BID   Continuous Infusions: . sodium chloride 75 mL/hr at 02/16/15 1409    Time spent: 35 min   LOS: 1 day   Charlynne Cousins  [facility] Pager [phone]  *Please refer to [email or web address], password [email or web address] to get updated schedule on who will round on this patient, as hospitalists switch teams weekly. If 7PM-7AM, please contact night-coverage at [email or web address], password [email or web address] for any overnight needs.  02/17/2015, 10:08 AM

## 2015-02-17 NOTE — Consult Note (Signed)
DENTAL CONSULTATION  Date of Consultation:  02/17/2015 Patient Name:   [name] Date of Birth:   1940-05-15 Medical Record Number: [id number]  VITALS: BP 154/63 mmHg  Pulse 60  Temp(Src) 98 F (36.7 C) (Oral)  Resp 16  Ht 5\' 3"  (1.6 m)  Wt 91 lb 14.9 oz (41.7 kg)  BMI 16.29 kg/m2  SpO2 95%  CHIEF COMPLAINT: Patient referred by Dr. Isidore Moos and Dr. Alvy Bimler for dental consultation.  HPI: [name] is a 75 year old female recently diagnosed with nasopharyngeal carcinoma. Patient with anticipated radiation therapy and possible chemotherapy. Patient now seen as part of a pre-chemoradiation therapy dental protocol examination.  The patient currently denies toothaches, swellings, or abscesses. Patient has not seen a dentist in a long time. " It's been a minute" . Patient had a tooth pulled at that time with no complications. Patient does not have a primary dentist by report. Patient denies having any partial dentures. Patient is not interested in having teeth pulled at this time and indicates that  "I would need to be put to sleep " if teeth are to be extracted.     PROBLEM LIST: Patient Active Problem List   Diagnosis Date Noted  . Palliative care encounter   . DNR (do not resuscitate)   . 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 Cancer associated pain 02/16/2015  . FTT (failure to thrive) in adult 02/16/2015  . Hypokalemia 02/16/2015  . Leukocytosis 02/16/2015  . Hypercalcemia 02/16/2015  . Essential hypertension, benign 02/16/2015  . Dyslipidemia 02/16/2015  . At risk for aspiration 02/16/2015  . Protein-calorie malnutrition, severe ([place]) 12/06/2012    PMH: History reviewed. No pertinent past medical history.  PSH: Past Surgical History  Procedure Laterality Date  . S/p bowel obstruction    . Abdominal hysterectomy    . Appendectomy    . Oophorectomy    . Tonsillectomy    . S/p right cea  2/09  . S/p right thyroid nodule biopsy  March  2011    negative  . Carotid endarterectomy    . Cardiac catheterization    . Direct laryngoscopy N/A 02/04/2015    Procedure: DIRECT LARYNGOSCOPY WITH BIOPSY OF NASOPHARYNGEAL MASS;  Surgeon: Rozetta Nunnery, MD;  Location: [place];  Service: ENT;  Laterality: N/A;    ALLERGIES: Allergies  Allergen Reactions  . Tramadol Other (See Comments)    constipation    MEDICATIONS: Current Facility-Administered Medications  Medication Dose Route Frequency Provider Last Rate Last Dose  . 0.9 %  sodium chloride infusion   Intravenous Continuous Robbie Lis, MD 75 mL/hr at 02/16/15 1409    . acetaminophen (TYLENOL) tablet 650 mg  650 mg Oral Q6H PRN Robbie Lis, MD   650 mg at 02/17/15 0140   Or  . acetaminophen (TYLENOL) suppository 650 mg  650 mg Rectal Q6H PRN Robbie Lis, MD      . amLODipine (NORVASC) tablet 10 mg  10 mg Oral Daily Robbie Lis, MD   10 mg at 02/16/15 1608  . ampicillin-sulbactam (UNASYN) 1.5 g in sodium chloride 0.9 % 50 mL IVPB  1.5 g Intravenous Q6H Charlynne Cousins, MD      . aspirin EC tablet 81 mg  81 mg Oral Daily Robbie Lis, MD   81 mg at 02/16/15 1608  . atenolol (TENORMIN) tablet 50 mg  50 mg Oral Daily Robbie Lis, MD   50 mg at 02/16/15  1608  . atorvastatin (LIPITOR) tablet 20 mg  20 mg Oral q1800 Robbie Lis, MD   20 mg at 02/16/15 1607  . feeding supplement (ENSURE ENLIVE) (ENSURE ENLIVE) liquid 237 mL  237 mL Oral TID BM Marianne L York, PA-C      . lidocaine (XYLOCAINE) 2 % viscous mouth solution 15 mL  15 mL Mouth/Throat Q3H PRN Melton Alar, PA-C      . lip balm (CARMEX) ointment   Topical PRN Robbie Lis, MD      . morphine CONCENTRATE 10 MG/0.5ML oral solution 5-10 mg  5-10 mg Oral Q2H PRN Melton Alar, PA-C      . nicotine (NICODERM CQ - dosed in mg/24 hours) patch 14 mg  14 mg Transdermal Daily Marianne L York, PA-C      . ondansetron (ZOFRAN) tablet 4 mg  4 mg Oral Q6H PRN Robbie Lis, MD       Or  .  ondansetron [facility]) injection 4 mg  4 mg Intravenous Q6H PRN Robbie Lis, MD      . phenol (CHLORASEPTIC) mouth spray 1 spray  1 spray Mouth/Throat PRN Melton Alar, PA-C      . potassium chloride 20 MEQ/15ML (10%) solution 40 mEq  40 mEq Oral TID Charlynne Cousins, MD      . senna-docusate (Senokot-S) tablet 1 tablet  1 tablet Oral BID Melton Alar, PA-C        LABS: Lab Results  Component Value Date   WBC 12.9* 02/17/2015   HGB 12.3 02/17/2015   HCT 38.8 02/17/2015   MCV 86.4 02/17/2015   PLT 248 02/17/2015      Component Value Date/Time   NA 138 02/17/2015 0420   NA 138 02/16/2015 1133   K 2.9* 02/17/2015 0420   K 2.7 Repeated and Verified* 02/16/2015 1133   CL 93* 02/17/2015 0420   CO2 34* 02/17/2015 0420   CO2 33* 02/16/2015 1133   GLUCOSE 116* 02/17/2015 0420   GLUCOSE 191* 02/16/2015 1133   BUN 14 02/17/2015 0420   BUN 18.7 02/16/2015 1133   CREATININE 0.64 02/17/2015 0420   CREATININE 0.9 02/16/2015 1133   CALCIUM 9.0 02/17/2015 0420   CALCIUM 10.6* 02/16/2015 1133   GFRNONAA >60 02/17/2015 0420   GFRAA >60 02/17/2015 0420   Lab Results  Component Value Date   INR 1.10 02/16/2015   INR 0.9 02/28/2007   No results found for: PTT  SOCIAL HISTORY: Social History   Social History  . Marital Status: Widowed    Spouse Name: N/A  . Number of Children: 2  . Years of Education: N/A   Occupational History  . 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 Smoking status: Current Every Day Smoker -- 0.50 packs/day  . Smokeless tobacco: Never Used  . Alcohol Use: Yes     Comment: beer occasionally  . Drug Use: No  . Sexual Activity: No   Other Topics Concern  . Not on file   Social History Narrative    FAMILY HISTORY: Family History  Problem Relation Age of Onset  . Cancer Son     Possible colon cancer  . Heart attack Other   . Stroke Other   . Alcohol abuse Other   . Diabetes Other   . Hypertension  Other     REVIEW OF SYSTEMS: Reviewed with patient from Dr. Alvy Bimler note of 02/16/15 with no changes. Constitutional: Denies  fevers, chills or abnormal night sweats Eyes: Denies blurriness of vision, double vision or watery eyes Dental: No acute toothaches or swellings. Cardiovascular: Denies palpitation, chest discomfort or lower extremity swelling Gastrointestinal: Denies nausea, heartburn or change in bowel habits Skin: Denies abnormal skin rashes Lymphatics: Denies new lymphadenopathy or easy bruising Neurological:Denies numbness, tingling or new weaknesses Behavioral/Psych: Mood is stable, no new changes  All other systems were reviewed with the patient and are negative.   DENTAL HISTORY: CHIEF COMPLAINT: Patient referred by Dr. Isidore Moos and Dr. Alvy Bimler for dental consultation.  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 Patient with anticipated radiation therapy and possible chemotherapy. Patient now seen as part of a pre-chemoradiation therapy dental protocol examination.  The patient currently denies toothaches, swellings, or abscesses. Patient has not seen a dentist in a long time. " It's been a minute" . Patient had a tooth pulled at that time with no complications. Patient does not have a primary dentist by report. Patient denies having any partial dentures. Patient is not interested in having teeth pulled at this time and indicates that  "I would need to be put to sleep " if teeth are to be extracted.    DENTAL EXAMINATION: GENERAL: The patient is a well-developed, slightly built female in no acute distress. HEAD AND NECK: Patient has bilateral submandibualr lymphadenopathy.  The patient denies acute TMJ symptoms. Patient has a decreased maximum interincisal opening of 27 mm. INTRAORAL EXAM: Patient has normal saliva. There is no evidence of oral abscess formation. Patient has a large mid-palatal torus. Patient has bilateral  mandibular lingual tori. DENTITION: Patient is missing tooth #1, 2, 4, 5, 16, 17, 19, 30, and 32. PERIODONTAL: Patient has chronic periodontitis with plaque and calculus accumulations, gingival recession, and tooth mobility as charted.  Periodontal charting was not performed secondary to gross accumulations of calculus. There is an increased periodontal ligament space associated with tooth #31 on the mesial secondary to traumatic occlusion. DENTAL CARIES/SUBOPTIMAL RESTORATIONS: Dental caries noted to be affecting tooth numbers 12 and 13. I would need a full series of dental radiographs to rule out other incipient dental caries. ENDODONTIC: Patient currently denies acute pulpitis symptoms. I do not see any evidence of periapical pathology. CROWN AND BRIDGE: There are no crown or bridge restorations. PROSTHODONTIC: Patient denies having partial dentures. OCCLUSION: Patient has a poor occlusal scheme but a stable occlusion. Patient has tooth numbers 10/23 in crossbite.  RADIOGRAPHIC INTERPRETATION: An orthopantogram was taken today in dental medicine. There are multiple missing teeth. There is moderate horizontal and vertical bone loss. Radiographic calculus is noted. No obvious dental caries are noted. There is supra-eruption and drifting of the unopposed teeth into the edentulous areas. There is an increased periodontal ligament space on the mesial aspect of tooth #31.  ASSESSMENTS: 1. Nasopharyngeal carcinoma. 2. Prechemo radiation therapy dental protocol examination 3. Chronic periodontitis with bone loss 4. Gingival recession 5. Accretions 6. Tooth mobility 7. Multiple missing teeth 8. Supra-eruption and drifting of the unopposed teeth into the edentulous areas 9. Traumatic occlusion with increased periodontal ligament space associated with tooth #31 10. Poor occlusal scheme and malocclusion 11. Large mid palatal torus 12. Bilateral mandibular lingual tori 13. Dental caries 14. History  of oral neglect   PLAN/RECOMMENDATIONS: 1. I discussed the risks, benefits, and complications of various treatment options with the patient in relationship to her medical and dental conditions, anticipated radiation therapy and possible chemotherapy, and the risk for xerostomia, radiation caries,  trismus, mucositis, taste changes, gum and jawbone changes, and risk for infection, aspiration pneumonia, and osteoradionecrosis. We discussed various treatment options to include no treatment, multiple extractions with alveoloplasty, pre-prosthetic surgery as indicated, periodontal therapy, dental restorations, root canal therapy, crown and bridge therapy, implant therapy, and replacement of missing teeth as indicated. The patient currently expresses no interest in having teeth pulled at this time or having initial periodontal therapy. Patient would want to be put to sleep with general anesthesia if extractions are performed. Due to the significant progression of her nasopharyngeal cancer and need to start treatment as soon as possible, dental extractions will be deferred at this time. Initial periodontal procedures also would lead to potential worsening of the risk for aspiration pneumonia and other complications. Therefore, no dental treatment will be provided at that time. Patient will be started on chlorhexidine rinses to use on a twice a day basis to assist in disinfection of the oral cavity. Nursing staff will be asked to assist patient in maintenance oral hygiene at this time.    2. Discussion of findings with medical team and coordination of future medical and dental care as needed.    Lenn Cal, DDS

## 2015-02-18 ENCOUNTER — Encounter: Payer: Self-pay | Admitting: *Deleted

## 2015-02-18 ENCOUNTER — Ambulatory Visit: Payer: Medicare Other | Admitting: Radiation Oncology

## 2015-02-18 ENCOUNTER — Ambulatory Visit
Admit: 2015-02-18 | Discharge: 2015-02-18 | Disposition: A | Discharge status: Home / Self Care | Payer: Medicare Other | Attending: Radiation Oncology | Admitting: Radiation Oncology

## 2015-02-18 DIAGNOSIS — R1310 Dysphagia, unspecified: Secondary | ICD-10-CM

## 2015-02-18 LAB — BASIC METABOLIC PANEL
ANION GAP: 8 (ref 5–15)
BUN: 12 mg/dL (ref 6–20)
CALCIUM: 8.9 mg/dL (ref 8.9–10.3)
CO2: 33 mmol/L — AB (ref 22–32)
Chloride: 97 mmol/L — ABNORMAL LOW (ref 101–111)
Creatinine, Ser: 0.55 mg/dL (ref 0.44–1.00)
GFR calc Af Amer: >60 mL/min (ref >60)
GFR calc non Af Amer: >60 mL/min (ref >60)
GLUCOSE: 175 mg/dL — AB (ref 65–99)
Potassium: 3.8 mmol/L (ref 3.5–5.1)
Sodium: 138 mmol/L (ref 135–145)

## 2015-02-18 LAB — GLUCOSE, CAPILLARY: GLUCOSE-CAPILLARY: 88 mg/dL (ref 65–99)

## 2015-02-18 MED ORDER — LIDOCAINE 5 % EX PTCH
1.0000 | MEDICATED_PATCH | CUTANEOUS | Status: DC
  Administered 2015-02-18 – 2015-02-20 (×3): 1 via TRANSDERMAL
  Filled 2015-02-18 (×3): qty 1

## 2015-02-18 MED ORDER — MORPHINE SULFATE (CONCENTRATE) 10 MG/0.5ML PO SOLN
5.0000 mg | ORAL | Status: DC | PRN
  Filled 2015-02-18: qty 0.5

## 2015-02-18 MED ORDER — LIDOCAINE VISCOUS 2 % MT SOLN
15.0000 mL | Freq: Three times a day (TID) | OROMUCOSAL | Status: DC
  Administered 2015-02-18 – 2015-02-20 (×6): 15 mL via OROMUCOSAL
  Filled 2015-02-18 (×13): qty 15

## 2015-02-18 MED ORDER — TEMAZEPAM 7.5 MG PO CAPS
7.5000 mg | ORAL_CAPSULE | Freq: Every evening | ORAL | Status: AC | PRN
  Administered 2015-02-18: 7.5 mg via ORAL
  Filled 2015-02-18: qty 1

## 2015-02-18 MED ORDER — MORPHINE SULFATE (CONCENTRATE) 10 MG/0.5ML PO SOLN
5.0000 mg | ORAL | Status: DC | PRN
  Administered 2015-02-18 – 2015-02-20 (×8): 10 mg via ORAL
  Filled 2015-02-18 (×8): qty 0.5

## 2015-02-18 NOTE — Progress Notes (Signed)
  Oncology Nurse Navigator Documentation Navigator Location: [facility] (02/18/15 1515) Navigator Encounter Type: Other (02/18/15 1515)           Patient Visit Type: Inpatient (02/18/15 1515)       To provide support and encouragement and care continuity, met with Ms [facility] inpatient [id number].  Family members were at the bedside including g-dtrs [name] and Combes.    Ms Tomey was alert and interactive.  I discussed their decisions to pursue hospice with the plan to DC to [facility].  Both g-dtrs were comfortable with the decisions, acknowledged this is what their grandmother wants.  I affirmed their decisions, shared my [place] experiences to further support. They understand I can be contacted.  Gayleen Orem, RN, BSN, [place] [facility] [place] [phone]                               Time Spent with Patient: 30 (02/18/15 1515)

## 2015-02-18 NOTE — Progress Notes (Addendum)
Speech Language Pathology Treatment: Dysphagia  Patient Details Name: [name] MRN: [id number] DOB: [birth year] Today's Date: 02/18/2015 Time: [id number] SLP Time Calculation (min) (ACUTE ONLY): 25 min  Assessment / Plan / Recommendation Clinical Impression  Prior to seeing patient, SLP spoke with her daughter's regarding her swallow function. Discussed results of MBS which was performed yesterday, and revealed that pharyngeal transit of thin liquid bolus was impaired with likely impact of nasopharyngeal mass which is pushing into her pharynx. Re-educated patient regarding her swallow safety goals and observed her with small sips of thin liquids. She exhibited mild, delayed throat clear, which was more significant when she was taking a large straw sip. Do not expect any significant change in patient's swallow function.   HPI HPI: 75 yo female adm [facility] from cancer center with FTT and dehydration.  PMH + for recently diagnosed nasopharyngeal mass, thyroid nodule - right, COPD, GERD, constipation, PVD, shortness of breath, ETOH and tobacco use.  Pt found to have mass involving posterior pharyngeal wall, skull base and CNS.  She reports problems swallowing pills and pain with swallowing.  MBS recommended by this SLP due to pt's clear CN involvement.  Pt did not recalll having a throat mass per conversation with this SLP.        SLP Plan    s/o at this time, as patient is not expected to improve with swallow function.    Recommendations   Continue with thin liquids only, small sips and following aspiration precautions posted in room                Dannial Monarch 02/18/2015, 2:52 PM  Sonia Baller, MA, CCC-SLP 02/18/2015 2:57 PM   Sonia Baller, MA, CCC-SLP 02/18/2015 4:13 PM

## 2015-02-18 NOTE — Progress Notes (Signed)
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 2. 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 Associated skull base erosion and airway narrowing. No evidence of distant metastatic disease. 2. Evidence of pneumonia     Subjective: She is alert. Both granddaughters are present. Pain well controlled. Poor appetite with difficulties eating  Objective:  Filed Vitals:   02/18/15 0537 02/18/15 1302  BP: 158/90 121/55  Pulse: 73 69  Temp: 97.6 F (36.4 C) 98.8 F (37.1 C)  Resp:  18    No intake or output data in the 24 hours ending 02/18/15 1515  GENERAL:alert, no distress and comfortable SKIN: skin color, texture, turgor are normal, no rashes or significant lesions EYES: normal, Conjunctiva are pink and non-injected, sclera clear Musculoskeletal:no cyanosis of digits and no clubbing  NEURO: alert & oriented x 3 with fluent speech, no focal motor/sensory deficits   Labs:  Lab Results  Component Value Date   WBC 12.9* 02/17/2015   HGB 12.3 02/17/2015   HCT 38.8 02/17/2015   MCV 86.4 02/17/2015   PLT 248 02/17/2015   NEUTROABS 10.2* 02/16/2015    Lab Results  Component Value Date   NA 138 02/18/2015   K 3.8 02/18/2015   CL 97* 02/18/2015   CO2 33* 02/18/2015    Studies:  Mr Jeri Cos Wo Contrast  02/16/2015  CLINICAL DATA:  Squamous cell carcinoma of the nasopharynx, with skullbase involvement. Subsequent encounter. EXAM: MRI HEAD WITHOUT AND WITH CONTRAST; MR NECK SOFT TISSUE ONLY WITHOUT AND WITH CONTRAST TECHNIQUE: Multiplanar, multiecho pulse sequences of the  brain and surrounding structures were obtained according to standard protocol without and with intravenous contrast; Multiplanar, multisequence MR imaging of the neck was performed both before and after administration of intravenous contrast. CONTRAST:  8 mL COMPARISON:  01/27/2015 MRI brain. FINDINGS: There is considerable motion degradation, despite best efforts of the technologist to emphasize the importance of holding still. The study is marginally diagnostic. MR BRAIN: Progression of tumor since the previous study. The clivus is destroyed as is the floor of the sella. The cavernous sinuses bulge mildly, more so on the LEFT, consistent with tumor infiltration. Abnormal enhancement is now seen along the floor of the LEFT middle cranial fossa and tumor likely extends anteriorly to the BILATERAL pterygopalatine fossae. Tumor destruction of the clivus has resulted in dural thickening anterior to the pons continuous with tumor in the upper cervical region. Tumor surrounds the skullbase LEFT internal carotid artery but there is no vascular occlusion. Tumor also surrounds both vertebral arteries, greater on the RIGHT. No acute stroke is evident. No intra-axial brain metastases are seen. Generalized atrophy with chronic microvascular ischemic changes are stable. Chronic LEFT sphenoid sinus disease. No orbital masses. No mastoid fluid. 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 The lesion has grown since the previous study, and measures approximately 62 x 45 x 65 mm (R-L x A-P x C-C). There is destruction of the clivus, as well as anterior arch of C1. Abnormal enhancement can be seen in the ventral epidural space anterior to C1 and  C2. Lateral mass of C1 on the RIGHT is invaded. BILATERAL hypoglossal canals cannot be visualized. Tumor involves the parapharyngeal spaces, retropharyngeal space, and extends superiorly where it is inseparable from the pterygoids.  There may be BILATERAL level II lymph nodes incompletely evaluated. Cervical spondylosis. IMPRESSION: Severely motion degraded study demonstrating progression of squamous cell carcinoma of the nasopharynx, with intracranial disease, most notably in the LEFT middle cranial fossa and BILATERAL LEFT greater than RIGHT cavernous sinuses. Skullbase destruction, with enhancement of the dura in the pre pontine cistern, continuous with epidural tumor extending into the upper cervical region this far caudally as C2. Overall the tumor estimated size is increased, measuring 62 x 45 x 65 mm. Osseous involvement of C1. Bulky nasopharyngeal disease extends into the parapharyngeal, retropharyngeal, and pterygoid masticator spaces. If further delineation desired, and the patient is able to hold still, CT neck with contrast may provide better treatment planning. Electronically Signed   By: Staci Righter M.D.   On: 02/16/2015 20:41   Mr Neck Soft Tissue Only W Wo Contrast  02/16/2015  CLINICAL DATA:  Squamous cell carcinoma of the nasopharynx, with skullbase involvement. Subsequent encounter. EXAM: MRI HEAD WITHOUT AND WITH CONTRAST; MR NECK SOFT TISSUE ONLY WITHOUT AND WITH CONTRAST TECHNIQUE: Multiplanar, multiecho pulse sequences of the brain and surrounding structures were obtained according to standard protocol without and with intravenous contrast; Multiplanar, multisequence MR imaging of the neck was performed both before and after administration of intravenous contrast. CONTRAST:  8 mL COMPARISON:  01/27/2015 MRI brain. FINDINGS: There is considerable motion degradation, despite best efforts of the technologist to emphasize the importance of holding still. The study is marginally diagnostic. MR BRAIN: Progression of tumor since the previous study. The clivus is destroyed as is the floor of the sella. The cavernous sinuses bulge mildly, more so on the LEFT, consistent with tumor infiltration. Abnormal enhancement is now  seen along the floor of the LEFT middle cranial fossa and tumor likely extends anteriorly to the BILATERAL pterygopalatine fossae. Tumor destruction of the clivus has resulted in dural thickening anterior to the pons continuous with tumor in the upper cervical region. Tumor surrounds the skullbase LEFT internal carotid artery but there is no vascular occlusion. Tumor also surrounds both vertebral arteries, greater on the RIGHT. No acute stroke is evident. No intra-axial brain metastases are seen. Generalized atrophy with chronic microvascular ischemic changes are stable. Chronic LEFT sphenoid sinus disease. No orbital masses. No mastoid fluid. 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 The lesion has grown since the previous study, and measures approximately 62 x 45 x 65 mm (R-L x A-P x C-C). There is destruction of the clivus, as well as anterior arch of C1. Abnormal enhancement can be seen in the ventral epidural space anterior to C1 and C2. Lateral mass of C1 on the RIGHT is invaded. BILATERAL hypoglossal canals cannot be visualized. Tumor involves the parapharyngeal spaces, retropharyngeal space, and extends superiorly where it is inseparable from the pterygoids. There may be BILATERAL level II lymph nodes incompletely evaluated. Cervical spondylosis. IMPRESSION: Severely motion degraded study demonstrating progression of squamous cell carcinoma of the nasopharynx, with intracranial disease, most notably in the LEFT middle cranial fossa and BILATERAL LEFT greater than RIGHT cavernous sinuses. Skullbase destruction, with enhancement of the dura in the pre pontine cistern, continuous with epidural tumor extending into the upper cervical region this far caudally as C2. Overall the tumor estimated size is increased, measuring 62 x  45 x 65 mm. Osseous involvement of C1. Bulky nasopharyngeal disease extends into the parapharyngeal, retropharyngeal, and  pterygoid masticator spaces. If further delineation desired, and the patient is able to hold still, CT neck with contrast may provide better treatment planning. Electronically Signed   By: Staci Righter M.D.   On: 02/16/2015 20:41   Dg Chest Port 1 View  02/16/2015  CLINICAL DATA:  Leukocytosis EXAM: PORTABLE CHEST 1 VIEW COMPARISON:  10/07/2013 FINDINGS: Mild hyperinflation. Heart and mediastinal contours are within normal limits. No focal opacities or effusions. No acute bony abnormality. IMPRESSION: Hyperinflation compatible with COPD.  No active disease. Electronically Signed   By: Rolm Baptise M.D.   On: 02/16/2015 15:44   Dg Swallowing Func-speech Pathology  02/17/2015  Objective Swallowing Evaluation:   Patient Details Name: LOA KILIAN MRN: [id number] Date of Birth: 1940/04/03 Today's Date: 02/17/2015 Time: SLP Start Time (ACUTE ONLY): 1320-SLP Stop Time (ACUTE ONLY): 1351 SLP Time Calculation (min) (ACUTE ONLY): 31 min Past Medical History: No past medical history on file. Past Surgical History: Past Surgical History Procedure Laterality Date . S/p bowel obstruction   . Abdominal hysterectomy   . Appendectomy   . Oophorectomy   . Tonsillectomy   . S/p right cea  2/09 . S/p right thyroid nodule biopsy  March 2011   negative . Carotid endarterectomy   . Cardiac catheterization   . Direct laryngoscopy N/A 02/04/2015   Procedure: DIRECT LARYNGOSCOPY WITH BIOPSY OF NASOPHARYNGEAL MASS;  Surgeon: Rozetta Nunnery, MD;  Location: [facility];  Service: ENT;  Laterality: N/A; HPI: 75 yo female [facility] from cancer center with FTT and dehydration.  PMH + for recently diagnosed nasopharyngeal mass, thyroid nodule - right, COPD, GERD, constipation, PVD, shortness of breath, ETOH and tobacco use.  Pt found to have mass involving posterior pharyngeal wall, skull base and CNS.  She reports problems swallowing pills and pain with swallowing.  MBS recommended by this SLP due to pt's clear CN  involvement.  Pt did not recalll having a throat mass per conversation with this SLP.   Subjective: pt awake in chair in flouro suite Assessment / Plan / Recommendation CHL IP CLINICAL IMPRESSIONS 02/17/2015 Therapy Diagnosis Severe cervical esophageal phase dysphagia;Severe pharyngeal phase dysphagia Clinical Impression Pt presents with severe pharyngeal and cervical esophageal dysphagia appearing obstructive in nature (suspect due to mass).  Very poor clearance of barium into esophagus with only liquids provided due to aspiration risk.  Pt requires 9 swallows to clear 90% of moderate sized bolus of nectar.  Pt with subsequent minimal laryngeal penetration and trace aspiration due to residuals from pyriform spilling into open larynx.  Cough did clear aspirates but risk is present.  Pt is high aspiration and malnutrition risk, note palliative referral pending.  Postural modifications considered but pt with pain with minimal neck manipulation and therefore not attempted- suspect secondary to bone mets.  Using live video, educated pt to findings/compensation strategies. Pt appeared confused as to her medical diagnosis and will benefit from reinforcement for compensation strategies.  Recommend continue liquids ONLY with strict aspiration precautions.  Impact on safety and function Risk for inadequate nutrition/hydration;Moderate aspiration risk   CHL IP TREATMENT RECOMMENDATION 02/17/2015 Treatment Recommendations Therapy as outlined in treatment plan below   Prognosis 02/17/2015 Prognosis for Safe Diet Advancement Guarded Barriers to Reach Goals Severity of deficits;Other (Comment) Barriers/Prognosis Comment -- CHL IP DIET RECOMMENDATION 02/17/2015 SLP Diet Recommendations Thin liquid;Nectar thick liquid Liquid Administration via Cup;Straw Medication  Administration Via alternative means Compensations Slow rate;Small sips/bites;Multiple dry swallows after each bite/sip Postural Changes Remain semi-upright after after  feeds/meals (Comment);Seated upright at 90 degrees   CHL IP OTHER RECOMMENDATIONS 02/17/2015 Recommended Consults -- Oral Care Recommendations Oral care BID Other Recommendations Have oral suction available   CHL IP FOLLOW UP RECOMMENDATIONS 02/17/2015 Follow up Recommendations (No Data)   CHL IP FREQUENCY AND DURATION 02/17/2015 Speech Therapy Frequency (ACUTE ONLY) min 1 x/week Treatment Duration 1 week      CHL IP ORAL PHASE 02/17/2015 Oral Phase WFL Oral - Pudding Teaspoon -- Oral - Pudding Cup -- Oral - Honey Teaspoon -- Oral - Honey Cup -- Oral - Nectar Teaspoon -- Oral - Nectar Cup -- Oral - Nectar Straw -- Oral - Thin Teaspoon -- Oral - Thin Cup -- Oral - Thin Straw -- Oral - Puree -- Oral - Mech Soft -- Oral - Regular -- Oral - Multi-Consistency -- Oral - Pill -- Oral Phase - Comment --  CHL IP PHARYNGEAL PHASE 02/17/2015 Pharyngeal Phase Impaired Pharyngeal- Pudding Teaspoon -- Pharyngeal -- Pharyngeal- Pudding Cup -- Pharyngeal -- Pharyngeal- Honey Teaspoon -- Pharyngeal -- Pharyngeal- Honey Cup -- Pharyngeal -- Pharyngeal- Nectar Teaspoon Pharyngeal residue - pyriform;Pharyngeal residue - valleculae;Pharyngeal residue - cp segment Pharyngeal -- Pharyngeal- Nectar Cup Pharyngeal residue - valleculae;Pharyngeal residue - pyriform;Pharyngeal residue - cp segment;Trace aspiration Pharyngeal -- Pharyngeal- Nectar Straw -- Pharyngeal -- Pharyngeal- Thin Teaspoon Pharyngeal residue - valleculae;Pharyngeal residue - pyriform;Pharyngeal residue - cp segment Pharyngeal -- Pharyngeal- Thin Cup Pharyngeal residue - valleculae;Pharyngeal residue - pyriform;Pharyngeal residue - cp segment Pharyngeal -- Pharyngeal- Thin Straw -- Pharyngeal -- Pharyngeal- Puree -- Pharyngeal -- Pharyngeal- Mechanical Soft -- Pharyngeal -- Pharyngeal- Regular -- Pharyngeal -- Pharyngeal- Multi-consistency -- Pharyngeal -- Pharyngeal- Pill -- Pharyngeal -- Pharyngeal Comment pt does not sense residuals in pharynx/cervical esophagus that appear  to mix with secretions; pt required up to 9 swallows to clear very small amount of liquids, penetration of nectar noted with trace aspiration due to pyriform sinus residuals spilling into larynx  CHL IP CERVICAL ESOPHAGEAL PHASE 02/17/2015 Cervical Esophageal Phase Impaired Pudding Teaspoon -- Pudding Cup -- Honey Teaspoon -- Honey Cup -- Nectar Teaspoon -- Nectar Cup -- Nectar Straw -- Thin Teaspoon -- Thin Cup -- Thin Straw -- Puree -- Mechanical Soft -- Regular -- Multi-consistency -- Pill -- Cervical Esophageal Comment very poor clearance into esophagus with pt requiring MULTIPLE swallows across all consistencies - poor sensation to gross residuals Luanna Salk, MS [facility] SLP [phone]               Assessment & Plan:  Nasopharyngeal cancer MRI showed disease progression She is unlikely able to tolerate even palliative radiation to a meaningful degree. Both the patient and family are in full agreement to transition her care to comfort care only  Failure to thrive Protein calorie malnutrition Dysphagia She is aware of risks of aspiration and inability to maintain adequate nutrition with artificial feeding PLan to allow patient regular food as tolerated for pleasure and comfort  Aspiration pneumonia OK to stop antibiotics upon discharge  Severe cancer pain Doing well on IV pain medications Appreciate assistance from Palliative care service/hospital team for pain management  Code Status Agree for DNR/DNI  Discharge planning and prognosis I estimate survival measured in approximately 2 weeks without aggressive measures PLan to discharge to [facility] all questions   [facility], [place], MD 02/18/2015  3:15 PM

## 2015-02-18 NOTE — Progress Notes (Signed)
Per [facility] liason, family would like [employer]. Per Attending pt would qualify now as she has not eaten in several days and per MRI on 02/16/14 CA has progressed. [facility] liason to contact [employer]. CSW made aware of new plan. CM will continue to follow. Marney Doctor RN,BSN,NCM [phone]

## 2015-02-18 NOTE — Progress Notes (Signed)
[facility] PROGRESS NOTE    Progress Note   [name] UUV:[id number] DOB: 11-Nov-1940 DOA: 02/16/2015 PCP: Cathlean Cower, MD   Brief Narrative:   [name] is an 75 y.o. female past medical history illness of pharyngeal carcinoma on the Dr. course which care who presents to the [facility] coughing up his meds and difficulty swallowing.  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 We'll continue lidocaine solution and Roxanol for pain. Can change to liquid Augmentin and a.m. Family would like to move towards comfort care. They refuse radiation has they relate they would not like to put her grandmother through the psychological impact. Has had only and ensure in the last 48 hours. I am concerned that her decline might be progressing more rapidly than thought  Protein-calorie malnutrition, severe ([place])  Cancer associated pain Continue narcotics lidocaine solutions for pain. He relates her pain is controlled.  Hypokalemia b-met pending.  Hypercalcemia Likely due to dehydration resolved with IV fluid hydration.  Metabolic alkalosis: Likely contraction alkalosis continue IV fluids strict I's and O's.   Essential hypertension, benign: Continue atenolol.    DVT Prophylaxis - Lovenox ordered.  Family Communication: Grandaughters Disposition Plan: Home 3-4 days Code Status:     Code Status Orders        Start     Ordered   02/17/15 0857  Do not attempt resuscitation (DNR)   Continuous    Question Answer Comment  In the event of cardiac or respiratory ARREST Do not call a "code blue"   In the event of cardiac or respiratory ARREST Do not perform Intubation, CPR, defibrillation or ACLS   In the event of cardiac or respiratory ARREST Use medication by any route,  position, wound care, and other measures to relive pain and suffering. May use oxygen, suction and manual treatment of airway obstruction as needed for comfort.      02/17/15 0856    Code Status History    Date Active Date Inactive Code Status Order ID Comments User Context   02/16/2015  1:52 PM 02/17/2015  8:56 AM Full Code [id number]  Robbie Lis, MD Inpatient   04/22/2013  8:48 AM 04/23/2013  7:34 PM Full Code [id number]  Caren Griffins, MD ED   12/05/2012  6:30 AM 12/07/2012  7:30 PM Full Code [id number]  Phillips Grout, MD ED   07/24/2012 11:50 PM 07/27/2012  3:48 PM Full Code [id number]  Orvan Falconer, MD ED    Advance Directive Documentation        Most Recent Value   Type of Advance Directive  Living will, Healthcare Power of Attorney   Pre-existing out of facility DNR order (yellow form or pink MOST form)     "MOST" Form in Place?          IV Access:    Peripheral IV   Procedures and diagnostic studies:   Mr Jeri Cos Wo Contrast  02/16/2015  CLINICAL DATA:  Squamous cell carcinoma of the nasopharynx, with skullbase involvement. Subsequent encounter. EXAM: MRI HEAD WITHOUT AND WITH CONTRAST; MR NECK SOFT TISSUE ONLY WITHOUT AND WITH CONTRAST TECHNIQUE: Multiplanar, multiecho pulse sequences of the brain and surrounding structures were obtained according to standard protocol without and with intravenous contrast; Multiplanar, multisequence MR imaging of the neck was performed both before and  after administration of intravenous contrast. CONTRAST:  8 mL COMPARISON:  01/27/2015 MRI brain. FINDINGS: There is considerable motion degradation, despite best efforts of the technologist to emphasize the importance of holding still. The study is marginally diagnostic. MR BRAIN: Progression of tumor since the previous study. The clivus is destroyed as is the floor of the sella. The cavernous sinuses bulge mildly, more so on the LEFT, consistent with tumor infiltration. Abnormal enhancement is now seen  along the floor of the LEFT middle cranial fossa and tumor likely extends anteriorly to the BILATERAL pterygopalatine fossae. Tumor destruction of the clivus has resulted in dural thickening anterior to the pons continuous with tumor in the upper cervical region. Tumor surrounds the skullbase LEFT internal carotid artery but there is no vascular occlusion. Tumor also surrounds both vertebral arteries, greater on the RIGHT. No acute stroke is evident. No intra-axial brain metastases are seen. Generalized atrophy with chronic microvascular ischemic changes are stable. Chronic LEFT sphenoid sinus disease. No orbital masses. No mastoid fluid. 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 The lesion has grown since the previous study, and measures approximately 62 x 45 x 65 mm (R-L x A-P x C-C). There is destruction of the clivus, as well as anterior arch of C1. Abnormal enhancement can be seen in the ventral epidural space anterior to C1 and C2. Lateral mass of C1 on the RIGHT is invaded. BILATERAL hypoglossal canals cannot be visualized. Tumor involves the parapharyngeal spaces, retropharyngeal space, and extends superiorly where it is inseparable from the pterygoids. There may be BILATERAL level II lymph nodes incompletely evaluated. Cervical spondylosis. IMPRESSION: Severely motion degraded study demonstrating progression of squamous cell carcinoma of the nasopharynx, with intracranial disease, most notably in the LEFT middle cranial fossa and BILATERAL LEFT greater than RIGHT cavernous sinuses. Skullbase destruction, with enhancement of the dura in the pre pontine cistern, continuous with epidural tumor extending into the upper cervical region this far caudally as C2. Overall the tumor estimated size is increased, measuring 62 x 45 x 65 mm. Osseous involvement of C1. Bulky nasopharyngeal disease extends into the parapharyngeal, retropharyngeal, and pterygoid  masticator spaces. If further delineation desired, and the patient is able to hold still, CT neck with contrast may provide better treatment planning. Electronically Signed   By: Staci Righter M.D.   On: 02/16/2015 20:41   Mr Neck Soft Tissue Only W Wo Contrast  02/16/2015  CLINICAL DATA:  Squamous cell carcinoma of the nasopharynx, with skullbase involvement. Subsequent encounter. EXAM: MRI HEAD WITHOUT AND WITH CONTRAST; MR NECK SOFT TISSUE ONLY WITHOUT AND WITH CONTRAST TECHNIQUE: Multiplanar, multiecho pulse sequences of the brain and surrounding structures were obtained according to standard protocol without and with intravenous contrast; Multiplanar, multisequence MR imaging of the neck was performed both before and after administration of intravenous contrast. CONTRAST:  8 mL COMPARISON:  01/27/2015 MRI brain. FINDINGS: There is considerable motion degradation, despite best efforts of the technologist to emphasize the importance of holding still. The study is marginally diagnostic. MR BRAIN: Progression of tumor since the previous study. The clivus is destroyed as is the floor of the sella. The cavernous sinuses bulge mildly, more so on the LEFT, consistent with tumor infiltration. Abnormal enhancement is now seen along the floor of the LEFT middle cranial fossa and tumor likely extends anteriorly to the BILATERAL pterygopalatine fossae. Tumor destruction of the clivus has resulted in dural thickening anterior to the pons continuous with tumor in the  upper cervical region. Tumor surrounds the skullbase LEFT internal carotid artery but there is no vascular occlusion. Tumor also surrounds both vertebral arteries, greater on the RIGHT. No acute stroke is evident. No intra-axial brain metastases are seen. Generalized atrophy with chronic microvascular ischemic changes are stable. Chronic LEFT sphenoid sinus disease. No orbital masses. No mastoid fluid. MR NECK:Large squamous cell carcinoma of the nasopharynx  with skull base invasion is redemonstrated. The lesion has grown since the previous study, and measures approximately 62 x 45 x 65 mm (R-L x A-P x C-C). There is destruction of the clivus, as well as anterior arch of C1. Abnormal enhancement can be seen in the ventral epidural space anterior to C1 and C2. Lateral mass of C1 on the RIGHT is invaded. BILATERAL hypoglossal canals cannot be visualized. Tumor involves the parapharyngeal spaces, retropharyngeal space, and extends superiorly where it is inseparable from the pterygoids. There may be BILATERAL level II lymph nodes incompletely evaluated. Cervical spondylosis. IMPRESSION: Severely motion degraded study demonstrating progression of squamous cell carcinoma of the nasopharynx, with intracranial disease, most notably in the LEFT middle cranial fossa and BILATERAL LEFT greater than RIGHT cavernous sinuses. Skullbase destruction, with enhancement of the dura in the pre pontine cistern, continuous with epidural tumor extending into the upper cervical region this far caudally as C2. Overall the tumor estimated size is increased, measuring 62 x 45 x 65 mm. Osseous involvement of C1. Bulky nasopharyngeal disease extends into the parapharyngeal, retropharyngeal, and pterygoid masticator spaces. If further delineation desired, and the patient is able to hold still, CT neck with contrast may provide better treatment planning. Electronically Signed   By: Staci Righter M.D.   On: 02/16/2015 20:41   Dg Chest Port 1 View  02/16/2015  CLINICAL DATA:  Leukocytosis EXAM: PORTABLE CHEST 1 VIEW COMPARISON:  10/07/2013 FINDINGS: Mild hyperinflation. Heart and mediastinal contours are within normal limits. No focal opacities or effusions. No acute bony abnormality. IMPRESSION: Hyperinflation compatible with COPD.  No active disease. Electronically Signed   By: Rolm Baptise M.D.   On: 02/16/2015 15:44   Dg Swallowing Func-speech Pathology  02/17/2015  Objective Swallowing  Evaluation:   Patient Details Name: [name] MRN: [id number] Date of Birth: 1940/07/20 Today's Date: 02/17/2015 Time: SLP Start Time (ACUTE ONLY): 1320-SLP Stop Time (ACUTE ONLY): 1351 SLP Time Calculation (min) (ACUTE ONLY): 31 min Past Medical History: No past medical history on file. Past Surgical History: Past Surgical History Procedure Laterality Date . S/p bowel obstruction   . Abdominal hysterectomy   . Appendectomy   . Oophorectomy   . Tonsillectomy   . S/p right cea  2/09 . S/p right thyroid nodule biopsy  March 2011   negative . Carotid endarterectomy   . Cardiac catheterization   . Direct laryngoscopy N/A 02/04/2015   Procedure: DIRECT LARYNGOSCOPY WITH BIOPSY OF NASOPHARYNGEAL MASS;  Surgeon: Rozetta Nunnery, MD;  Location: [place];  Service: ENT;  Laterality: N/A; HPI: 75 yo female adm [facility] from cancer center with FTT and dehydration.  PMH + for recently diagnosed nasopharyngeal mass, thyroid nodule - right, COPD, GERD, constipation, PVD, shortness of breath, ETOH and tobacco use.  Pt found to have mass involving posterior pharyngeal wall, skull base and CNS.  She reports problems swallowing pills and pain with swallowing.  MBS recommended by this SLP due to pt's clear CN involvement.  Pt did not recalll having a throat mass per conversation with this SLP.   Subjective: pt  awake in chair in flouro suite Assessment / Plan / Recommendation CHL IP CLINICAL IMPRESSIONS 02/17/2015 Therapy Diagnosis Severe cervical esophageal phase dysphagia;Severe pharyngeal phase dysphagia Clinical Impression Pt presents with severe pharyngeal and cervical esophageal dysphagia appearing obstructive in nature (suspect due to mass).  Very poor clearance of barium into esophagus with only liquids provided due to aspiration risk.  Pt requires 9 swallows to clear 90% of moderate sized bolus of nectar.  Pt with subsequent minimal laryngeal penetration and trace aspiration due to residuals from pyriform  spilling into open larynx.  Cough did clear aspirates but risk is present.  Pt is high aspiration and malnutrition risk, note palliative referral pending.  Postural modifications considered but pt with pain with minimal neck manipulation and therefore not attempted- suspect secondary to bone mets.  Using live video, educated pt to findings/compensation strategies. Pt appeared confused as to her medical diagnosis and will benefit from reinforcement for compensation strategies.  Recommend continue liquids ONLY with strict aspiration precautions.  Impact on safety and function Risk for inadequate nutrition/hydration;Moderate aspiration risk   CHL IP TREATMENT RECOMMENDATION 02/17/2015 Treatment Recommendations Therapy as outlined in treatment plan below   Prognosis 02/17/2015 Prognosis for Safe Diet Advancement Guarded Barriers to Reach Goals Severity of deficits;Other (Comment) Barriers/Prognosis Comment -- CHL IP DIET RECOMMENDATION 02/17/2015 SLP Diet Recommendations Thin liquid;Nectar thick liquid Liquid Administration via Cup;Straw Medication Administration Via alternative means Compensations Slow rate;Small sips/bites;Multiple dry swallows after each bite/sip Postural Changes Remain semi-upright after after feeds/meals (Comment);Seated upright at 90 degrees   CHL IP OTHER RECOMMENDATIONS 02/17/2015 Recommended Consults -- Oral Care Recommendations Oral care BID Other Recommendations Have oral suction available   CHL IP FOLLOW UP RECOMMENDATIONS 02/17/2015 Follow up Recommendations (No Data)   CHL IP FREQUENCY AND DURATION 02/17/2015 Speech Therapy Frequency (ACUTE ONLY) min 1 x/week Treatment Duration 1 week      CHL IP ORAL PHASE 02/17/2015 Oral Phase WFL Oral - Pudding Teaspoon -- Oral - Pudding Cup -- Oral - Honey Teaspoon -- Oral - Honey Cup -- Oral - Nectar Teaspoon -- Oral - Nectar Cup -- Oral - Nectar Straw -- Oral - Thin Teaspoon -- Oral - Thin Cup -- Oral - Thin Straw -- Oral - Puree -- Oral - Mech Soft -- Oral  - Regular -- Oral - Multi-Consistency -- Oral - Pill -- Oral Phase - Comment --  CHL IP PHARYNGEAL PHASE 02/17/2015 Pharyngeal Phase Impaired Pharyngeal- Pudding Teaspoon -- Pharyngeal -- Pharyngeal- Pudding Cup -- Pharyngeal -- Pharyngeal- Honey Teaspoon -- Pharyngeal -- Pharyngeal- Honey Cup -- Pharyngeal -- Pharyngeal- Nectar Teaspoon Pharyngeal residue - pyriform;Pharyngeal residue - valleculae;Pharyngeal residue - cp segment Pharyngeal -- Pharyngeal- Nectar Cup Pharyngeal residue - valleculae;Pharyngeal residue - pyriform;Pharyngeal residue - cp segment;Trace aspiration Pharyngeal -- Pharyngeal- Nectar Straw -- Pharyngeal -- Pharyngeal- Thin Teaspoon Pharyngeal residue - valleculae;Pharyngeal residue - pyriform;Pharyngeal residue - cp segment Pharyngeal -- Pharyngeal- Thin Cup Pharyngeal residue - valleculae;Pharyngeal residue - pyriform;Pharyngeal residue - cp segment Pharyngeal -- Pharyngeal- Thin Straw -- Pharyngeal -- Pharyngeal- Puree -- Pharyngeal -- Pharyngeal- Mechanical Soft -- Pharyngeal -- Pharyngeal- Regular -- Pharyngeal -- Pharyngeal- Multi-consistency -- Pharyngeal -- Pharyngeal- Pill -- Pharyngeal -- Pharyngeal Comment pt does not sense residuals in pharynx/cervical esophagus that appear to mix with secretions; pt required up to 9 swallows to clear very small amount of liquids, penetration of nectar noted with trace aspiration due to pyriform sinus residuals spilling into larynx  CHL IP CERVICAL ESOPHAGEAL PHASE 02/17/2015 Cervical Esophageal Phase Impaired Pudding Teaspoon --  Pudding Cup -- Honey Teaspoon -- Honey Cup -- Nectar Teaspoon -- Nectar Cup -- Nectar Straw -- Thin Teaspoon -- Thin Cup -- Thin Straw -- Puree -- Mechanical Soft -- Regular -- Multi-consistency -- Pill -- Cervical Esophageal Comment very poor clearance into esophagus with pt requiring MULTIPLE swallows across all consistencies - poor sensation to gross residuals Luanna Salk, MS [place] [phone]                Medical  Consultants:    None.  Anti-Infectives:   Anti-infectives    Start     Dose/Rate Route Frequency Ordered Stop   02/17/15 1200  ampicillin-sulbactam (UNASYN) 1.5 g in sodium chloride 0.9 % 50 mL IVPB     1.5 g 100 mL/hr over 30 Minutes Intravenous Every 6 hours 02/17/15 1017     02/16/15 1530  piperacillin-tazobactam (ZOSYN) IVPB 3.375 g  Status:  Discontinued     3.375 g 12.5 mL/hr over 240 Minutes Intravenous Every 8 hours 02/16/15 1511 02/17/15 1017      Subjective:    [name] pain  well controlled. Had only a small amount of ensure.  Objective:    Filed Vitals:   02/17/15 0428 02/17/15 1210 02/17/15 2101 02/18/15 0537  BP: 154/63 143/68 142/65 158/90  Pulse: 60 73 66 73  Temp: 98 F (36.7 C) 98.6 F (37 C) 98.4 F (36.9 C) 97.6 F (36.4 C)  TempSrc: Oral Oral Oral Oral  Resp: '16 18 16   ' Height:      Weight: 41.7 kg (91 lb 14.9 oz)   42 kg (92 lb 9.5 oz)  SpO2: 95% 98% 95% 97%   No intake or output data in the 24 hours ending 02/18/15 1150 Filed Weights   02/16/15 1306 02/17/15 0428 02/18/15 0537  Weight: 41.2 kg (90 lb 13.3 oz) 41.7 kg (91 lb 14.9 oz) 42 kg (92 lb 9.5 oz)    Exam: Gen:  NAD, cachectic Cardiovascular:  RRR. Chest and lungs:   CTAB Abdomen:  Abdomen soft, NT/ND, + BS Extremities:  No C/E/C   Data Reviewed:    Labs: Basic Metabolic Panel:  Recent Labs Lab 02/16/15 1133 02/16/15 1450 02/17/15 0420  NA 138 144 138  K 2.7 Repeated and Verified* 3.5 2.9*  CL  --  94* 93*  CO2 33* 38* 34*  GLUCOSE 191* 128* 116*  BUN 18.'7 18 14  ' CREATININE 0.9 0.81 0.64  CALCIUM 10.6* 10.0 9.0  MG 2.3 1.8  --   PHOS  --  3.0  --    GFR Estimated Creatinine Clearance: 41.5 mL/min (by C-G formula based on Cr of 0.64). Liver Function Tests:  Recent Labs Lab 02/16/15 1133 02/16/15 1450 02/17/15 0420  AST '16 19 19  ' ALT 12 13* 14  ALKPHOS 110 92 89  BILITOT 0.66 0.5 1.0  PROT 8.3 7.9 6.8  ALBUMIN 3.1* 3.4* 3.0*   No results for  input(s): LIPASE, AMYLASE in the last 168 hours. No results for input(s): AMMONIA in the last 168 hours. Coagulation profile  Recent Labs Lab 02/16/15 1450  INR 1.10    CBC:  Recent Labs Lab 02/16/15 1133 02/16/15 1450 02/17/15 0420  WBC 13.8* 12.5* 12.9*  NEUTROABS 12.2* 10.2*  --   HGB 14.0 13.3 12.3  HCT 43.2 42.1 38.8  MCV 84.0 88.4 86.4  PLT 248 239 248   Cardiac Enzymes: No results for input(s): CKTOTAL, CKMB, CKMBINDEX, TROPONINI in the last 168 hours. BNP (last 3 results)  No results for input(s): PROBNP in the last 8760 hours. CBG:  Recent Labs Lab 02/14/15 1421 02/17/15 0750 02/18/15 0755  GLUCAP 122* 119* 88   D-Dimer: No results for input(s): DDIMER in the last 72 hours. Hgb A1c: No results for input(s): HGBA1C in the last 72 hours. Lipid Profile: No results for input(s): CHOL, HDL, LDLCALC, TRIG, CHOLHDL, LDLDIRECT in the last 72 hours. Thyroid function studies: No results for input(s): TSH, T4TOTAL, T3FREE, THYROIDAB in the last 72 hours.  Invalid input(s): FREET3 Anemia work up: No results for input(s): VITAMINB12, FOLATE, FERRITIN, TIBC, IRON, RETICCTPCT in the last 72 hours. Sepsis Labs:  Recent Labs Lab 02/16/15 1133 02/16/15 1450 02/17/15 0420  WBC 13.8* 12.5* 12.9*   Microbiology No results found for this or any previous visit (from the past 240 hour(s)).   Medications:   . amLODipine  10 mg Oral Daily  . ampicillin-sulbactam (UNASYN) IV  1.5 g Intravenous Q6H  . aspirin EC  81 mg Oral Daily  . atenolol  50 mg Oral Daily  . atorvastatin  20 mg Oral q1800  . chlorhexidine  15 mL Mouth/Throat BID  . feeding supplement (ENSURE ENLIVE)  237 mL Oral TID BM  . lidocaine  15 mL Mouth/Throat TID AC & HS  . nicotine  14 mg Transdermal Daily  . potassium chloride  40 mEq Oral TID  . senna-docusate  1 tablet Oral BID   Continuous Infusions: . sodium chloride 75 mL/hr at 02/16/15 1409    Time spent: 35 min   LOS: 2 days    Charlynne Cousins  [facility] Pager [phone]  *Please refer to [email or web address], password [email or web address] to get updated schedule on who will round on this patient, as hospitalists switch teams weekly. If 7PM-7AM, please contact night-coverage at [email or web address], password [email or web address] for any overnight needs.  02/18/2015, 11:50 AM

## 2015-02-18 NOTE — Consult Note (Signed)
[name]:  Received request for family interest in [facility] [facility] vs home with hospice. Chart reviewed and received report from Palliative Medicine Team and Dr. Alvy Bimler. Met with patient and g-dtrs. All confirmed interest in [facility]. Patient is eligible per [facility] physician review. Plan to follow up with family tomorrow to determine transfer date. Family has my contact information. Will update MD and CSW tomorrow.   Thank you. Erling Conte, [place]

## 2015-02-18 NOTE — Progress Notes (Signed)
Daily Progress Note   Patient Name: [name]       Date: 02/18/2015 DOB: April 29, 1940  Age: 75 y.o. MRN#: [id number] Attending Physician: Charlynne Cousins, MD Primary Care Physician: Cathlean Cower, MD Admit Date: 02/16/2015  Reason for Consultation/Follow-up: Inpatient hospice referral, Pain control and Terminal Care  Subjective: Patient states she is having pain in her left neck.  Asks to be repositioned.  Interval Events: Patient has declined.  Eating very little.  Primary team recommending residential hospice.  Per Harmon Pier from [facility] - she will likely discharge to [facility] in the next 24 - 48 hours.  Repositioned patient.  Adjusted pain meds.  Will add lidocaine patch as well (Trial).   Gave grand daughters my contact information.  Length of Stay: 2 days  Current Medications: Scheduled Meds:  . amLODipine  10 mg Oral Daily  . ampicillin-sulbactam (UNASYN) IV  1.5 g Intravenous Q6H  . aspirin EC  81 mg Oral Daily  . atenolol  50 mg Oral Daily  . chlorhexidine  15 mL Mouth/Throat BID  . feeding supplement (ENSURE ENLIVE)  237 mL Oral TID BM  . lidocaine  1 patch Transdermal Q24H  . lidocaine  15 mL Mouth/Throat TID AC & HS  . nicotine  14 mg Transdermal Daily  . senna-docusate  1 tablet Oral BID    Continuous Infusions: . sodium chloride 75 mL/hr at 02/16/15 1409    PRN Meds: acetaminophen **OR** acetaminophen, lip balm, morphine CONCENTRATE, ondansetron **OR** ondansetron (ZOFRAN) IV, phenol  Physical Exam             Very pleasant, frail, cachectic female in mild distress.  Grand dtrs at bedside. Firmness in neck that is tender to palpation. CV:  S1, S2  Reg rate Resp:  No increased work of breathing Abdomen:  Thin, soft, nd. Extremities:  No edema.    Vital  Signs: BP 121/55 mmHg  Pulse 69  Temp(Src) 98.8 F (37.1 C) (Oral)  Resp 18  Ht 5\' 3"  (1.6 m)  Wt 42 kg (92 lb 9.5 oz)  BMI 16.41 kg/m2  SpO2 98% SpO2: SpO2: 98 % O2 Device: O2 Device: Not Delivered O2 Flow Rate:    Intake/output summary: No intake or output data in the 24 hours ending 02/18/15 1507 LBM: Last BM Date: 02/15/15  Baseline Weight: Weight: 41.2 kg (90 lb 13.3 oz) Most recent weight: Weight: 42 kg (92 lb 9.5 oz)       Palliative Assessment/Data: Flowsheet Rows        Most Recent Value   Intake Tab    Referral Department  Oncology   Unit at Time of Referral  Med/Surg Unit   Palliative Care Primary Diagnosis  Cancer   Date Notified  02/17/15   Palliative Care Type  New Palliative care   Reason for referral  Pain, Clarify Goals of Care, Counsel Regarding Hospice   Date of Admission  02/16/15   Date first seen by Palliative Care  02/17/15   # of days Palliative referral response time  0 Day(s)   # of days IP prior to Palliative referral  1   Clinical Assessment    Palliative Performance Scale Score  40%   Pain Max last 24 hours  7   Pain Min Last 24 hours  2   Psychosocial & Spiritual Assessment    Social Work Plan of Care  Education on Hospice, Clarified patient/family wishes with healthcare team   Palliative Care Outcomes    Patient/Family meeting held?  Yes   Palliative Care Outcomes  Improved pain interventions, Clarified goals of care   Patient/Family wishes: Interventions discontinued/not started   Mechanical Ventilation, PEG   Palliative Care follow-up planned  Yes, Facility      Additional Data Reviewed: CBC    Component Value Date/Time   WBC 12.9* 02/17/2015 0420   WBC 13.8* 02/16/2015 1133   RBC 4.49 02/17/2015 0420   RBC 5.14 02/16/2015 1133   HGB 12.3 02/17/2015 0420   HGB 14.0 02/16/2015 1133   HCT 38.8 02/17/2015 0420   HCT 43.2 02/16/2015 1133   PLT 248 02/17/2015 0420   PLT 248 02/16/2015 1133   MCV 86.4 02/17/2015 0420   MCV 84.0  02/16/2015 1133   MCH 27.4 02/17/2015 0420   MCH 27.2 02/16/2015 1133   MCHC 31.7 02/17/2015 0420   MCHC 32.4 02/16/2015 1133   RDW 14.9 02/17/2015 0420   RDW 15.2* 02/16/2015 1133   LYMPHSABS 0.8 02/16/2015 1450   LYMPHSABS 0.4* 02/16/2015 1133   MONOABS 1.5* 02/16/2015 1450   MONOABS 1.1* 02/16/2015 1133   EOSABS 0.0 02/16/2015 1450   EOSABS 0.0 02/16/2015 1133   BASOSABS 0.0 02/16/2015 1450   BASOSABS 0.0 02/16/2015 1133    CMP     Component Value Date/Time   NA 138 02/18/2015 1224   NA 138 02/16/2015 1133   K 3.8 02/18/2015 1224   K 2.7 Repeated and Verified* 02/16/2015 1133   CL 97* 02/18/2015 1224   CO2 33* 02/18/2015 1224   CO2 33* 02/16/2015 1133   GLUCOSE 175* 02/18/2015 1224   GLUCOSE 191* 02/16/2015 1133   BUN 12 02/18/2015 1224   BUN 18.7 02/16/2015 1133   CREATININE 0.55 02/18/2015 1224   CREATININE 0.9 02/16/2015 1133   CALCIUM 8.9 02/18/2015 1224   CALCIUM 10.6* 02/16/2015 1133   PROT 6.8 02/17/2015 0420   PROT 8.3 02/16/2015 1133   ALBUMIN 3.0* 02/17/2015 0420   ALBUMIN 3.1* 02/16/2015 1133   AST 19 02/17/2015 0420   AST 16 02/16/2015 1133   ALT 14 02/17/2015 0420   ALT 12 02/16/2015 1133   ALKPHOS 89 02/17/2015 0420   ALKPHOS 110 02/16/2015 1133   BILITOT 1.0 02/17/2015 0420   BILITOT 0.66 02/16/2015 1133   GFRNONAA >60 02/18/2015 1224   GFRAA >60 02/18/2015 1224  Problem List:  Patient Active Problem List   Diagnosis Date Noted  . Chronic periodontitis 02/17/2015  . Palliative care encounter   . DNR (do not resuscitate)   . 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 Cancer associated pain 02/16/2015  . FTT (failure to thrive) in adult 02/16/2015  . Hypokalemia 02/16/2015  . Leukocytosis 02/16/2015  . Hypercalcemia 02/16/2015  . Essential hypertension, benign 02/16/2015  . Dyslipidemia 02/16/2015  . At risk for aspiration 02/16/2015  . Protein-calorie malnutrition, severe  ([place]) 12/06/2012     Palliative Care Assessment & Plan    1.Code Status:  DNR    Code Status Orders        Start     Ordered   02/17/15 0857  Do not attempt resuscitation (DNR)   Continuous    Question Answer Comment  In the event of cardiac or respiratory ARREST Do not call a "code blue"   In the event of cardiac or respiratory ARREST Do not perform Intubation, CPR, defibrillation or ACLS   In the event of cardiac or respiratory ARREST Use medication by any route, position, wound care, and other measures to relive pain and suffering. May use oxygen, suction and manual treatment of airway obstruction as needed for comfort.      02/17/15 0856    Code Status History    Date Active Date Inactive Code Status Order ID Comments User Context   02/16/2015  1:52 PM 02/17/2015  8:56 AM Full Code [id number]  Robbie Lis, MD Inpatient   04/22/2013  8:48 AM 04/23/2013  7:34 PM Full Code [id number]  Caren Griffins, MD ED   12/05/2012  6:30 AM 12/07/2012  7:30 PM Full Code [id number]  Phillips Grout, MD ED   07/24/2012 11:50 PM 07/27/2012  3:48 PM Full Code [id number]  Orvan Falconer, MD ED    Advance Directive Documentation        Most Recent Value   Type of Advance Directive  Living will, Healthcare Power of Attorney   Pre-existing out of facility DNR order (yellow form or pink MOST form)     "MOST" Form in Place?         2. Goals of Care/Additional Recommendations:  DNR / DNI  D/C to residential hospice when bed is available.  Trial lidocaine patch on neck   Comfort feeds.  3. Symptom Management:  Continue morphine 5-10 mg liquid solution q2 prn  Lidocaine 5% patch to neck   Reduced oral medications, frequency of vital signs  Senna for constipation  4. Palliative Prophylaxis:   Aspiration, Bowel Regimen and Frequent Pain Assessment  5. Prognosis: < 2 weeks  6. Discharge Planning:  Hospice facility   Care plan was discussed with grand dtrs at bedside and RN  Thank you for  allowing the Palliative Medicine Team to assist in the care of this patient.   Time In: [id number] Time Out: 1515 Total Time 30 Prolonged Time Billed  no        Imogene Burn, [place] [facility] Pager: [phone]  02/18/2015, 3:07 PM  Please contact Palliative Medicine Team phone at [phone] for questions and concerns.

## 2015-02-18 NOTE — Clinical Social Work Note (Signed)
CSW received a phone called RN CM stating pt will go to residential hospice, not home with hospice. CSW spoke to Harmon Pier at [facility] who confirmed she's already completed paperwork with the pt and pt will have a bed upon discharge; either Saturday or Sunday. Weekend CSW aware.    Cindra Presume, LCSW [phone] Hospital psychiatric & 5E, 5W [phone] Licensed Clinical Social Worker

## 2015-02-19 LAB — URINALYSIS, ROUTINE W REFLEX MICROSCOPIC
Bilirubin Urine: NEGATIVE
Glucose, UA: NEGATIVE mg/dL
HGB URINE DIPSTICK: NEGATIVE
KETONES UR: NEGATIVE mg/dL
Nitrite: NEGATIVE
PROTEIN: NEGATIVE mg/dL
Specific Gravity, Urine: 1.017 (ref 1.005–1.030)
pH: 5.5 (ref 5.0–8.0)

## 2015-02-19 LAB — URINE MICROSCOPIC-ADD ON: RBC / HPF: NONE SEEN RBC/hpf (ref 0–5)

## 2015-02-19 LAB — GLUCOSE, CAPILLARY
GLUCOSE-CAPILLARY: 134 mg/dL — AB (ref 65–99)
Glucose-Capillary: 145 mg/dL — ABNORMAL HIGH (ref 65–99)

## 2015-02-19 MED ORDER — AMOXICILLIN-POT CLAVULANATE 600-42.9 MG/5ML PO SUSR
875.0000 mg | Freq: Two times a day (BID) | ORAL | Status: DC
  Administered 2015-02-19 – 2015-02-20 (×2): 875 mg via ORAL
  Filled 2015-02-19 (×7): qty 7.3

## 2015-02-19 NOTE — Progress Notes (Addendum)
[facility] PROGRESS NOTE    Progress Note   [name] [id number] DOB: December 14, 1940 DOA: 02/16/2015 PCP: Cathlean Cower, MD   Brief Narrative:   [name] is an 75 y.o. female past medical history illness of pharyngeal carcinoma on the Dr. course which care who presents to the [facility] coughing up his meds and difficulty swallowing.  Assessment/Plan:   At risk for aspiration pneumonia/carcinoma of the posterior wall of the nasopharynx: MBS done pathology recommended thin liquid diet. Afebrile no leukocytosis. She cont to improve. Patient only tolerating ensure about 1/2 cup The patient was made a DNR/DNI  We'll continue lidocaine solution and Roxanol for pain. Can change to liquid Augmentin tonight watch 24 hrs. Family would like to move towards comfort care.  Protein-calorie malnutrition, severe (HCC)  Cancer associated pain: Continue narcotics lidocaine solutions for pain. He relates her pain is controlled.  Hypokalemia Resolved.  Hypercalcemia Resolved with hydration.  Metabolic alkalosis: Contraction alkalosis, resolved.   Essential hypertension, benign: Continue atenolol.    DVT Prophylaxis - Lovenox ordered.  Family Communication: Grandaughters Disposition Plan: Home 3-4 days Code Status:     Code Status Orders        Start     Ordered   02/17/15 0857  Do not attempt resuscitation (DNR)   Continuous    Question Answer Comment  In the event of cardiac or respiratory ARREST Do not call a "code blue"   In the event of cardiac or respiratory ARREST Do not perform Intubation, CPR, defibrillation or ACLS   In the event of cardiac or respiratory ARREST Use medication by any route, position, wound care, and other measures to relive pain and suffering. May use oxygen, suction and manual treatment of airway obstruction as needed for comfort.      02/17/15 0856    Code Status History    Date Active Date Inactive Code Status Order ID Comments  User Context   02/16/2015  1:52 PM 02/17/2015  8:56 AM Full Code [id number]  Robbie Lis, MD Inpatient   04/22/2013  8:48 AM 04/23/2013  7:34 PM Full Code [id number]  Caren Griffins, MD ED   12/05/2012  6:30 AM 12/07/2012  7:30 PM Full Code [id number]  Phillips Grout, MD ED   07/24/2012 11:50 PM 07/27/2012  3:48 PM Full Code [id number]  Orvan Falconer, MD ED    Advance Directive Documentation        Most Recent Value   Type of Advance Directive  Living will, Healthcare Power of Attorney   Pre-existing out of facility DNR order (yellow form or pink MOST form)     "MOST" Form in Place?          IV Access:    Peripheral IV   Procedures and diagnostic studies:   Dg Swallowing Func-speech Pathology  02/17/2015  Objective Swallowing Evaluation:   Patient Details Name: [name] MRN: [id number] Date of Birth: [birth year] Today's Date: 02/17/2015 Time: SLP Start Time (ACUTE ONLY): 1320-SLP Stop Time (ACUTE ONLY): 1351 SLP Time Calculation (min) (ACUTE ONLY): 31 min Past Medical History: No past medical history on file. Past Surgical History: Past Surgical History Procedure Laterality Date . S/p bowel obstruction   . Abdominal hysterectomy   . Appendectomy   . Oophorectomy   . Tonsillectomy   . S/p right cea  2/09 . S/p right thyroid nodule biopsy  March 2011   negative . Carotid endarterectomy   . Cardiac catheterization   . Direct  laryngoscopy N/A 02/04/2015   Procedure: DIRECT LARYNGOSCOPY WITH BIOPSY OF NASOPHARYNGEAL MASS;  Surgeon: Rozetta Nunnery, MD;  Location: [facility];  Service: ENT;  Laterality: N/A; HPI: 75 [facility] with [facility] and dehydration.  PMH + for recently diagnosed nasopharyngeal mass, thyroid nodule - right, COPD, GERD, constipation, PVD, shortness of breath, ETOH and tobacco use.  Pt found to have mass involving posterior pharyngeal wall, skull base and CNS.  She reports problems swallowing pills and pain with swallowing.  MBS recommended by this  SLP due to pt's clear CN involvement.  Pt did not recalll having a throat mass per conversation with this SLP.   Subjective: pt awake in chair in flouro suite Assessment / Plan / Recommendation CHL IP CLINICAL IMPRESSIONS 02/17/2015 Therapy Diagnosis Severe cervical esophageal phase dysphagia;Severe pharyngeal phase dysphagia Clinical Impression Pt presents with severe pharyngeal and cervical esophageal dysphagia appearing obstructive in nature (suspect due to mass).  Very poor clearance of barium into esophagus with only liquids provided due to aspiration risk.  Pt requires 9 swallows to clear 90% of moderate sized bolus of nectar.  Pt with subsequent minimal laryngeal penetration and trace aspiration due to residuals from pyriform spilling into open larynx.  Cough did clear aspirates but risk is present.  Pt is high aspiration and malnutrition risk, note palliative referral pending.  Postural modifications considered but pt with pain with minimal neck manipulation and therefore not attempted- suspect secondary to bone mets.  Using live video, educated pt to findings/compensation strategies. Pt appeared confused as to her medical diagnosis and will benefit from reinforcement for compensation strategies.  Recommend continue liquids ONLY with strict aspiration precautions.  Impact on safety and function Risk for inadequate nutrition/hydration;Moderate aspiration risk   CHL IP TREATMENT RECOMMENDATION 02/17/2015 Treatment Recommendations Therapy as outlined in treatment plan below   Prognosis 02/17/2015 Prognosis for Safe Diet Advancement Guarded Barriers to Reach Goals Severity of deficits;Other (Comment) Barriers/Prognosis Comment -- CHL IP DIET RECOMMENDATION 02/17/2015 SLP Diet Recommendations Thin liquid;Nectar thick liquid Liquid Administration via Cup;Straw Medication Administration Via alternative means Compensations Slow rate;Small sips/bites;Multiple dry swallows after each bite/sip Postural Changes Remain  semi-upright after after feeds/meals (Comment);Seated upright at 90 degrees   CHL IP OTHER RECOMMENDATIONS 02/17/2015 Recommended Consults -- Oral Care Recommendations Oral care BID Other Recommendations Have oral suction available   CHL IP FOLLOW UP RECOMMENDATIONS 02/17/2015 Follow up Recommendations (No Data)   CHL IP FREQUENCY AND DURATION 02/17/2015 Speech Therapy Frequency (ACUTE ONLY) min 1 x/week Treatment Duration 1 week      CHL IP ORAL PHASE 02/17/2015 Oral Phase WFL Oral - Pudding Teaspoon -- Oral - Pudding Cup -- Oral - Honey Teaspoon -- Oral - Honey Cup -- Oral - Nectar Teaspoon -- Oral - Nectar Cup -- Oral - Nectar Straw -- Oral - Thin Teaspoon -- Oral - Thin Cup -- Oral - Thin Straw -- Oral - Puree -- Oral - Mech Soft -- Oral - Regular -- Oral - Multi-Consistency -- Oral - Pill -- Oral Phase - Comment --  CHL IP PHARYNGEAL PHASE 02/17/2015 Pharyngeal Phase Impaired Pharyngeal- Pudding Teaspoon -- Pharyngeal -- Pharyngeal- Pudding Cup -- Pharyngeal -- Pharyngeal- Honey Teaspoon -- Pharyngeal -- Pharyngeal- Honey Cup -- Pharyngeal -- Pharyngeal- Nectar Teaspoon Pharyngeal residue - pyriform;Pharyngeal residue - valleculae;Pharyngeal residue - cp segment Pharyngeal -- Pharyngeal- Nectar Cup Pharyngeal residue - valleculae;Pharyngeal residue - pyriform;Pharyngeal residue - cp segment;Trace aspiration Pharyngeal -- Pharyngeal- Nectar Straw -- Pharyngeal --  Pharyngeal- Thin Teaspoon Pharyngeal residue - valleculae;Pharyngeal residue - pyriform;Pharyngeal residue - cp segment Pharyngeal -- Pharyngeal- Thin Cup Pharyngeal residue - valleculae;Pharyngeal residue - pyriform;Pharyngeal residue - cp segment Pharyngeal -- Pharyngeal- Thin Straw -- Pharyngeal -- Pharyngeal- Puree -- Pharyngeal -- Pharyngeal- Mechanical Soft -- Pharyngeal -- Pharyngeal- Regular -- Pharyngeal -- Pharyngeal- Multi-consistency -- Pharyngeal -- Pharyngeal- Pill -- Pharyngeal -- Pharyngeal Comment pt does not sense residuals in  pharynx/cervical esophagus that appear to mix with secretions; pt required up to 9 swallows to clear very small amount of liquids, penetration of nectar noted with trace aspiration due to pyriform sinus residuals spilling into larynx  CHL IP CERVICAL ESOPHAGEAL PHASE 02/17/2015 Cervical Esophageal Phase Impaired Pudding Teaspoon -- Pudding Cup -- Honey Teaspoon -- Honey Cup -- Nectar Teaspoon -- Nectar Cup -- Nectar Straw -- Thin Teaspoon -- Thin Cup -- Thin Straw -- Puree -- Mechanical Soft -- Regular -- Multi-consistency -- Pill -- Cervical Esophageal Comment very poor clearance into esophagus with pt requiring MULTIPLE swallows across all consistencies - poor sensation to gross residuals Luanna Salk, [facility] SLP [phone]                Medical Consultants:    None.  Anti-Infectives:   Anti-infectives    Start     Dose/Rate Route Frequency Ordered Stop   02/17/15 1200  ampicillin-sulbactam (UNASYN) 1.5 g in sodium chloride 0.9 % 50 mL IVPB     1.5 g 100 mL/hr over 30 Minutes Intravenous Every 6 hours 02/17/15 1017     02/16/15 1530  piperacillin-tazobactam (ZOSYN) IVPB 3.375 g  Status:  Discontinued     3.375 g 12.5 mL/hr over 240 Minutes Intravenous Every 8 hours 02/16/15 1511 02/17/15 1017      Subjective:    [name] pain  well controlled. Had only a small amount of ensure.  Objective:    Filed Vitals:   02/18/15 0537 02/18/15 1302 02/18/15 1900 02/19/15 0551  BP: 158/90 121/55 136/54 128/60  Pulse: 73 69 71 77  Temp: 97.6 F (36.4 C) 98.8 F (37.1 C) 98.9 F (37.2 C) 98.5 F (36.9 C)  TempSrc: Oral Oral Oral Oral  Resp:  18 18 16   Height:      Weight: 42 kg (92 lb 9.5 oz)     SpO2: 97% 98% 96% 92%    Intake/Output Summary (Last 24 hours) at 02/19/15 1325 Last data filed at 02/19/15 0900  Gross per 24 hour  Intake    477 ml  Output    100 ml  Net    377 ml   Filed Weights   02/16/15 1306 02/17/15 0428 02/18/15 0537  Weight: 41.2 kg (90 lb 13.3 oz)  41.7 kg (91 lb 14.9 oz) 42 kg (92 lb 9.5 oz)    Exam: Gen:  NAD, cachectic Cardiovascular:  RRR. Chest and lungs:   CTAB Abdomen:  Abdomen soft, NT/ND, + BS Extremities:  No C/E/C   Data Reviewed:    Labs: Basic Metabolic Panel:  Recent Labs Lab 02/16/15 1133  02/16/15 1450 02/17/15 0420 02/18/15 1224  NA 138  --  144 138 138  K 2.7 Repeated and Verified*  < > 3.5 2.9* 3.8  CL  --   --  94* 93* 97*  CO2 33*  --  38* 34* 33*  GLUCOSE 191*  --  128* 116* 175*  BUN 18.7  --  18 14 12   CREATININE 0.9  --  0.81 0.64 0.55  CALCIUM  10.6*  --  10.0 9.0 8.9  MG 2.3  --  1.8  --   --   PHOS  --   --  3.0  --   --   < > = values in this interval not displayed. GFR Estimated Creatinine Clearance: 41.5 mL/min (by C-G formula based on Cr of 0.55). Liver Function Tests:  Recent Labs Lab 02/16/15 1133 02/16/15 1450 02/17/15 0420  AST 16 19 19   ALT 12 13* 14  ALKPHOS 110 92 89  BILITOT 0.66 0.5 1.0  PROT 8.3 7.9 6.8  ALBUMIN 3.1* 3.4* 3.0*   No results for input(s): LIPASE, AMYLASE in the last 168 hours. No results for input(s): AMMONIA in the last 168 hours. Coagulation profile  Recent Labs Lab 02/16/15 1450  INR 1.10    CBC:  Recent Labs Lab 02/16/15 1133 02/16/15 1450 02/17/15 0420  WBC 13.8* 12.5* 12.9*  NEUTROABS 12.2* 10.2*  --   HGB 14.0 13.3 12.3  HCT 43.2 42.1 38.8  MCV 84.0 88.4 86.4  PLT 248 239 248   Cardiac Enzymes: No results for input(s): CKTOTAL, CKMB, CKMBINDEX, TROPONINI in the last 168 hours. BNP (last 3 results) No results for input(s): PROBNP in the last 8760 hours. CBG:  Recent Labs Lab 02/14/15 1421 02/17/15 0750 02/18/15 0755 02/19/15 0606 02/19/15 0738  GLUCAP 122* 119* 88 145* 134*   D-Dimer: No results for input(s): DDIMER in the last 72 hours. Hgb A1c: No results for input(s): HGBA1C in the last 72 hours. Lipid Profile: No results for input(s): CHOL, HDL, LDLCALC, TRIG, CHOLHDL, LDLDIRECT in the last 72  hours. Thyroid function studies: No results for input(s): TSH, T4TOTAL, T3FREE, THYROIDAB in the last 72 hours.  Invalid input(s): FREET3 Anemia work up: No results for input(s): VITAMINB12, FOLATE, FERRITIN, TIBC, IRON, RETICCTPCT in the last 72 hours. Sepsis Labs:  Recent Labs Lab 02/16/15 1133 02/16/15 1450 02/17/15 0420  WBC 13.8* 12.5* 12.9*   Microbiology No results found for this or any previous visit (from the past 240 hour(s)).   Medications:   . ampicillin-sulbactam (UNASYN) IV  1.5 g Intravenous Q6H  . aspirin EC  81 mg Oral Daily  . atenolol  50 mg Oral Daily  . chlorhexidine  15 mL Mouth/Throat BID  . feeding supplement (ENSURE ENLIVE)  237 mL Oral TID BM  . lidocaine  1 patch Transdermal Q24H  . lidocaine  15 mL Mouth/Throat TID AC & HS  . nicotine  14 mg Transdermal Daily  . senna-docusate  1 tablet Oral BID   Continuous Infusions: . sodium chloride 75 mL/hr at 02/19/15 0801    Time spent: 15 min   LOS: 3 days   Charlynne Cousins  [facility] Pager [phone]  *Please refer to [email or web address], password [email or web address] to get updated schedule on who will round on this patient, as hospitalists switch teams weekly. If 7PM-7AM, please contact night-coverage at [email or web address], password [email or web address] for any overnight needs.  02/19/2015, 1:25 PM

## 2015-02-20 LAB — URINE CULTURE

## 2015-02-20 LAB — GLUCOSE, CAPILLARY: GLUCOSE-CAPILLARY: 84 mg/dL (ref 65–99)

## 2015-02-20 MED ORDER — AMOXICILLIN-POT CLAVULANATE 600-42.9 MG/5ML PO SUSR: 875.0000 mg | Freq: Two times a day (BID) | ORAL | Status: DC

## 2015-02-20 MED ORDER — AMOXICILLIN-POT CLAVULANATE 875-125 MG PO TABS: 1.0000 | ORAL_TABLET | Freq: Two times a day (BID) | ORAL | Status: AC

## 2015-02-20 MED ORDER — MORPHINE SULFATE (CONCENTRATE) 10 MG/0.5ML PO SOLN: 5.0000 mg | ORAL | Status: AC | PRN

## 2015-02-20 NOTE — Discharge Summary (Addendum)
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)   Cancer associated pain   FTT (failure to thrive) in adult   Hypokalemia   Leukocytosis   Hypercalcemia   Essential hypertension, benign   Dyslipidemia   Palliative care encounter   DNR (do not resuscitate)   Chronic periodontitis   Nasopharyngeal cancer [facility])   Discharge Condition: guarded  Diet recommendation: regular  Filed Weights   02/16/15 1306 02/17/15 0428 02/18/15 0537  Weight: 41.2 kg (90 lb 13.3 oz) 41.7 kg (91 lb 14.9 oz) 42 kg (92 lb 9.5 oz)    History of present illness:  75 year old with past medical history of a pharyngeal carcinoma who presented to the [facility] and day of admission and she was not feeling well and coughing up patient relates she has been having difficulty and pain with swallowing.  Hospital Course:  Aspiration pneumonia with carcinoma of the posterior wall of the nasopharynx: MBBS done they recommended thin liquid diet patient remained afebrile. She was treated with 2 days of IV Unasyn, then switch to liquid Augmentin which she will continue for 2 days. The patient is a DNR/DNI she was started on Ativan and Roxanol for pain here It was discussed with the family and they refuse radiation therapy. They would like to move towards comfort care. As the patient has not been able to tolerate a diet in the hospital she would be a good candidate hospice facility. Palliative Care was consulted which agreed.  Severe protein caloric malnutrition Cancer associated pain continue oral  narcotics.  Hypokalemia: Does resolve this probably due to dehydration.  Metabolic alkalosis: Likely contraction alkalosis resolved with IV fluids.  Essential hypertension: All of her antihypertensive medications were discontinued.  Procedures:  CXR  Consultations:  PMT  Discharge Exam: Filed Vitals:   02/20/15 0547 02/20/15 1100  BP: 143/66 143/61  Pulse: 71 69  Temp: 98.1 F (36.7 C)   Resp: 16     General: A&O x3 Cardiovascular: RRR Respiratory: good air movement CTA B/L  Discharge Instructions   Discharge Instructions    Diet - low sodium heart healthy    Complete by:  As directed      Increase activity slowly    Complete by:  As directed           Current Discharge Medication List    START taking these medications   Details  amoxicillin-clavulanate (AUGMENTIN) 875-125 MG tablet Take 1 tablet by mouth 2 (two) times daily. Qty: 4 tablet, Refills: 0    Morphine Sulfate (MORPHINE CONCENTRATE) 10 MG/0.5ML SOLN concentrated solution Take 0.25-0.5 mLs (5-10 mg total) by mouth every 2 (two) hours as needed for moderate pain. Qty: 42 mL, Refills: 0      CONTINUE these medications which have NOT CHANGED   Details  aspirin 81 MG tablet Take 81 mg by mouth daily. Reported on 02/16/2015    atenolol (TENORMIN) 50 MG tablet Take 1 tablet (50 mg total) by mouth daily. Qty: 90 tablet, Refills: 3    atorvastatin (LIPITOR) 20 MG tablet Take 1 tablet (20 mg total) by mouth daily at 6 PM. ---  patient needs office visit before any further refills Qty: 90 tablet, Refills: 0      STOP taking these medications     amLODipine (NORVASC) 10 MG tablet      HYDROcodone-acetaminophen (NORCO/VICODIN) 5-325 MG tablet        Allergies  Allergen Reactions  . Tramadol Other (See Comments)    constipation   Follow-up Information    Follow up with Hospice at [facility]   Specialty:  Hospice and Palliative Medicine   Contact information:   [address]  [place] [address] [phone]        The results of significant diagnostics from this hospitalization (including imaging, microbiology, ancillary and laboratory) are listed below for reference.    Significant Diagnostic Studies: Mr Herby  Contrast  01/27/2015  ADDENDUM REPORT: 01/27/2015 17:18 ADDENDUM: Study discussed by telephone with Dr. Cathlean Cower on 01/27/2015 at 1714 hours. He advised there is no known malignancy. I recommended ENT consultation, as they are probably best suited to biopsy the lesion. Electronically Signed   By: Genevie Ann M.D.   On: 01/27/2015 17:18  01/27/2015  CLINICAL DATA:  75 year old female with headaches. Worsening memory loss and confusion. No known injury. Initial encounter. EXAM: MRI HEAD WITHOUT CONTRAST TECHNIQUE: Multiplanar, multiecho pulse sequences of the brain and surrounding structures were obtained without intravenous contrast. COMPARISON:  Head CT without contrast 09/15/2013. Brain MRI 06/04/2011. FINDINGS: New since 2013, and also not evident on the 2015 comparison, is a destructive soft tissue mass at the central skullbase which has completely replaced the clivus and infiltrates bilaterally anteriorly and inferiorly. This T1 hypo intense mass tracks into the occipital condyles, more so the right. The bilateral hypoglossal canals are are obliterated. Tumor infiltrates into the nasopharynx, prevertebral and retropharyngeal spaces, and into the superior pterygoids. The floor of the sella is affected but the cavernous sinuses appear spared at this time. However, left Meckel cave is completely infiltrated as is the proximal course of the left V3 trunk. Some of the C1 vertebra is affected anteriorly on the right. Overall the mass encompasses 60 x 60 x 47 mm (AP by transverse by CC). The mass surrounds the left ICA siphon vertical and horizontal petrous segments and significantly narrows the vessel (series 16, image 18). Small volume of superimposed fluid in the  sphenoid sinuses which are partially infiltrated. Surprisingly, no middle ear or mastoid fluid. The superior parapharyngeal spaces are relatively spared. Despite the above Major intracranial vascular flow voids are preserved. No restricted diffusion or evidence of acute infarction. No cerebral edema identified. No ventriculomegaly or acute intracranial hemorrhage. Chronic cerebral white matter signal changes and generalized cerebral volume loss have mildly progressed. Bone marrow signal throughout the calvarium a an below the odontoid process remains normal. Suspicious right level 2 lymph node enlargement (series 9, image 45). Negative scalp and orbits soft tissues. IMPRESSION: 1. Large, destructive, and infiltrative skullbase mass new since 2013 and also not evident on a 2015 head CT. Top differential considerations include Nasopharyngeal Carcinoma, Lymphoma, and Metastasis. 2. Enlarged right level 2 cervical lymph node suspicious for nodal metastasis. 3. Bilateral cranial nerve involvement and narrowing of the left ICA petrous segment which is engulfed. However, no cerebral edema, acute infarct, or definite brain metastasis on this noncontrast exam. Electronically Signed: By: Genevie Ann M.D. On: 01/27/2015 17:14   Mr Jeri Cos [id number] Contrast  02/16/2015  CLINICAL DATA:  Squamous cell carcinoma of the nasopharynx, with skullbase involvement. Subsequent encounter. EXAM: MRI HEAD WITHOUT AND  WITH CONTRAST; MR NECK SOFT TISSUE ONLY WITHOUT AND WITH CONTRAST TECHNIQUE: Multiplanar, multiecho pulse sequences of the brain and surrounding structures were obtained according to standard protocol without and with intravenous contrast; Multiplanar, multisequence MR imaging of the neck was performed both before and after administration of intravenous contrast. CONTRAST:  8 mL COMPARISON:  01/27/2015 MRI brain. FINDINGS: There is considerable motion degradation, despite best efforts of the technologist to emphasize the importance of  holding still. The study is marginally diagnostic. MR BRAIN: Progression of tumor since the previous study. The clivus is destroyed as is the floor of the sella. The cavernous sinuses bulge mildly, more so on the LEFT, consistent with tumor infiltration. Abnormal enhancement is now seen along the floor of the LEFT middle cranial fossa and tumor likely extends anteriorly to the BILATERAL pterygopalatine fossae. Tumor destruction of the clivus has resulted in dural thickening anterior to the pons continuous with tumor in the upper cervical region. Tumor surrounds the skullbase LEFT internal carotid artery but there is no vascular occlusion. Tumor also surrounds both vertebral arteries, greater on the RIGHT. No acute stroke is evident. No intra-axial brain metastases are seen. Generalized atrophy with chronic microvascular ischemic changes are stable. Chronic LEFT sphenoid sinus disease. No orbital masses. No mastoid fluid. 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 The lesion has grown since the previous study, and measures approximately 62 x 45 x 65 mm (R-L x A-P x C-C). There is destruction of the clivus, as well as anterior arch of C1. Abnormal enhancement can be seen in the ventral epidural space anterior to C1 and C2. Lateral mass of C1 on the RIGHT is invaded. BILATERAL hypoglossal canals cannot be visualized. Tumor involves the parapharyngeal spaces, retropharyngeal space, and extends superiorly where it is inseparable from the pterygoids. There may be BILATERAL level II lymph nodes incompletely evaluated. Cervical spondylosis. IMPRESSION: Severely motion degraded study demonstrating progression of squamous cell carcinoma of the nasopharynx, with intracranial disease, most notably in the LEFT middle cranial fossa and BILATERAL LEFT greater than RIGHT cavernous sinuses. Skullbase destruction, with enhancement of the dura in the pre pontine cistern,  continuous with epidural tumor extending into the upper cervical region this far caudally as C2. Overall the tumor estimated size is increased, measuring 62 x 45 x 65 mm. Osseous involvement of C1. Bulky nasopharyngeal disease extends into the parapharyngeal, retropharyngeal, and pterygoid masticator spaces. If further delineation desired, and the patient is able to hold still, CT neck with contrast may provide better treatment planning. Electronically Signed   By: Staci Righter M.D.   On: 02/16/2015 20:41   Mr Neck Soft Tissue Only W Wo Contrast  02/16/2015  CLINICAL DATA:  Squamous cell carcinoma of the nasopharynx, with skullbase involvement. Subsequent encounter. EXAM: MRI HEAD WITHOUT AND WITH CONTRAST; MR NECK SOFT TISSUE ONLY WITHOUT AND WITH CONTRAST TECHNIQUE: Multiplanar, multiecho pulse sequences of the brain and surrounding structures were obtained according to standard protocol without and with intravenous contrast; Multiplanar, multisequence MR imaging of the neck was performed both before and after administration of intravenous contrast. CONTRAST:  8 mL COMPARISON:  01/27/2015 MRI brain. FINDINGS: There is considerable motion degradation, despite best efforts of the technologist to emphasize the importance of holding still. The study is marginally diagnostic. MR BRAIN: Progression of tumor since the previous study. The clivus is destroyed as is the floor of the sella. The cavernous sinuses bulge mildly, more so on the LEFT, consistent with  tumor infiltration. Abnormal enhancement is now seen along the floor of the LEFT middle cranial fossa and tumor likely extends anteriorly to the BILATERAL pterygopalatine fossae. Tumor destruction of the clivus has resulted in dural thickening anterior to the pons continuous with tumor in the upper cervical region. Tumor surrounds the skullbase LEFT internal carotid artery but there is no vascular occlusion. Tumor also surrounds both vertebral arteries, greater  on the RIGHT. No acute stroke is evident. No intra-axial brain metastases are seen. Generalized atrophy with chronic microvascular ischemic changes are stable. Chronic LEFT sphenoid sinus disease. No orbital masses. No mastoid fluid. 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 The lesion has grown since the previous study, and measures approximately 62 x 45 x 65 mm (R-L x A-P x C-C). There is destruction of the clivus, as well as anterior arch of C1. Abnormal enhancement can be seen in the ventral epidural space anterior to C1 and C2. Lateral mass of C1 on the RIGHT is invaded. BILATERAL hypoglossal canals cannot be visualized. Tumor involves the parapharyngeal spaces, retropharyngeal space, and extends superiorly where it is inseparable from the pterygoids. There may be BILATERAL level II lymph nodes incompletely evaluated. Cervical spondylosis. IMPRESSION: Severely motion degraded study demonstrating progression of squamous cell carcinoma of the nasopharynx, with intracranial disease, most notably in the LEFT middle cranial fossa and BILATERAL LEFT greater than RIGHT cavernous sinuses. Skullbase destruction, with enhancement of the dura in the pre pontine cistern, continuous with epidural tumor extending into the upper cervical region this far caudally as C2. Overall the tumor estimated size is increased, measuring 62 x 45 x 65 mm. Osseous involvement of C1. Bulky nasopharyngeal disease extends into the parapharyngeal, retropharyngeal, and pterygoid masticator spaces. If further delineation desired, and the patient is able to hold still, CT neck with contrast may provide better treatment planning. Electronically Signed   By: Staci Righter M.D.   On: 02/16/2015 20:41   Nm Pet Image Initial (pi) Skull Base To Thigh  02/14/2015  CLINICAL DATA:  Initial treatment strategy for malignant neoplasm of posterior nasopharyngeal wall. EXAM: NUCLEAR MEDICINE PET  SKULL BASE TO THIGH TECHNIQUE: 5.6 mCi F-18 FDG was injected intravenously. Full-ring PET imaging was performed from the skull base to thigh after the radiotracer. CT data was obtained and used for attenuation correction and anatomic localization. FASTING BLOOD GLUCOSE:  Value: 122 mg/dl COMPARISON:  None. FINDINGS: NECK The borders of a hypermetabolic nasopharyngeal mass are somewhat difficult to definitively determine without IV contrast. Mass measures approximately 5.7 x 6.7 cm, erodes the skull base and narrows the airway to a diameter of 10 mm. Corresponding SUV max is 20.5. Right level 2 lymph node measures approximately 6 mm in short axis (CT image 16) with an SUV max of 7.8. No additional hypermetabolic lymph nodes in the neck. CT images show no acute findings. CHEST No hypermetabolic mediastinal, hilar or axillary lymph nodes. There is mildly hypermetabolic peribronchovascular ground-glass nodularity in the lingula and left lower lobe. No hypermetabolic pulmonary nodules. CT images show three-vessel coronary artery calcification. No pericardial or pleural effusion. Mild centrilobular emphysema. ABDOMEN/PELVIS No abnormal hypermetabolism in the liver, adrenal glands, spleen or pancreas. No hypermetabolic lymph nodes. CT images show the liver, gallbladder, adrenal glands, kidneys, spleen, pancreas, stomach and bowel to be grossly unremarkable. Stool is seen throughout the colon. SKELETON No abnormal osseous hypermetabolism. IMPRESSION: 1. Large hypermetabolic nasopharyngeal mass and hypermetabolic right level 2 lymph node. Associated skull base erosion  and airway narrowing. No evidence of distant metastatic disease. 2. Mildly hypermetabolic peribronchovascular ground-glass nodularity in the lingula and left lower lobe, most likely due to bronchopneumonia. 3. Three-vessel coronary artery calcification. Electronically Signed   By: Lorin Picket M.D.   On: 02/14/2015 16:33   Dg Chest Port 1 View  02/16/2015   CLINICAL DATA:  Leukocytosis EXAM: PORTABLE CHEST 1 VIEW COMPARISON:  10/07/2013 FINDINGS: Mild hyperinflation. Heart and mediastinal contours are within normal limits. No focal opacities or effusions. No acute bony abnormality. IMPRESSION: Hyperinflation compatible with COPD.  No active disease. Electronically Signed   By: Rolm Baptise M.D.   On: 02/16/2015 15:44   Dg Swallowing Func-speech Pathology  02/17/2015  Objective Swallowing Evaluation:   Patient Details Name: [name] MRN: [id number] Date of Birth: [birth year] Today's Date: 02/17/2015 Time: SLP Start Time (ACUTE ONLY): 1320-SLP Stop Time (ACUTE ONLY): 1351 SLP Time Calculation (min) (ACUTE ONLY): 31 min Past Medical History: No past medical history on file. Past Surgical History: Past Surgical History Procedure Laterality Date . S/p bowel obstruction   . Abdominal hysterectomy   . Appendectomy   . Oophorectomy   . Tonsillectomy   . S/p right cea  2/09 . S/p right thyroid nodule biopsy  March 2011   negative . Carotid endarterectomy   . Cardiac catheterization   . Direct laryngoscopy N/A 02/04/2015   Procedure: DIRECT LARYNGOSCOPY WITH BIOPSY OF NASOPHARYNGEAL MASS;  Surgeon: Rozetta Nunnery, MD;  Location: [facility];  Service: ENT;  Laterality: N/A; HPI: 75 yo female adm to [facility] from cancer center with FTT and dehydration.  PMH + for recently diagnosed nasopharyngeal mass, thyroid nodule - right, COPD, GERD, constipation, PVD, shortness of breath, ETOH and tobacco use.  Pt found to have mass involving posterior pharyngeal wall, skull base and CNS.  She reports problems swallowing pills and pain with swallowing.  MBS recommended by this SLP due to pt's clear CN involvement.  Pt did not recalll having a throat mass per conversation with this SLP.   Subjective: pt awake in chair in flouro suite Assessment / Plan / Recommendation CHL IP CLINICAL IMPRESSIONS 02/17/2015 Therapy Diagnosis Severe cervical esophageal phase dysphagia;Severe  pharyngeal phase dysphagia Clinical Impression Pt presents with severe pharyngeal and cervical esophageal dysphagia appearing obstructive in nature (suspect due to mass).  Very poor clearance of barium into esophagus with only liquids provided due to aspiration risk.  Pt requires 9 swallows to clear 90% of moderate sized bolus of nectar.  Pt with subsequent minimal laryngeal penetration and trace aspiration due to residuals from pyriform spilling into open larynx.  Cough did clear aspirates but risk is present.  Pt is high aspiration and malnutrition risk, note palliative referral pending.  Postural modifications considered but pt with pain with minimal neck manipulation and therefore not attempted- suspect secondary to bone mets.  Using live video, educated pt to findings/compensation strategies. Pt appeared confused as to her medical diagnosis and will benefit from reinforcement for compensation strategies.  Recommend continue liquids ONLY with strict aspiration precautions.  Impact on safety and function Risk for inadequate nutrition/hydration;Moderate aspiration risk   CHL IP TREATMENT RECOMMENDATION 02/17/2015 Treatment Recommendations Therapy as outlined in treatment plan below   Prognosis 02/17/2015 Prognosis for Safe Diet Advancement Guarded Barriers to Reach Goals Severity of deficits;Other (Comment) Barriers/Prognosis Comment -- CHL IP DIET RECOMMENDATION 02/17/2015 SLP Diet Recommendations Thin liquid;Nectar thick liquid Liquid Administration via Cup;Straw Medication Administration Via alternative means Compensations Slow rate;Small sips/bites;Multiple dry  swallows after each bite/sip Postural Changes Remain semi-upright after after feeds/meals (Comment);Seated upright at 90 degrees   CHL IP OTHER RECOMMENDATIONS 02/17/2015 Recommended Consults -- Oral Care Recommendations Oral care BID Other Recommendations Have oral suction available   CHL IP FOLLOW UP RECOMMENDATIONS 02/17/2015 Follow up Recommendations (No  Data)   CHL IP FREQUENCY AND DURATION 02/17/2015 Speech Therapy Frequency (ACUTE ONLY) min 1 x/week Treatment Duration 1 week      CHL IP ORAL PHASE 02/17/2015 Oral Phase WFL Oral - Pudding Teaspoon -- Oral - Pudding Cup -- Oral - Honey Teaspoon -- Oral - Honey Cup -- Oral - Nectar Teaspoon -- Oral - Nectar Cup -- Oral - Nectar Straw -- Oral - Thin Teaspoon -- Oral - Thin Cup -- Oral - Thin Straw -- Oral - Puree -- Oral - Mech Soft -- Oral - Regular -- Oral - Multi-Consistency -- Oral - Pill -- Oral Phase - Comment --  CHL IP PHARYNGEAL PHASE 02/17/2015 Pharyngeal Phase Impaired Pharyngeal- Pudding Teaspoon -- Pharyngeal -- Pharyngeal- Pudding Cup -- Pharyngeal -- Pharyngeal- Honey Teaspoon -- Pharyngeal -- Pharyngeal- Honey Cup -- Pharyngeal -- Pharyngeal- Nectar Teaspoon Pharyngeal residue - pyriform;Pharyngeal residue - valleculae;Pharyngeal residue - cp segment Pharyngeal -- Pharyngeal- Nectar Cup Pharyngeal residue - valleculae;Pharyngeal residue - pyriform;Pharyngeal residue - cp segment;Trace aspiration Pharyngeal -- Pharyngeal- Nectar Straw -- Pharyngeal -- Pharyngeal- Thin Teaspoon Pharyngeal residue - valleculae;Pharyngeal residue - pyriform;Pharyngeal residue - cp segment Pharyngeal -- Pharyngeal- Thin Cup Pharyngeal residue - valleculae;Pharyngeal residue - pyriform;Pharyngeal residue - cp segment Pharyngeal -- Pharyngeal- Thin Straw -- Pharyngeal -- Pharyngeal- Puree -- Pharyngeal -- Pharyngeal- Mechanical Soft -- Pharyngeal -- Pharyngeal- Regular -- Pharyngeal -- Pharyngeal- Multi-consistency -- Pharyngeal -- Pharyngeal- Pill -- Pharyngeal -- Pharyngeal Comment pt does not sense residuals in pharynx/cervical esophagus that appear to mix with secretions; pt required up to 9 swallows to clear very small amount of liquids, penetration of nectar noted with trace aspiration due to pyriform sinus residuals spilling into larynx  CHL IP CERVICAL ESOPHAGEAL PHASE 02/17/2015 Cervical Esophageal Phase Impaired  Pudding Teaspoon -- Pudding Cup -- Honey Teaspoon -- Honey Cup -- Nectar Teaspoon -- Nectar Cup -- Nectar Straw -- Thin Teaspoon -- Thin Cup -- Thin Straw -- Puree -- Mechanical Soft -- Regular -- Multi-consistency -- Pill -- Cervical Esophageal Comment very poor clearance into esophagus with pt requiring MULTIPLE swallows across all consistencies - poor sensation to gross residuals Luanna Salk, [place] [facility] SLP [phone]               Microbiology: Recent Results (from the past 240 hour(s))  Culture, Urine     Status: None   Collection Time: 02/19/15  8:00 AM  Result Value Ref Range Status   Specimen Description URINE, CLEAN CATCH  Final   Special Requests NONE  Final   Culture   Final    4,000 COLONIES/mL INSIGNIFICANT GROWTH Performed at [facility]    Report Status 02/20/2015 FINAL  Final     Labs: Basic Metabolic Panel:  Recent Labs Lab 02/16/15 1133 02/16/15 1450 02/17/15 0420 02/18/15 1224  NA 138 144 138 138  K 2.7 Repeated and Verified* 3.5 2.9* 3.8  CL  --  94* 93* 97*  CO2 33* 38* 34* 33*  GLUCOSE 191* 128* 116* 175*  BUN 18.7 18 14 12   CREATININE 0.9 0.81 0.64 0.55  CALCIUM 10.6* 10.0 9.0 8.9  MG 2.3 1.8  --   --   PHOS  --  3.0  --   --  Liver Function Tests:  Recent Labs Lab 02/16/15 1133 02/16/15 1450 02/17/15 0420  AST 16 19 19   ALT 12 13* 14  ALKPHOS 110 92 89  BILITOT 0.66 0.5 1.0  PROT 8.3 7.9 6.8  ALBUMIN 3.1* 3.4* 3.0*   No results for input(s): LIPASE, AMYLASE in the last 168 hours. No results for input(s): AMMONIA in the last 168 hours. CBC:  Recent Labs Lab 02/16/15 1133 02/16/15 1450 02/17/15 0420  WBC 13.8* 12.5* 12.9*  NEUTROABS 12.2* 10.2*  --   HGB 14.0 13.3 12.3  HCT 43.2 42.1 38.8  MCV 84.0 88.4 86.4  PLT 248 239 248   Cardiac Enzymes: No results for input(s): CKTOTAL, CKMB, CKMBINDEX, TROPONINI in the last 168 hours. BNP: BNP (last 3 results) No results for input(s): BNP in the last 8760 hours.  ProBNP  (last 3 results) No results for input(s): PROBNP in the last 8760 hours.  CBG:  Recent Labs Lab 02/17/15 0750 02/18/15 0755 02/19/15 0606 02/19/15 0738 02/20/15 0750  GLUCAP 119* 88 145* 134* 84       Signed:  Charlynne Cousins MD.  [facility] 02/20/2015, 2:29 PM

## 2015-02-20 NOTE — Progress Notes (Signed)
Pt transferred to: [facility] Anticipated date of transfer:  02/20/15 Transported by: Ambulance Time Tentatively Scheduled for: 3:30 PM Family notified: Granddaugthers [name] and Janey Genta Report #:[id number]   DC summary has been sent to facility by [facility] Liaison.  Granddaughters are agreeable to d/c today. No further CSW needs identified.   CSW signing off.  Kendell Bane, LCSW [phone]

## 2015-02-20 NOTE — Progress Notes (Signed)
[facility] transporting pt to [facility]. IV dc'd. Granddaughter at bedside

## 2015-02-20 NOTE — Progress Notes (Signed)
Called to give report to [place] at [facility], unable to take report at that time

## 2015-02-20 NOTE — Progress Notes (Addendum)
Pt getting out of bed and urinating in the basket, so not to bother anyone. Pt instructed to use call light so someone cab help her to the bedside commode. Grand daughter at bedside.

## 2015-02-20 NOTE — Progress Notes (Signed)
Chaplain visited as the result of an active [place] in her chart. The consult indicates "Patient would enjoy a visit from the [place]. Her family says she is "somewhat" religious."  Ms Shillinglaw did not wish to talk at this time. She said the problems she was having earlier were better, She did not wish to expand on this statement. She indicated she would have a chaplain to be paged should she desire to have further spiritual care.  Follow up as paged or as needed by paging a chaplain  Sallee Lange. Philippe Gang, DMin, MDiv, MA Chaplain

## 2015-02-20 NOTE — Consult Note (Signed)
[facility] Liaison: [facility] room available for patient today. Discharge summary and scripts have been faxed.  RN please call report to [phone].  Per CSW, Corey Harold has been called.   Thank you.  Erling Conte, [place]

## 2015-02-21 ENCOUNTER — Telehealth: Payer: Self-pay | Admitting: *Deleted

## 2015-02-21 ENCOUNTER — Ambulatory Visit: Payer: Medicare Other | Admitting: Neurology

## 2015-02-21 NOTE — Telephone Encounter (Signed)
Pt was on TCM list admitted for memory loss. Pt was d/c 02/20/15 and sent to [facility] for comfort care...Johny Chess

## 2015-02-22 ENCOUNTER — Encounter (HOSPITAL_COMMUNITY): Payer: Medicare Other

## 2015-02-23 ENCOUNTER — Other Ambulatory Visit: Payer: Medicare Other

## 2015-02-28 ENCOUNTER — Ambulatory Visit: Payer: Medicare Other

## 2015-02-28 ENCOUNTER — Encounter: Payer: Medicare Other | Admitting: Nutrition

## 2015-03-01 ENCOUNTER — Ambulatory Visit: Payer: Medicare Other | Admitting: Physical Therapy

## 2015-03-02 DEATH — deceased

## 2015-03-23 ENCOUNTER — Ambulatory Visit: Payer: Medicare Other | Admitting: Neurology

## 2015-03-28 ENCOUNTER — Other Ambulatory Visit: Payer: Self-pay | Admitting: Internal Medicine

## 2015-04-23 ENCOUNTER — Other Ambulatory Visit: Payer: Self-pay | Admitting: Internal Medicine
